# Patient Record
Sex: Female | Born: 1965 | Race: White | Hispanic: No | Smoking: Former smoker
Health system: Southern US, Community
[De-identification: ages and names within clinical notes are randomized; demographics above are authoritative.]

## PROBLEM LIST (undated history)

## (undated) DIAGNOSIS — K5900 Constipation, unspecified: Secondary | ICD-10-CM

## (undated) DIAGNOSIS — M1990 Unspecified osteoarthritis, unspecified site: Secondary | ICD-10-CM

## (undated) DIAGNOSIS — Z87442 Personal history of urinary calculi: Secondary | ICD-10-CM

## (undated) DIAGNOSIS — F419 Anxiety disorder, unspecified: Secondary | ICD-10-CM

## (undated) DIAGNOSIS — M549 Dorsalgia, unspecified: Secondary | ICD-10-CM

## (undated) DIAGNOSIS — M5412 Radiculopathy, cervical region: Secondary | ICD-10-CM

## (undated) DIAGNOSIS — I209 Angina pectoris, unspecified: Secondary | ICD-10-CM

## (undated) DIAGNOSIS — I340 Nonrheumatic mitral (valve) insufficiency: Secondary | ICD-10-CM

## (undated) DIAGNOSIS — K219 Gastro-esophageal reflux disease without esophagitis: Secondary | ICD-10-CM

## (undated) DIAGNOSIS — R569 Unspecified convulsions: Secondary | ICD-10-CM

## (undated) DIAGNOSIS — R072 Precordial pain: Secondary | ICD-10-CM

## (undated) DIAGNOSIS — B351 Tinea unguium: Secondary | ICD-10-CM

## (undated) DIAGNOSIS — N393 Stress incontinence (female) (male): Secondary | ICD-10-CM

## (undated) DIAGNOSIS — J309 Allergic rhinitis, unspecified: Secondary | ICD-10-CM

## (undated) DIAGNOSIS — R32 Unspecified urinary incontinence: Secondary | ICD-10-CM

## (undated) DIAGNOSIS — M722 Plantar fascial fibromatosis: Secondary | ICD-10-CM

## (undated) DIAGNOSIS — F32A Depression, unspecified: Secondary | ICD-10-CM

## (undated) DIAGNOSIS — M5030 Other cervical disc degeneration, unspecified cervical region: Secondary | ICD-10-CM

## (undated) DIAGNOSIS — F410 Panic disorder [episodic paroxysmal anxiety] without agoraphobia: Secondary | ICD-10-CM

## (undated) HISTORY — PX: TONSILLECTOMY: SUR1361

## (undated) HISTORY — PX: APPENDECTOMY: SHX54

## (undated) HISTORY — DX: Nonrheumatic mitral (valve) insufficiency: I34.0

## (undated) HISTORY — DX: Precordial pain: R07.2

## (undated) HISTORY — PX: OTHER SURGICAL HISTORY: SHX169

---
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
## 2001-08-18 HISTORY — PX: TUBAL LIGATION: SHX77

## 2004-11-14 ENCOUNTER — Ambulatory Visit: Payer: Self-pay | Admitting: Family Medicine

## 2004-12-18 ENCOUNTER — Emergency Department: Payer: Self-pay | Admitting: Unknown Physician Specialty

## 2005-12-25 ENCOUNTER — Ambulatory Visit: Payer: Self-pay | Admitting: Family Medicine

## 2007-02-23 ENCOUNTER — Ambulatory Visit: Payer: Self-pay | Admitting: Family Medicine

## 2008-02-21 DIAGNOSIS — J309 Allergic rhinitis, unspecified: Secondary | ICD-10-CM | POA: Insufficient documentation

## 2008-05-01 ENCOUNTER — Emergency Department: Payer: Self-pay | Admitting: Emergency Medicine

## 2009-09-20 ENCOUNTER — Emergency Department: Payer: Self-pay | Admitting: Emergency Medicine

## 2011-12-09 DIAGNOSIS — G40209 Localization-related (focal) (partial) symptomatic epilepsy and epileptic syndromes with complex partial seizures, not intractable, without status epilepticus: Secondary | ICD-10-CM | POA: Insufficient documentation

## 2013-02-03 ENCOUNTER — Ambulatory Visit: Payer: Self-pay

## 2013-12-29 DIAGNOSIS — M179 Osteoarthritis of knee, unspecified: Secondary | ICD-10-CM | POA: Insufficient documentation

## 2013-12-29 DIAGNOSIS — M1710 Unilateral primary osteoarthritis, unspecified knee: Secondary | ICD-10-CM | POA: Insufficient documentation

## 2014-03-18 ENCOUNTER — Emergency Department: Payer: Self-pay | Admitting: Emergency Medicine

## 2014-03-18 LAB — URINALYSIS, COMPLETE
BILIRUBIN, UR: NEGATIVE
BLOOD: NEGATIVE
Glucose,UR: NEGATIVE mg/dL (ref 0–75)
KETONE: NEGATIVE
Nitrite: NEGATIVE
PROTEIN: NEGATIVE
Ph: 5 (ref 4.5–8.0)
RBC,UR: 2 /HPF (ref 0–5)
Specific Gravity: 1.008 (ref 1.003–1.030)

## 2014-08-24 ENCOUNTER — Ambulatory Visit: Payer: Self-pay | Admitting: Family Medicine

## 2014-11-04 ENCOUNTER — Emergency Department: Payer: Self-pay | Admitting: Family Medicine

## 2014-11-12 ENCOUNTER — Emergency Department: Payer: Self-pay | Admitting: Internal Medicine

## 2014-11-25 ENCOUNTER — Emergency Department
Admit: 2014-11-25 | Disposition: A | Discharge status: Home / Self Care | Payer: Self-pay | Admitting: Physician Assistant

## 2014-11-25 LAB — CBC
HCT: 38.2 % (ref 35.0–47.0)
HGB: 12.5 g/dL (ref 12.0–16.0)
MCH: 29.5 pg (ref 26.0–34.0)
MCHC: 32.8 g/dL (ref 32.0–36.0)
MCV: 90 fL (ref 80–100)
PLATELETS: 326 10*3/uL (ref 150–440)
RBC: 4.25 10*6/uL (ref 3.80–5.20)
RDW: 13.3 % (ref 11.5–14.5)
WBC: 9.9 10*3/uL (ref 3.6–11.0)

## 2014-11-25 LAB — BASIC METABOLIC PANEL WITH GFR
Anion Gap: 6 — ABNORMAL LOW
BUN: 15 mg/dL
Calcium, Total: 9 mg/dL
Chloride: 109 mmol/L
Co2: 25 mmol/L
Creatinine: 0.89 mg/dL
EGFR (African American): >60
EGFR (Non-African Amer.): >60
Glucose: 113 mg/dL — ABNORMAL HIGH
Potassium: 3.9 mmol/L
Sodium: 140 mmol/L

## 2014-11-25 LAB — TROPONIN I

## 2015-01-07 ENCOUNTER — Encounter: Payer: Self-pay | Admitting: Emergency Medicine

## 2015-01-07 ENCOUNTER — Emergency Department
Admission: EM | Admit: 2015-01-07 | Discharge: 2015-01-07 | Disposition: A | Discharge status: Home / Self Care | Payer: Medicare Other | Attending: Emergency Medicine | Admitting: Emergency Medicine

## 2015-01-07 ENCOUNTER — Emergency Department: Payer: Medicare Other

## 2015-01-07 DIAGNOSIS — G8929 Other chronic pain: Secondary | ICD-10-CM

## 2015-01-07 DIAGNOSIS — M25511 Pain in right shoulder: Secondary | ICD-10-CM | POA: Insufficient documentation

## 2015-01-07 DIAGNOSIS — Z87891 Personal history of nicotine dependence: Secondary | ICD-10-CM | POA: Diagnosis not present

## 2015-01-07 HISTORY — DX: Unspecified convulsions: R56.9

## 2015-01-07 MED ORDER — ACETAMINOPHEN-CODEINE #3 300-30 MG PO TABS: 1.0000 | ORAL_TABLET | ORAL | Status: DC | PRN

## 2015-01-07 MED ORDER — OXYCODONE HCL 5 MG PO TABS
ORAL_TABLET | ORAL | Status: AC
  Administered 2015-01-07: 5 mg via ORAL
  Filled 2015-01-07: qty 1

## 2015-01-07 MED ORDER — OXYCODONE HCL 5 MG PO TABS
5.0000 mg | ORAL_TABLET | Freq: Once | ORAL | Status: AC
  Administered 2015-01-07: 5 mg via ORAL

## 2015-01-07 MED ORDER — ETODOLAC 500 MG PO TABS: 500.0000 mg | ORAL_TABLET | Freq: Two times a day (BID) | ORAL | Status: DC

## 2015-01-07 NOTE — ED Notes (Signed)
Pt reports right arm pain that started in January. States she fell in January but pain started a couple months after. States she has been seen 3 times her prior for same pain.

## 2015-01-08 NOTE — ED Provider Notes (Signed)
[facility] Emergency Department Provider Note  ____________________________________________  Time seen: Approximately 6:27 PM  I have reviewed the triage vital signs and the nursing notes.   HISTORY  Chief Complaint Arm Pain    HPI Cindy Mcclure is a 49 y.o. female who presents to the emergency department for chronic right shoulder and scapula pain. She has been seeing orthopedics however she states that the hydrocodone and they are prescribing is no longer helping. She states that no one has done an x-ray since she fell in January.   Past Medical History  Diagnosis Date  . Seizures     There are no active problems to display for this patient.   History reviewed. No pertinent past surgical history.  Current Outpatient Rx  Name  Route  Sig  Dispense  Refill  . acetaminophen-codeine (TYLENOL #3) 300-30 MG per tablet   Oral   Take 1 tablet by mouth every 4 (four) hours as needed for moderate pain.   12 tablet   0   . etodolac (LODINE) 500 MG tablet   Oral   Take 1 tablet (500 mg total) by mouth 2 (two) times daily.   30 tablet   0     Allergies Review of patient's allergies indicates no known allergies.  No family history on file.  Social History History  Substance Use Topics  . Smoking status: Former Research scientist (life sciences)  . Smokeless tobacco: Not on file  . Alcohol Use: Yes     Comment: occasionally    Review of Systems Constitutional: No recent illness. Eyes: No visual changes. ENT: No sore throat. Cardiovascular: Denies chest pain or palpitations. Respiratory: Denies shortness of breath. Gastrointestinal: No abdominal pain.  Genitourinary: Negative for dysuria. Musculoskeletal: Pain in right scapula and shoulder Skin: Negative for rash. Neurological: Negative for headaches, focal weakness or numbness. 10-point ROS otherwise negative.  ____________________________________________   PHYSICAL EXAM:  VITAL SIGNS: ED Triage Vitals   Enc Vitals Group     BP 01/07/15 1649 104/75 mmHg     Pulse Rate 01/07/15 1649 106     Resp 01/07/15 1649 20     Temp 01/07/15 1649 98.4 F (36.9 C)     Temp Source 01/07/15 1649 Oral     SpO2 01/07/15 1649 97 %     Weight 01/07/15 1649 189 lb (85.73 kg)     Height 01/07/15 1649 4\' 11"  (1.499 m)     Head Cir --      Peak Flow --      Pain Score 01/07/15 1653 3     Pain Loc --      Pain Edu? --      Excl. in [place]? --     Constitutional: Alert and oriented. Well appearing and in no acute distress. Eyes: Conjunctivae are normal. EOMI. Head: Atraumatic. Nose: No congestion/rhinnorhea. Neck: No stridor.  Respiratory: Normal respiratory effort.   Musculoskeletal: Tender over her scapula and right shoulder without step-off deformity or trauma. Neurologic:  Normal speech and language. No gross focal neurologic deficits are appreciated. Speech is normal. No gait instability. Skin:  Skin is warm, dry and intact. Atraumatic. Psychiatric: Mood and affect are normal. Speech and behavior are normal.  ____________________________________________   LABS (all labs ordered are listed, but only abnormal results are displayed)  Labs Reviewed - No data to display ____________________________________________  RADIOLOGY  Negative shoulder and scapula x-rays ____________________________________________   PROCEDURES  Procedure(s) performed: None   ____________________________________________   INITIAL IMPRESSION /  ASSESSMENT AND PLAN / ED COURSE  Pertinent labs & imaging results that were available during my care of the patient were reviewed by me and considered in my medical decision making (see chart for details).  Patient was advised she needs to follow up with her orthopedic doctor. She was advised that the emergency department does not manage chronic pain. She was encouraged to return to the emergency department for new  concerns ____________________________________________   FINAL CLINICAL IMPRESSION(S) / ED DIAGNOSES  Final diagnoses:  Chronic shoulder pain, right      Victorino Dike, FNP 01/08/15 0025  Harvest Dark, MD 01/08/15 [phone]

## 2015-01-10 ENCOUNTER — Other Ambulatory Visit: Payer: Self-pay | Admitting: Orthopedic Surgery

## 2015-01-10 DIAGNOSIS — M5412 Radiculopathy, cervical region: Secondary | ICD-10-CM

## 2015-01-18 DIAGNOSIS — Z5181 Encounter for therapeutic drug level monitoring: Secondary | ICD-10-CM | POA: Insufficient documentation

## 2015-01-19 ENCOUNTER — Ambulatory Visit
Admission: RE | Admit: 2015-01-19 | Discharge: 2015-01-19 | Disposition: A | Discharge status: Home / Self Care | Payer: Medicare Other | Source: Ambulatory Visit | Attending: Orthopedic Surgery | Admitting: Orthopedic Surgery

## 2015-01-19 DIAGNOSIS — M5412 Radiculopathy, cervical region: Secondary | ICD-10-CM | POA: Insufficient documentation

## 2015-01-19 DIAGNOSIS — M4802 Spinal stenosis, cervical region: Secondary | ICD-10-CM | POA: Diagnosis not present

## 2015-04-20 DIAGNOSIS — M5412 Radiculopathy, cervical region: Secondary | ICD-10-CM | POA: Insufficient documentation

## 2015-04-20 DIAGNOSIS — M5030 Other cervical disc degeneration, unspecified cervical region: Secondary | ICD-10-CM | POA: Insufficient documentation

## 2015-04-26 DIAGNOSIS — F418 Other specified anxiety disorders: Secondary | ICD-10-CM | POA: Insufficient documentation

## 2015-07-09 ENCOUNTER — Emergency Department: Payer: Medicare Other

## 2015-07-09 ENCOUNTER — Encounter: Payer: Self-pay | Admitting: *Deleted

## 2015-07-09 ENCOUNTER — Emergency Department
Admission: EM | Admit: 2015-07-09 | Discharge: 2015-07-10 | Disposition: A | Discharge status: Home / Self Care | Payer: Medicare Other | Attending: Emergency Medicine | Admitting: Emergency Medicine

## 2015-07-09 DIAGNOSIS — Y998 Other external cause status: Secondary | ICD-10-CM | POA: Diagnosis not present

## 2015-07-09 DIAGNOSIS — S8992XA Unspecified injury of left lower leg, initial encounter: Secondary | ICD-10-CM | POA: Diagnosis present

## 2015-07-09 DIAGNOSIS — Y9389 Activity, other specified: Secondary | ICD-10-CM | POA: Insufficient documentation

## 2015-07-09 DIAGNOSIS — M25562 Pain in left knee: Secondary | ICD-10-CM

## 2015-07-09 DIAGNOSIS — Z791 Long term (current) use of non-steroidal anti-inflammatories (NSAID): Secondary | ICD-10-CM | POA: Diagnosis not present

## 2015-07-09 DIAGNOSIS — Z79899 Other long term (current) drug therapy: Secondary | ICD-10-CM | POA: Insufficient documentation

## 2015-07-09 DIAGNOSIS — S99912A Unspecified injury of left ankle, initial encounter: Secondary | ICD-10-CM | POA: Insufficient documentation

## 2015-07-09 DIAGNOSIS — W010XXA Fall on same level from slipping, tripping and stumbling without subsequent striking against object, initial encounter: Secondary | ICD-10-CM | POA: Diagnosis not present

## 2015-07-09 DIAGNOSIS — M25572 Pain in left ankle and joints of left foot: Secondary | ICD-10-CM

## 2015-07-09 DIAGNOSIS — Y9289 Other specified places as the place of occurrence of the external cause: Secondary | ICD-10-CM | POA: Diagnosis not present

## 2015-07-09 DIAGNOSIS — Z87891 Personal history of nicotine dependence: Secondary | ICD-10-CM | POA: Insufficient documentation

## 2015-07-09 HISTORY — DX: Unspecified osteoarthritis, unspecified site: M19.90

## 2015-07-09 HISTORY — DX: Plantar fascial fibromatosis: M72.2

## 2015-07-09 MED ORDER — IBUPROFEN 600 MG PO TABS
600.0000 mg | ORAL_TABLET | Freq: Once | ORAL | Status: AC
  Administered 2015-07-10: 600 mg via ORAL
  Filled 2015-07-09: qty 1

## 2015-07-09 NOTE — ED Notes (Signed)
Pt fell today now co left knee and left ankle pain, pt ambulatory to triage.

## 2015-07-10 DIAGNOSIS — S8992XA Unspecified injury of left lower leg, initial encounter: Secondary | ICD-10-CM | POA: Diagnosis not present

## 2015-07-10 NOTE — ED Notes (Signed)
Education provided on crutches use and application of knee immobilizer. Pt demonstrated use of crutches and reports feeling comfortable with them.

## 2015-07-10 NOTE — Discharge Instructions (Signed)
Ankle Pain Ankle pain is a common symptom. The bones, cartilage, tendons, and muscles of the ankle joint perform a lot of work each day. The ankle joint holds your body weight and allows you to move around. Ankle pain can occur on either side or back of 1 or both ankles. Ankle pain may be sharp and burning or dull and aching. There may be tenderness, stiffness, redness, or warmth around the ankle. The pain occurs more often when a person walks or puts pressure on the ankle. CAUSES  There are many reasons ankle pain can develop. It is important to work with your caregiver to identify the cause since many conditions can impact the bones, cartilage, muscles, and tendons. Causes for ankle pain include:  Injury, including a break (fracture), sprain, or strain often due to a fall, sports, or a high-impact activity.  Swelling (inflammation) of a tendon (tendonitis).  Achilles tendon rupture.  Ankle instability after repeated sprains and strains.  Poor foot alignment.  Pressure on a nerve (tarsal tunnel syndrome).  Arthritis in the ankle or the lining of the ankle.  Crystal formation in the ankle (gout or pseudogout). DIAGNOSIS  A diagnosis is based on your medical history, your symptoms, results of your physical exam, and results of diagnostic tests. Diagnostic tests may include X-ray exams or a computerized magnetic scan (magnetic resonance imaging, MRI). TREATMENT  Treatment will depend on the cause of your ankle pain and may include:  Keeping pressure off the ankle and limiting activities.  Using crutches or other walking support (a cane or brace).  Using rest, ice, compression, and elevation.  Participating in physical therapy or home exercises.  Wearing shoe inserts or special shoes.  Losing weight.  Taking medications to reduce pain or swelling or receiving an injection.  Undergoing surgery. HOME CARE INSTRUCTIONS   Only take over-the-counter or prescription medicines for  pain, discomfort, or fever as directed by your caregiver.  Put ice on the injured area.  Put ice in a plastic bag.  Place a towel between your skin and the bag.  Leave the ice on for 15-20 minutes at a time, 03-04 times a day.  Keep your leg raised (elevated) when possible to lessen swelling.  Avoid activities that cause ankle pain.  Follow specific exercises as directed by your caregiver.  Record how often you have ankle pain, the location of the pain, and what it feels like. This information may be helpful to you and your caregiver.  Ask your caregiver about returning to work or sports and whether you should drive.  Follow up with your caregiver for further examination, therapy, or testing as directed. SEEK MEDICAL CARE IF:   Pain or swelling continues or worsens beyond 1 week.  You have an oral temperature above 102 F (38.9 C).  You are feeling unwell or have chills.  You are having an increasingly difficult time with walking.  You have loss of sensation or other new symptoms.  You have questions or concerns. MAKE SURE YOU:   Understand these instructions.  Will watch your condition.  Will get help right away if you are not doing well or get worse.   This information is not intended to replace advice given to you by your health care provider. Make sure you discuss any questions you have with your health care provider.   Document Released: 01/22/2010 Document Revised: 10/27/2011 Document Reviewed: 03/06/2015 Elsevier Interactive Patient Education 2016 [employer]  Knee Pain Knee pain is a very common  symptom and can have many causes. Knee pain often goes away when you follow your health care provider's instructions for relieving pain and discomfort at home. However, knee pain can develop into a condition that needs treatment. Some conditions may include:  Arthritis caused by wear and tear (osteoarthritis).  Arthritis caused by swelling and irritation  (rheumatoid arthritis or gout).  A cyst or growth in your knee.  An infection in your knee joint.  An injury that will not heal.  Damage, swelling, or irritation of the tissues that support your knee (torn ligaments or tendinitis). If your knee pain continues, additional tests may be ordered to diagnose your condition. Tests may include X-rays or other imaging studies of your knee. You may also need to have fluid removed from your knee. Treatment for ongoing knee pain depends on the cause, but treatment may include:  Medicines to relieve pain or swelling.  Steroid injections in your knee.  Physical therapy.  Surgery. HOME CARE INSTRUCTIONS  Take medicines only as directed by your health care provider.  Rest your knee and keep it raised (elevated) while you are resting.  Do not do things that cause or worsen pain.  Avoid high-impact activities or exercises, such as running, jumping rope, or doing jumping jacks.  Apply ice to the knee area:  Put ice in a plastic bag.  Place a towel between your skin and the bag.  Leave the ice on for 20 minutes, 2-3 times a day.  Ask your health care provider if you should wear an elastic knee support.  Keep a pillow under your knee when you sleep.  Lose weight if you are overweight. Extra weight can put pressure on your knee.  Do not use any tobacco products, including cigarettes, chewing tobacco, or electronic cigarettes. If you need help quitting, ask your health care provider. Smoking may slow the healing of any bone and joint problems that you may have. SEEK MEDICAL CARE IF:  Your knee pain continues, changes, or gets worse.  You have a fever along with knee pain.  Your knee buckles or locks up.  Your knee becomes more swollen. SEEK IMMEDIATE MEDICAL CARE IF:   Your knee joint feels hot to the touch.  You have chest pain or trouble breathing.   This information is not intended to replace advice given to you by your health  care provider. Make sure you discuss any questions you have with your health care provider.   Document Released: 06/01/2007 Document Revised: 08/25/2014 Document Reviewed: 03/20/2014 Elsevier Interactive Patient Education [employer].

## 2015-07-10 NOTE — ED Provider Notes (Signed)
[facility] Emergency Department Provider Note  ____________________________________________  Time seen: Approximately 2331 PM  I have reviewed the triage vital signs and the nursing notes.   HISTORY  Chief Complaint Fall    HPI Cindy Mcclure is a 49 y.o. female comes into the hospital today with a fall. The patient reports that she was wearing foot is on a slick floor and her leg slipped out from under her. The patient reports that she has pain in her left knee when she walks but she is able to walk. She landed on her bottom. The patient also has some pain in her left ankle. This occurred at 2300. The patient did not take anything for pain nor has she put ice on her knee or ankle. The patient denies any swelling that does have a history of arthritis. She reports that it hurt when she was getting up. Her pain as a 3-4 out of 10 in intensity. Her ankle pain is not as bad as her knee and she denies hitting her head. The patient came in for further evaluation of her pain.The patient's daughter wrapped her left ankle.   Past Medical History  Diagnosis Date  . Seizures ([place])   . Arthritis   . Plantar fasciitis     bilaterally    There are no active problems to display for this patient.   History reviewed. No pertinent past surgical history.  Current Outpatient Rx  Name  Route  Sig  Dispense  Refill  . cyclobenzaprine (FLEXERIL) 10 MG tablet   Oral   Take 10 mg by mouth at bedtime.         . furosemide (LASIX) 20 MG tablet   Oral   Take 20 mg by mouth once a week.         . gabapentin (NEURONTIN) 300 MG capsule   Oral   Take 300 mg by mouth at bedtime.         . lamoTRIgine (LAMICTAL) 200 MG tablet   Oral   Take 300 mg by mouth daily with supper.         . levETIRAcetam (KEPPRA) 750 MG tablet   Oral   Take 750 mg by mouth 5 (five) times daily.         Marland Kitchen loratadine (CLARITIN) 10 MG tablet   Oral   Take 10 mg by mouth daily.          Marland Kitchen zonisamide (ZONEGRAN) 100 MG capsule   Oral   Take 400 mg by mouth daily with supper.         Marland Kitchen acetaminophen-codeine (TYLENOL #3) 300-30 MG per tablet   Oral   Take 1 tablet by mouth every 4 (four) hours as needed for moderate pain.   12 tablet   0   . etodolac (LODINE) 500 MG tablet   Oral   Take 1 tablet (500 mg total) by mouth 2 (two) times daily.   30 tablet   0     Allergies Review of patient's allergies indicates no known allergies.  History reviewed. No pertinent family history.  Social History Social History  Substance Use Topics  . Smoking status: Former Research scientist (life sciences)  . Smokeless tobacco: None  . Alcohol Use: Yes     Comment: occasionally    Review of Systems Constitutional: No fever/chills Eyes: No visual changes. ENT: No sore throat. Cardiovascular: Denies chest pain. Respiratory: Denies shortness of breath. Gastrointestinal: No abdominal pain.  No nausea, no vomiting.  No diarrhea.  No constipation. Genitourinary: Negative for dysuria. Musculoskeletal: Left knee pain, left ankle pain Skin: Negative for rash. Neurological: Negative for headaches, focal weakness or numbness.  10-point ROS otherwise negative.  ____________________________________________   PHYSICAL EXAM:  VITAL SIGNS: ED Triage Vitals  Enc Vitals Group     BP 07/09/15 2306 100/66 mmHg     Pulse Rate 07/09/15 2306 92     Resp 07/09/15 2306 18     Temp 07/09/15 2306 98.1 F (36.7 C)     Temp Source 07/09/15 2306 Oral     SpO2 07/09/15 2306 93 %     Weight 07/09/15 2306 178 lb (80.74 kg)     Height 07/09/15 2306 5' (1.524 m)     Head Cir --      Peak Flow --      Pain Score 07/09/15 2307 4     Pain Loc --      Pain Edu? --      Excl. in [place]? --     Constitutional: Alert and oriented. Well appearing and in no acute distress. Eyes: Conjunctivae are normal. PERRL. EOMI. Head: Atraumatic. Nose: No congestion/rhinnorhea. Mouth/Throat: Mucous membranes are moist.   Oropharynx non-erythematous. Neck: No cervical spine tenderness to palpation. Cardiovascular: Normal rate, regular rhythm. Grossly normal heart sounds.  Good peripheral circulation. Respiratory: Normal respiratory effort.  No retractions. Lungs CTAB. Gastrointestinal: Soft and nontender. No distention. No abdominal bruits. No CVA tenderness. Musculoskeletal: No lower extremity tenderness nor edema. Mild pain to palpation along the lateral joint line, mild pain with flexion of the knee. No pain with range of motion of ankle.  Neurologic:  Normal speech and language.  Skin:  Skin is warm, dry and intact.  Psychiatric: Mood and affect are normal.   ____________________________________________   LABS (all labs ordered are listed, but only abnormal results are displayed)  Labs Reviewed - No data to display ____________________________________________  EKG  None ____________________________________________  RADIOLOGY  Left knee x-ray: Negative Ankle x-ray: Active ____________________________________________   PROCEDURES  Procedure(s) performed: None  Critical Care performed: No  ____________________________________________   INITIAL IMPRESSION / ASSESSMENT AND PLAN / ED COURSE  Pertinent labs & imaging results that were available during my care of the patient were reviewed by me and considered in my medical decision making (see chart for details).  This is a 49 year old female who fell today and comes in with left knee and ankle pain. I did give the patient dose of ibuprofen and x-rays are negative. I feel the patient may have a sprain to her knee which is causing the pain but I will give her knee immobilizer and put her on some crutches. I'll have the patient follow up with orthopedic surgery for further evaluation should the pain continue. The patient has no further complaints or concerns that she'll be discharged home. ____________________________________________   FINAL  CLINICAL IMPRESSION(S) / ED DIAGNOSES  Final diagnoses:  Left knee pain  Ankle pain, left      Loney Hering, MD 07/10/15 [id number]

## 2015-12-05 ENCOUNTER — Emergency Department: Payer: Medicare Other

## 2015-12-05 ENCOUNTER — Inpatient Hospital Stay
Admission: EM | Admit: 2015-12-05 | Discharge: 2015-12-08 | DRG: 419 | Disposition: A | Discharge status: Home / Self Care | Payer: Medicare Other | Attending: Surgery | Admitting: Surgery

## 2015-12-05 ENCOUNTER — Encounter: Payer: Self-pay | Admitting: Emergency Medicine

## 2015-12-05 DIAGNOSIS — K8000 Calculus of gallbladder with acute cholecystitis without obstruction: Secondary | ICD-10-CM | POA: Diagnosis not present

## 2015-12-05 DIAGNOSIS — Z419 Encounter for procedure for purposes other than remedying health state, unspecified: Secondary | ICD-10-CM

## 2015-12-05 DIAGNOSIS — G40909 Epilepsy, unspecified, not intractable, without status epilepticus: Secondary | ICD-10-CM | POA: Diagnosis present

## 2015-12-05 DIAGNOSIS — M1990 Unspecified osteoarthritis, unspecified site: Secondary | ICD-10-CM | POA: Diagnosis present

## 2015-12-05 DIAGNOSIS — K819 Cholecystitis, unspecified: Secondary | ICD-10-CM | POA: Diagnosis not present

## 2015-12-05 DIAGNOSIS — R1011 Right upper quadrant pain: Secondary | ICD-10-CM

## 2015-12-05 DIAGNOSIS — Z79899 Other long term (current) drug therapy: Secondary | ICD-10-CM

## 2015-12-05 DIAGNOSIS — Z87891 Personal history of nicotine dependence: Secondary | ICD-10-CM

## 2015-12-05 DIAGNOSIS — M722 Plantar fascial fibromatosis: Secondary | ICD-10-CM | POA: Diagnosis present

## 2015-12-05 DIAGNOSIS — K8010 Calculus of gallbladder with chronic cholecystitis without obstruction: Secondary | ICD-10-CM | POA: Diagnosis present

## 2015-12-05 LAB — BASIC METABOLIC PANEL
Anion gap: 8 (ref 5–15)
BUN: 11 mg/dL (ref 6–20)
CHLORIDE: 105 mmol/L (ref 101–111)
CO2: 23 mmol/L (ref 22–32)
CREATININE: 0.96 mg/dL (ref 0.44–1.00)
Calcium: 9 mg/dL (ref 8.9–10.3)
GFR calc Af Amer: >60 mL/min (ref >60)
GFR calc non Af Amer: >60 mL/min (ref >60)
Glucose, Bld: 105 mg/dL — ABNORMAL HIGH (ref 65–99)
Potassium: 3.9 mmol/L (ref 3.5–5.1)
Sodium: 136 mmol/L (ref 135–145)

## 2015-12-05 LAB — CBC
HCT: 38.2 % (ref 35.0–47.0)
Hemoglobin: 12.6 g/dL (ref 12.0–16.0)
MCH: 29.1 pg (ref 26.0–34.0)
MCHC: 33 g/dL (ref 32.0–36.0)
MCV: 88.2 fL (ref 80.0–100.0)
PLATELETS: 314 10*3/uL (ref 150–440)
RBC: 4.34 MIL/uL (ref 3.80–5.20)
RDW: 14.2 % (ref 11.5–14.5)
WBC: 24.8 10*3/uL — ABNORMAL HIGH (ref 3.6–11.0)

## 2015-12-05 LAB — TROPONIN I: Troponin I: <0.03 ng/mL (ref <0.031)

## 2015-12-05 MED ORDER — PIPERACILLIN-TAZOBACTAM 3.375 G IVPB
3.3750 g | Freq: Once | INTRAVENOUS | Status: AC
  Administered 2015-12-05: 3.375 g via INTRAVENOUS
  Filled 2015-12-05: qty 50

## 2015-12-05 NOTE — ED Provider Notes (Signed)
[facility] Emergency Department Provider Note  ____________________________________________  Time seen: 10:50 PM  I have reviewed the triage vital signs and the nursing notes.   HISTORY  Chief Complaint Chest Pain      HPI Cindy Mcclure is a 50 y.o. female presents with currently 4 out of 10 epigastric discomfort accompanied by nausea however no vomiting times one day. Patient states at maximum intensity of pain was 9 out of 10. She denies any dyspnea no lower extremity pain or swelling. Patient denies any diaphoresis or dizziness.     Past Medical History  Diagnosis Date  . Seizures ([place])   . Arthritis   . Plantar fasciitis     bilaterally    There are no active problems to display for this patient.   Past surgical history None  Current Outpatient Rx  Name  Route  Sig  Dispense  Refill  . acetaminophen-codeine (TYLENOL #3) 300-30 MG per tablet   Oral   Take 1 tablet by mouth every 4 (four) hours as needed for moderate pain.   12 tablet   0   . cyclobenzaprine (FLEXERIL) 10 MG tablet   Oral   Take 10 mg by mouth at bedtime.         Marland Kitchen etodolac (LODINE) 500 MG tablet   Oral   Take 1 tablet (500 mg total) by mouth 2 (two) times daily.   30 tablet   0   . furosemide (LASIX) 20 MG tablet   Oral   Take 20 mg by mouth once a week.         . gabapentin (NEURONTIN) 300 MG capsule   Oral   Take 300 mg by mouth at bedtime.         . lamoTRIgine (LAMICTAL) 200 MG tablet   Oral   Take 300 mg by mouth daily with supper.         . levETIRAcetam (KEPPRA) 750 MG tablet   Oral   Take 750 mg by mouth 5 (five) times daily.         Marland Kitchen loratadine (CLARITIN) 10 MG tablet   Oral   Take 10 mg by mouth daily.         Marland Kitchen zonisamide (ZONEGRAN) 100 MG capsule   Oral   Take 400 mg by mouth daily with supper.           Allergies No known drug allergies  No family history on file.  Social History Social History  Substance  Use Topics  . Smoking status: Former Research scientist (life sciences)  . Smokeless tobacco: None  . Alcohol Use: Yes     Comment: occasionally    Review of Systems  Constitutional: Negative for fever. Eyes: Negative for visual changes. ENT: Negative for sore throat. Cardiovascular: Negative for chest pain. Respiratory: Negative for shortness of breath. Gastrointestinal: Negative for abdominal pain, vomiting and diarrhea. Genitourinary: Negative for dysuria. Musculoskeletal: Negative for back pain. Skin: Negative for rash. Neurological: Negative for headaches, focal weakness or numbness.   10-point ROS otherwise negative.  ____________________________________________   PHYSICAL EXAM:  VITAL SIGNS: ED Triage Vitals  Enc Vitals Group     BP 12/05/15 2041 111/67 mmHg     Pulse Rate 12/05/15 2041 116     Resp 12/05/15 2153 22     Temp 12/05/15 2041 98 F (36.7 C)     Temp Source 12/05/15 2041 Oral     SpO2 12/05/15 2041 97 %     Weight 12/05/15  2041 179 lb (81.194 kg)     Height 12/05/15 2041 5' (1.524 m)     Head Cir --      Peak Flow --      Pain Score 12/05/15 2040 9     Pain Loc --      Pain Edu? --      Excl. in [place]? --      Constitutional: Alert and oriented. Well appearing and in no distress. Eyes: Conjunctivae are normal. PERRL. Normal extraocular movements. ENT   Head: Normocephalic and atraumatic.   Nose: No congestion/rhinnorhea.   Mouth/Throat: Mucous membranes are moist.   Neck: No stridor. Hematological/Lymphatic/Immunilogical: No cervical lymphadenopathy. Cardiovascular: Normal rate, regular rhythm. Normal and symmetric distal pulses are present in all extremities. No murmurs, rubs, or gallops. Respiratory: Normal respiratory effort without tachypnea nor retractions. Breath sounds are clear and equal bilaterally. No wheezes/rales/rhonchi. Gastrointestinal: Right upper quadrant tenderness to palpation. No distention. There is no CVA tenderness. Genitourinary:  deferred Musculoskeletal: Nontender with normal range of motion in all extremities. No joint effusions.  No lower extremity tenderness nor edema. Neurologic:  Normal speech and language. No gross focal neurologic deficits are appreciated. Speech is normal.  Skin:  Skin is warm, dry and intact. No rash noted. Psychiatric: Mood and affect are normal. Speech and behavior are normal. Patient exhibits appropriate insight and judgment.  ____________________________________________    LABS (pertinent positives/negatives)  Labs Reviewed  BASIC METABOLIC PANEL - Abnormal; Notable for the following:    Glucose, Bld 105 (*)    All other components within normal limits  CBC - Abnormal; Notable for the following:    WBC 24.8 (*)    All other components within normal limits  TROPONIN I  URINALYSIS COMPLETEWITH MICROSCOPIC ([facility] ONLY)     ____________________________________________   EKG  ED ECG REPORT I, [place] N BROWN, the attending physician, personally viewed and interpreted this ECG.   Date: 12/06/2015  EKG Time: 8:39 PM  Rate: 118  Rhythm: sinus tachycardia  Axis: Normal  Intervals: Normal   ST&T Change: none   ____________________________________________    RADIOLOGY     US Abdomen Limited RUQ (In process)    Procedure changed from US Abdomen Limited         DG Chest 2 View (Final result) Result time: 12/05/15 21:11:07   Final result by Rad Results In Interface (12/05/15 21:11:07)   Narrative:   CLINICAL DATA: Upper mid chest pain with tenderness in both breasts since Tuesday. No injury.  EXAM: CHEST 2 VIEW  COMPARISON: None.  FINDINGS: Linear atelectasis in the lung bases. Normal heart size and pulmonary vascularity. No focal airspace disease or consolidation in the lungs. No blunting of costophrenic angles. No pneumothorax. Mediastinal contours appear intact.  IMPRESSION: Mild linear atelectasis in the lung bases.   Electronically  Signed By: Lucienne Capers M.D. On: 12/05/2015 21:11      US Abdomen Limited RUQ (Final result) Result time: 12/06/15 11:11:25   Procedure changed from US Abdomen Limited      Final result by Rad Results In Interface (12/06/15 11:11:25)   Narrative:   CLINICAL DATA: Right upper quadrant abdominal pain beginning yesterday morning.  EXAM: US ABDOMEN LIMITED - RIGHT UPPER QUADRANT  COMPARISON: None.  FINDINGS: Gallbladder:  There multiple gallstones that lie dependently within the gallbladder. Gallbladder is moderately distended. Wall is borderline thickened measuring 3.4 mm. Mild wall edema is suggested.  Common bile duct:  Diameter: 4.7 mm  Liver:  No focal lesion  identified. Within normal limits in parenchymal echogenicity.  IMPRESSION: 1. Multiple gallstones and borderline thickened gallbladder wall. Findings support early acute cholecystitis in the proper clinical setting.   Electronically Signed By: Lajean Manes M.D. On: 12/06/2015 11:11     _   INITIAL IMPRESSION / ASSESSMENT AND PLAN / ED COURSE  Pertinent labs & imaging results that were available during my care of the patient were reviewed by me and considered in my medical decision making (see chart for details).  Patient discussed with Dr.Pabon general surgeon on call for hospital admission for further management. Patient received IV Zosyn 3.375 mg in the emergency department  ____________________________________________   FINAL CLINICAL IMPRESSION(S) / ED DIAGNOSES  Final diagnoses:  Cholecystitis      Gregor Hams, MD 12/06/15 2248

## 2015-12-05 NOTE — ED Notes (Signed)
Patient ambulatory to triage with steady gait, without difficulty or distress noted; pt reports upper chest pain, nonradiating, with no accomp symptoms; denies hx of same

## 2015-12-06 ENCOUNTER — Inpatient Hospital Stay: Payer: Medicare Other | Admitting: Certified Registered Nurse Anesthetist

## 2015-12-06 ENCOUNTER — Inpatient Hospital Stay: Payer: Medicare Other

## 2015-12-06 ENCOUNTER — Encounter: Payer: Self-pay | Admitting: *Deleted

## 2015-12-06 ENCOUNTER — Encounter
Admission: EM | Disposition: A | Discharge status: Home / Self Care | Payer: Self-pay | Source: Home / Self Care | Attending: Surgery

## 2015-12-06 DIAGNOSIS — K819 Cholecystitis, unspecified: Secondary | ICD-10-CM | POA: Diagnosis present

## 2015-12-06 DIAGNOSIS — M1990 Unspecified osteoarthritis, unspecified site: Secondary | ICD-10-CM | POA: Diagnosis present

## 2015-12-06 DIAGNOSIS — K8000 Calculus of gallbladder with acute cholecystitis without obstruction: Secondary | ICD-10-CM | POA: Diagnosis present

## 2015-12-06 DIAGNOSIS — G40909 Epilepsy, unspecified, not intractable, without status epilepticus: Secondary | ICD-10-CM | POA: Diagnosis present

## 2015-12-06 DIAGNOSIS — K8010 Calculus of gallbladder with chronic cholecystitis without obstruction: Secondary | ICD-10-CM | POA: Diagnosis present

## 2015-12-06 DIAGNOSIS — Z79899 Other long term (current) drug therapy: Secondary | ICD-10-CM | POA: Diagnosis not present

## 2015-12-06 DIAGNOSIS — M722 Plantar fascial fibromatosis: Secondary | ICD-10-CM | POA: Diagnosis present

## 2015-12-06 DIAGNOSIS — Z87891 Personal history of nicotine dependence: Secondary | ICD-10-CM | POA: Diagnosis not present

## 2015-12-06 HISTORY — PX: CHOLECYSTECTOMY: SHX55

## 2015-12-06 LAB — COMPREHENSIVE METABOLIC PANEL
ALT: 14 U/L (ref 14–54)
ANION GAP: 8 (ref 5–15)
AST: 17 U/L (ref 15–41)
Albumin: 3.6 g/dL (ref 3.5–5.0)
Alkaline Phosphatase: 116 U/L (ref 38–126)
BUN: 11 mg/dL (ref 6–20)
CALCIUM: 8.7 mg/dL — AB (ref 8.9–10.3)
CHLORIDE: 107 mmol/L (ref 101–111)
CO2: 22 mmol/L (ref 22–32)
CREATININE: 0.9 mg/dL (ref 0.44–1.00)
Glucose, Bld: 106 mg/dL — ABNORMAL HIGH (ref 65–99)
Potassium: 3.5 mmol/L (ref 3.5–5.1)
SODIUM: 137 mmol/L (ref 135–145)
Total Bilirubin: 0.6 mg/dL (ref 0.3–1.2)
Total Protein: 7.1 g/dL (ref 6.5–8.1)

## 2015-12-06 LAB — HEPATIC FUNCTION PANEL
ALK PHOS: 126 U/L (ref 38–126)
ALT: 16 U/L (ref 14–54)
AST: 20 U/L (ref 15–41)
Albumin: 3.9 g/dL (ref 3.5–5.0)
BILIRUBIN INDIRECT: 0.6 mg/dL (ref 0.3–0.9)
BILIRUBIN TOTAL: 0.7 mg/dL (ref 0.3–1.2)
Bilirubin, Direct: 0.1 mg/dL (ref 0.1–0.5)
TOTAL PROTEIN: 7.1 g/dL (ref 6.5–8.1)

## 2015-12-06 LAB — SURGICAL PCR SCREEN
MRSA, PCR: NEGATIVE
STAPHYLOCOCCUS AUREUS: POSITIVE — AB

## 2015-12-06 SURGERY — LAPAROSCOPIC CHOLECYSTECTOMY WITH INTRAOPERATIVE CHOLANGIOGRAM
Anesthesia: General | Wound class: Clean Contaminated

## 2015-12-06 MED ORDER — ONDANSETRON HCL 4 MG/2ML IJ SOLN
INTRAMUSCULAR | Status: DC | PRN
  Administered 2015-12-06 (×2): 4 mg via INTRAVENOUS

## 2015-12-06 MED ORDER — MORPHINE SULFATE (PF) 4 MG/ML IV SOLN
4.0000 mg | INTRAVENOUS | Status: DC | PRN
  Administered 2015-12-06 – 2015-12-07 (×3): 4 mg via INTRAVENOUS
  Filled 2015-12-06 (×3): qty 1

## 2015-12-06 MED ORDER — HEPARIN SODIUM (PORCINE) 5000 UNIT/ML IJ SOLN
5000.0000 [IU] | Freq: Three times a day (TID) | INTRAMUSCULAR | Status: DC
  Administered 2015-12-06 – 2015-12-08 (×5): 5000 [IU] via SUBCUTANEOUS
  Filled 2015-12-06 (×5): qty 1

## 2015-12-06 MED ORDER — MIDAZOLAM HCL 2 MG/2ML IJ SOLN
INTRAMUSCULAR | Status: DC | PRN
  Administered 2015-12-06: 2 mg via INTRAVENOUS

## 2015-12-06 MED ORDER — DEXAMETHASONE SODIUM PHOSPHATE 4 MG/ML IJ SOLN
INTRAMUSCULAR | Status: DC | PRN
  Administered 2015-12-06: 4 mg via INTRAVENOUS

## 2015-12-06 MED ORDER — HEPARIN SODIUM (PORCINE) 5000 UNIT/ML IJ SOLN
INTRAMUSCULAR | Status: AC
  Filled 2015-12-06: qty 1

## 2015-12-06 MED ORDER — ONDANSETRON HCL 4 MG/2ML IJ SOLN: 4.0000 mg | Freq: Once | INTRAMUSCULAR | Status: DC | PRN

## 2015-12-06 MED ORDER — PIPERACILLIN-TAZOBACTAM 3.375 G IVPB
3.3750 g | Freq: Once | INTRAVENOUS | Status: AC
  Administered 2015-12-06: 3.375 g via INTRAVENOUS
  Filled 2015-12-06: qty 50

## 2015-12-06 MED ORDER — ACETAMINOPHEN 10 MG/ML IV SOLN
INTRAVENOUS | Status: DC | PRN
  Administered 2015-12-06: 1000 mg via INTRAVENOUS
  Administered 2015-12-06: 100 mg via INTRAVENOUS

## 2015-12-06 MED ORDER — LIDOCAINE HCL (CARDIAC) 20 MG/ML IV SOLN
INTRAVENOUS | Status: DC | PRN
  Administered 2015-12-06: 100 mg via INTRAVENOUS

## 2015-12-06 MED ORDER — ONDANSETRON HCL 4 MG/2ML IJ SOLN
4.0000 mg | Freq: Four times a day (QID) | INTRAMUSCULAR | Status: DC | PRN
  Administered 2015-12-07: 4 mg via INTRAVENOUS
  Filled 2015-12-06: qty 2

## 2015-12-06 MED ORDER — PROPOFOL 10 MG/ML IV BOLUS
INTRAVENOUS | Status: DC | PRN
  Administered 2015-12-06: 120 mg via INTRAVENOUS

## 2015-12-06 MED ORDER — HYDROCODONE-ACETAMINOPHEN 5-325 MG PO TABS: 1.0000 | ORAL_TABLET | Freq: Four times a day (QID) | ORAL | Status: DC | PRN

## 2015-12-06 MED ORDER — LEVETIRACETAM 750 MG PO TABS: 750.0000 mg | ORAL_TABLET | Freq: Two times a day (BID) | ORAL | Status: DC

## 2015-12-06 MED ORDER — LAMOTRIGINE 150 MG PO TABS: 300.0000 mg | ORAL_TABLET | Freq: Every day | ORAL | Status: DC

## 2015-12-06 MED ORDER — PANTOPRAZOLE SODIUM 40 MG IV SOLR
40.0000 mg | Freq: Every day | INTRAVENOUS | Status: DC
  Administered 2015-12-06: 40 mg via INTRAVENOUS

## 2015-12-06 MED ORDER — LACTATED RINGERS IV SOLN
INTRAVENOUS | Status: DC | PRN
  Administered 2015-12-06 (×2): via INTRAVENOUS

## 2015-12-06 MED ORDER — DIPHENHYDRAMINE HCL 50 MG/ML IJ SOLN: 12.5000 mg | Freq: Four times a day (QID) | INTRAMUSCULAR | Status: DC | PRN

## 2015-12-06 MED ORDER — BUPIVACAINE HCL (PF) 0.25 % IJ SOLN
INTRAMUSCULAR | Status: AC
  Filled 2015-12-06: qty 30

## 2015-12-06 MED ORDER — NEOSTIGMINE METHYLSULFATE 10 MG/10ML IV SOLN
INTRAVENOUS | Status: DC | PRN
  Administered 2015-12-06: 3 mg via INTRAVENOUS

## 2015-12-06 MED ORDER — ONDANSETRON 8 MG PO TBDP
4.0000 mg | ORAL_TABLET | Freq: Four times a day (QID) | ORAL | Status: DC | PRN
  Filled 2015-12-06: qty 1

## 2015-12-06 MED ORDER — ROCURONIUM BROMIDE 100 MG/10ML IV SOLN
INTRAVENOUS | Status: DC | PRN
  Administered 2015-12-06: 40 mg via INTRAVENOUS
  Administered 2015-12-06: 20 mg via INTRAVENOUS

## 2015-12-06 MED ORDER — GABAPENTIN 300 MG PO CAPS: 300.0000 mg | ORAL_CAPSULE | Freq: Every day | ORAL | Status: DC

## 2015-12-06 MED ORDER — DIPHENHYDRAMINE HCL 12.5 MG/5ML PO ELIX: 12.5000 mg | ORAL_SOLUTION | Freq: Four times a day (QID) | ORAL | Status: DC | PRN

## 2015-12-06 MED ORDER — ZONISAMIDE 100 MG PO CAPS: 400.0000 mg | ORAL_CAPSULE | Freq: Every day | ORAL | Status: DC

## 2015-12-06 MED ORDER — FENTANYL CITRATE (PF) 100 MCG/2ML IJ SOLN: 25.0000 ug | INTRAMUSCULAR | Status: DC | PRN

## 2015-12-06 MED ORDER — GLYCOPYRROLATE 0.2 MG/ML IJ SOLN
INTRAMUSCULAR | Status: DC | PRN
  Administered 2015-12-06: 0.4 mg via INTRAVENOUS

## 2015-12-06 MED ORDER — HYDROMORPHONE HCL 1 MG/ML IJ SOLN
INTRAMUSCULAR | Status: DC | PRN
  Administered 2015-12-06: 1 mg via INTRAVENOUS

## 2015-12-06 MED ORDER — PIPERACILLIN-TAZOBACTAM 3.375 G IVPB: 3.3750 g | Freq: Three times a day (TID) | INTRAVENOUS | Status: DC

## 2015-12-06 MED ORDER — DEXTROSE IN LACTATED RINGERS 5 % IV SOLN
INTRAVENOUS | Status: DC
  Administered 2015-12-06 – 2015-12-08 (×4): via INTRAVENOUS

## 2015-12-06 MED ORDER — BUPIVACAINE HCL (PF) 0.25 % IJ SOLN
INTRAMUSCULAR | Status: DC | PRN
  Administered 2015-12-06: 30 mL

## 2015-12-06 MED ORDER — FENTANYL CITRATE (PF) 100 MCG/2ML IJ SOLN
25.0000 ug | INTRAMUSCULAR | Status: DC | PRN
Start: 2015-12-06 — End: 2015-12-06

## 2015-12-06 MED ORDER — ACETAMINOPHEN 10 MG/ML IV SOLN
INTRAVENOUS | Status: AC
  Filled 2015-12-06: qty 100

## 2015-12-06 MED ORDER — PHENYLEPHRINE HCL 10 MG/ML IJ SOLN
INTRAMUSCULAR | Status: DC | PRN
  Administered 2015-12-06 (×2): 100 ug via INTRAVENOUS

## 2015-12-06 MED ORDER — PIPERACILLIN-TAZOBACTAM 3.375 G IVPB 30 MIN: 3.3750 g | Freq: Three times a day (TID) | INTRAVENOUS | Status: DC

## 2015-12-06 MED ORDER — FUROSEMIDE 20 MG PO TABS: 20.0000 mg | ORAL_TABLET | ORAL | Status: DC

## 2015-12-06 MED ORDER — FENTANYL CITRATE (PF) 100 MCG/2ML IJ SOLN
INTRAMUSCULAR | Status: DC | PRN
  Administered 2015-12-06 (×2): 50 ug via INTRAVENOUS
  Administered 2015-12-06: 250 ug via INTRAVENOUS

## 2015-12-06 MED ORDER — PANTOPRAZOLE SODIUM 40 MG IV SOLR
INTRAVENOUS | Status: AC
  Filled 2015-12-06: qty 40

## 2015-12-06 SURGICAL SUPPLY — 51 items
APPLIER CLIP ROT 10 11.4 M/L (STAPLE) ×3
APR CLP MED LRG 11.4X10 (STAPLE) ×1
BAG COUNTER SPONGE EZ (MISCELLANEOUS) ×2 IMPLANT
BAG SPNG 4X4 CLR HAZ (MISCELLANEOUS) ×1
BULB RESERV EVAC DRAIN JP 100C (MISCELLANEOUS) ×2 IMPLANT
CANISTER SUCT 1200ML W/VALVE (MISCELLANEOUS) ×3 IMPLANT
CATH REDDICK CHOLANGI 4FR 50CM (CATHETERS) ×3 IMPLANT
CHLORAPREP W/TINT 26ML (MISCELLANEOUS) ×3 IMPLANT
CHOLANGIOGRAM CATH TAUT (CATHETERS) ×2 IMPLANT
CLIP APPLIE ROT 10 11.4 M/L (STAPLE) ×1 IMPLANT
CONRAY 60ML FOR OR (MISCELLANEOUS) ×3 IMPLANT
COUNTER SPONGE BAG EZ (MISCELLANEOUS) ×1
DRAIN CHANNEL JP 19F (MISCELLANEOUS) ×2 IMPLANT
DRAPE SHEET LG 3/4 BI-LAMINATE (DRAPES) ×3 IMPLANT
DRSG TEGADERM 2-3/8X2-3/4 SM (GAUZE/BANDAGES/DRESSINGS) ×12 IMPLANT
DRSG TELFA 3X8 NADH (GAUZE/BANDAGES/DRESSINGS) ×3 IMPLANT
ELECT REM PT RETURN 9FT ADLT (ELECTROSURGICAL) ×3
ELECTRODE REM PT RTRN 9FT ADLT (ELECTROSURGICAL) ×1 IMPLANT
GLOVE BIO SURGEON STRL SZ7.5 (GLOVE) ×3 IMPLANT
GLOVE INDICATOR 8.0 STRL GRN (GLOVE) ×3 IMPLANT
GOWN STRL REUS W/ TWL LRG LVL3 (GOWN DISPOSABLE) ×2 IMPLANT
GOWN STRL REUS W/TWL LRG LVL3 (GOWN DISPOSABLE) ×6
GRASPER SUT TROCAR 14GX15 (MISCELLANEOUS) ×3 IMPLANT
IRRIGATION STRYKERFLOW (MISCELLANEOUS) ×1 IMPLANT
IRRIGATOR STRYKERFLOW (MISCELLANEOUS) ×3
IV NS 1000ML (IV SOLUTION) ×3
IV NS 1000ML BAXH (IV SOLUTION) ×1 IMPLANT
LABEL OR SOLS (LABEL) ×3 IMPLANT
NDL HYPO 25X1 1.5 SAFETY (NEEDLE) ×1 IMPLANT
NDL INSUFFLATION 14GA 120MM (NEEDLE) ×1 IMPLANT
NDL SAFETY 18GX1.5 (NEEDLE) ×3 IMPLANT
NEEDLE HYPO 25X1 1.5 SAFETY (NEEDLE) ×3 IMPLANT
NEEDLE INSUFFLATION 14GA 120MM (NEEDLE) ×3 IMPLANT
NS IRRIG 500ML POUR BTL (IV SOLUTION) ×3 IMPLANT
PACK LAP CHOLECYSTECTOMY (MISCELLANEOUS) ×3 IMPLANT
PAD DRESSING TELFA 3X8 NADH (GAUZE/BANDAGES/DRESSINGS) ×1 IMPLANT
POUCH ENDO CATCH 10MM SPEC (MISCELLANEOUS) ×6 IMPLANT
SCISSORS METZENBAUM CVD 33 (INSTRUMENTS) ×3 IMPLANT
SEAL FOR SCOPE WARMER C3101 (MISCELLANEOUS) ×3 IMPLANT
SLEEVE ADV FIXATION 5X100MM (TROCAR) ×3 IMPLANT
SUT ETHILON 3-0 FS-10 30 BLK (SUTURE) ×3
SUT ETHILON 5-0 FS-2 18 BLK (SUTURE) ×3 IMPLANT
SUT VIC AB 0 CT1 36 (SUTURE) ×6 IMPLANT
SUT VIC AB 0 CT2 27 (SUTURE) ×3 IMPLANT
SUTURE EHLN 3-0 FS-10 30 BLK (SUTURE) IMPLANT
SYR 3ML LL SCALE MARK (SYRINGE) ×3 IMPLANT
TROCAR Z-THREAD FIOS 11X100 BL (TROCAR) ×3 IMPLANT
TROCAR Z-THREAD OPTICAL 5X100M (TROCAR) ×3 IMPLANT
TROCAR Z-THREAD SLEEVE 11X100 (TROCAR) ×3 IMPLANT
TUBING INSUFFLATOR HI FLOW (MISCELLANEOUS) ×3 IMPLANT
WATER STERILE IRR 1000ML POUR (IV SOLUTION) ×1 IMPLANT

## 2015-12-06 NOTE — Anesthesia Postprocedure Evaluation (Signed)
Anesthesia Post Note  Patient: Cindy Mcclure  Procedure(s) Performed: Procedure(s) (LRB): LAPAROSCOPIC CHOLECYSTECTOMY WITH INTRAOPERATIVE CHOLANGIOGRAM (N/A)  Patient location during evaluation: PACU Anesthesia Type: General Level of consciousness: awake Pain management: pain level controlled Vital Signs Assessment: post-procedure vital signs reviewed and stable Respiratory status: spontaneous breathing Cardiovascular status: blood pressure returned to baseline Anesthetic complications: no    Last Vitals:  Filed Vitals:   12/06/15 1127 12/06/15 1356  BP: 97/62 117/77  Pulse: 99 102  Temp: 37.6 C 36.5 C  Resp: 14 12    Last Pain:  Filed Vitals:   12/06/15 1359  PainSc: Asleep                 VAN STAVEREN,Khylei Wilms

## 2015-12-06 NOTE — H&P (Signed)
Patient ID: Cindy Mcclure, female   DOB: 1965-09-12, 50 y.o.   MRN: [id number]  History of Present Illness Cindy Mcclure is a 50 y.o. female with abdominal pain that started this morning.. Patient reports that the pain is in the epigastric area and the right upper quadrant, is severe and is constant. It is a sharp and the pain. There was no specific alleviating factors. Pain worsens when she moves. There's been some nausea and decreased appetite. No evidence of biliary obstruction. She does have a history of seizure disorder. She did have a history of 2 C-sections in the past. Denies any significant cardiovascular history. Further workup included an ultrasound revealed evidence of cholecystitis and cholelithiasis. LFTs are pending but white count of 24,000 with a left shift  Past Medical History Past Medical History  Diagnosis Date  . Seizures ([place])   . Arthritis   . Plantar fasciitis     bilaterally     History reviewed. No pertinent past surgical history.  No Known Allergies  Current Facility-Administered Medications  Medication Dose Route Frequency Provider Last Rate Last Dose  . diphenhydrAMINE (BENADRYL) 12.5 MG/5ML elixir 12.5 mg  12.5 mg Oral Q6H PRN Diego F Pabon, MD       Or  . diphenhydrAMINE (BENADRYL) injection 12.5 mg  12.5 mg Intravenous Q6H PRN Diego F Pabon, MD      . heparin injection 5,000 Units  5,000 Units Subcutaneous Q8H Diego F Pabon, MD      . morphine 4 MG/ML injection 4 mg  4 mg Intravenous Q2H PRN Diego F Pabon, MD      . ondansetron (ZOFRAN-ODT) disintegrating tablet 4 mg  4 mg Oral Q6H PRN Diego F Pabon, MD       Or  . ondansetron (ZOFRAN) injection 4 mg  4 mg Intravenous Q6H PRN Diego F Pabon, MD      . pantoprazole (PROTONIX) injection 40 mg  40 mg Intravenous QHS Diego F Pabon, MD      . piperacillin-tazobactam (ZOSYN) IVPB 3.375 g  3.375 g Intravenous Once Gregor Hams, MD 12.5 mL/hr at 12/05/15 2313 3.375 g at 12/05/15 2313  .  piperacillin-tazobactam (ZOSYN) IVPB 3.375 g  3.375 g Intravenous Q8H Diego Sarita Haver, MD       Current Outpatient Prescriptions  Medication Sig Dispense Refill  . acetaminophen-codeine (TYLENOL #3) 300-30 MG per tablet Take 1 tablet by mouth every 4 (four) hours as needed for moderate pain. 12 tablet 0  . cyclobenzaprine (FLEXERIL) 10 MG tablet Take 10 mg by mouth at bedtime.    Marland Kitchen etodolac (LODINE) 500 MG tablet Take 1 tablet (500 mg total) by mouth 2 (two) times daily. 30 tablet 0  . furosemide (LASIX) 20 MG tablet Take 20 mg by mouth once a week.    . gabapentin (NEURONTIN) 300 MG capsule Take 300 mg by mouth at bedtime.    . lamoTRIgine (LAMICTAL) 200 MG tablet Take 300 mg by mouth daily with supper.    . levETIRAcetam (KEPPRA) 750 MG tablet Take 750 mg by mouth 5 (five) times daily.    Marland Kitchen loratadine (CLARITIN) 10 MG tablet Take 10 mg by mouth daily.    Marland Kitchen zonisamide (ZONEGRAN) 100 MG capsule Take 400 mg by mouth daily with supper.      Family History No family history on file.    Social History Social History  Substance Use Topics  . Smoking status: Former Research scientist (life sciences)  . Smokeless tobacco: None  . Alcohol  Use: Yes     Comment: occasionally      ROS 10 pts review of system was performed and is otherwise negative  Physical Exam Blood pressure 116/62, pulse 117, temperature 98 F (36.7 C), temperature source Oral, resp. rate 16, height 5' (1.524 m), weight 81.194 kg (179 lb), SpO2 100 %.  CONSTITUTIONAL: She is laying still in discomfort EYES: Pupils equal, round, and reactive to light, Sclera non-icteric. EARS, NOSE, MOUTH AND THROAT: The oropharynx is clear. Oral mucosa is pink and moist. Hearing is intact to voice.  NECK: Trachea is midline, and there is no jugular venous distension. Thyroid is without palpable abnormalities. LYMPH NODES:  Lymph nodes in the neck are not enlarged. RESPIRATORY:  Lungs are clear, and breath sounds are equal bilaterally. Normal respiratory effort  without pathologic use of accessory muscles. CARDIOVASCULAR: Heart is regular without murmurs, gallops, or rubs. GI: The abdomen is soft, tender to palpation in the right upper quadrant and epigastric area. Positive Murphy sign  MUSCULOSKELETAL:  Normal muscle strength and tone in all four extremities.    SKIN: Skin turgor is normal. There are no pathologic skin lesions.  NEUROLOGIC:  Motor and sensation is grossly normal.  Cranial nerves are grossly intact. PSYCH:  Alert and oriented to person, place and time. Affect is normal.  Data Reviewed  I have personally reviewed the patient's imaging and medical records.    Assessment/Plan 50 year old female with classic signs and symptoms of acute cholecystitis confirmed by ultrasound. Plan will be to admit her, keep her nothing by mouth, hydrate her and provide IV narcotics. Schedule her for a laparoscopic cholecystectomy by Dr. Pat Patrick in later today. Discussed with the patient in detail about the planned operation ( laparoscopic cholecystectomy possible open).The risks, benefits, complications, treatment options, and expected outcomes were discussed with the patient. The possibilities of bleeding, recurrent infection, finding a normal gallbladder, perforation of viscus organs, damage to surrounding structures, bile leak, abscess formation, needing a drain placed, the need for additional procedures, reaction to medication, pulmonary aspiration,  failure to diagnose a condition, the possible need to convert to an open procedure, and creating a complication requiring transfusion or operation were discussed with the patient. The patient and/or family concurred with the proposed plan, giving informed consent. Depending on LFTs and final ultrasound read may decide to do a cholangiogram  Diego pabon, MD [place] 12/06/2015, 1:10 AM

## 2015-12-06 NOTE — Progress Notes (Signed)

## 2015-12-06 NOTE — Anesthesia Preprocedure Evaluation (Signed)
Anesthesia Evaluation  Patient identified by MRN, date of birth, ID band Patient awake    Airway Mallampati: II       Dental  (+) Teeth Intact   Pulmonary neg pulmonary ROS, former smoker,    Pulmonary exam normal        Cardiovascular negative cardio ROS   Rhythm:Regular     Neuro/Psych    GI/Hepatic negative GI ROS, Neg liver ROS,   Endo/Other  negative endocrine ROS  Renal/GU negative Renal ROS     Musculoskeletal   Abdominal Normal abdominal exam  (+)   Peds negative pediatric ROS (+)  Hematology negative hematology ROS (+)   Anesthesia Other Findings   Reproductive/Obstetrics                             Anesthesia Physical Anesthesia Plan  ASA: II  Anesthesia Plan: General   Post-op Pain Management:    Induction: Intravenous  Airway Management Planned: Oral ETT  Additional Equipment:   Intra-op Plan:   Post-operative Plan: Extubation in OR  Informed Consent: I have reviewed the patients History and Physical, chart, labs and discussed the procedure including the risks, benefits and alternatives for the proposed anesthesia with the patient or authorized representative who has indicated his/her understanding and acceptance.     Plan Discussed with: CRNA  Anesthesia Plan Comments:         Anesthesia Quick Evaluation

## 2015-12-06 NOTE — Op Note (Signed)
12/05/2015 - 12/06/2015  2:11 PM  PATIENT:  Cindy Mcclure A Ramp  50 y.o. female  PRE-OPERATIVE DIAGNOSIS:  Acute cholecystitis  POST-OPERATIVE DIAGNOSIS:  Acute cholecystitis  PROCEDURE:  Procedure(s): LAPAROSCOPIC CHOLECYSTECTOMY WITH INTRAOPERATIVE CHOLANGIOGRAM (N/A)  SURGEON:  Surgeon(s) and Role:    * Dia Crawford III, MD - Primary   ASSISTANTS: none   ANESTHESIA:   general  EBL:  Total I/O In: 1000 [I.V.:1000] Out: 400 [Drains:200; Blood:200]   DRAINS: (1) Jackson-Pratt drain(s) with closed bulb suction in the Subhepatic space   LOCAL MEDICATIONS USED:  BUPIVICAINE    DISPOSITION OF SPECIMEN:  PATHOLOGY   DICTATION: .Dragon Dictation  With the patient in the supine position and after the induction of appropriate general anesthesia the patient's abdomen was prepped ChloraPrep and draped sterile towels. The patient was placed headdown feet up position. A small infraumbilical incision was made in the standard fashion carried down bluntly through the subcutaneous tissue. A varies needle was used to cannulate peritoneal cavity. CO2 was insufflated to appropriate pressure measurements. When approximately 2-1/2 L of CO2 were instilled a varies needle was withdrawn and an 11 mm medical port inserted in the peritoneal cavity. Intraperitoneal position was confirmed and CO2 was reinsufflated.  The patient was placed in head up feet down position rotated slightly to the left side. Subxiphoid transverse incision was made 11 mm port inserted under direct vision. 2 lateral ports 5 mm in size were inserted under direct vision. The gallbladder was significantly distended erythematous and edematous. It was aspirated approximately 50 cc of clear bile. The gallbladder was markedly enlarged. It was elevated superiorly and laterally exposing the hepatoduodenal ligament. There was significant inflammatory change was difficult to identify the structures in the paraduodenal ligament. Cystic duct was  visualized doubly clipped and divided. It was foreshortened with the infection Was made to consider cholangiography. Cystic artery was visualized doubly clipped and divided. The gallbladder was then dissected free from its bed in the liver using hook and cautery apparatus. Dissection was quite difficult.  Once the gallbladder was free was captured Endo Catch apparatus removed through the subxiphoid incision. Incision had to be enlarged. During that maneuver a significant abdominal artery was transected. Large amount of blood was lost a short period of time. Proximally 200-250 cc. Gallbladder is removed without difficulty and the bleeding controlled using figure-of-eight sutures of 0 Vicryl suture passer apparatus. The abdomen was then copiously irrigated with 3 L of warm saline solution.  A 19 [place] Blake drain was inserted through a separate port and brought out through one of the lateral ports placing a drain in the fatty liver. The drain was secured with 3-0 nylon. The abdomen was again irrigated and flushed no other bleeding was encountered. The abdomen was desufflated. The midline fascia was closed with figure-of-eight sutures 0 Vicryl and skin was closed with 3 and 5-0 nylon. Sterile dressings were applied. Patient returned recovery room having tolerated procedure well. Sponge instrument and needle count were correct 2 in the operating room.   PLAN OF CARE: Admit for overnight observation  PATIENT DISPOSITION:  PACU - hemodynamically stable.   Dia Crawford III, MD

## 2015-12-06 NOTE — Transfer of Care (Signed)
Immediate Anesthesia Transfer of Care Note  Patient: Cindy Mcclure  Procedure(s) Performed: Procedure(s): LAPAROSCOPIC CHOLECYSTECTOMY WITH INTRAOPERATIVE CHOLANGIOGRAM (N/A)  Patient Location: PACU  Anesthesia Type:General  Level of Consciousness: sedated  Airway & Oxygen Therapy: Patient Spontanous Breathing and Patient connected to nasal cannula oxygen  Post-op Assessment: Report given to RN and Post -op Vital signs reviewed and stable  Post vital signs: Reviewed and stable  Last Vitals:  Filed Vitals:   12/06/15 0804 12/06/15 1127  BP: 103/55 97/62  Pulse: 97 99  Temp: 36.7 C 37.6 C  Resp: 19 14    Complications: No apparent anesthesia complications

## 2015-12-06 NOTE — ED Notes (Signed)
Pt transferred to room 209A

## 2015-12-06 NOTE — Anesthesia Procedure Notes (Signed)
Procedure Name: Intubation Date/Time: 12/06/2015 12:05 PM Performed by: Rosaria Ferries, Bertis Hustead Pre-anesthesia Checklist: Patient identified, Emergency Drugs available, Suction available and Patient being monitored Patient Re-evaluated:Patient Re-evaluated prior to inductionOxygen Delivery Method: Circle system utilized Preoxygenation: Pre-oxygenation with 100% oxygen Intubation Type: IV induction Laryngoscope Size: Mac and 3 Grade View: Grade I Tube type: Oral Tube size: 7.0 mm Number of attempts: 1 Placement Confirmation: ETT inserted through vocal cords under direct vision,  positive ETCO2 and breath sounds checked- equal and bilateral Secured at: 21 cm Tube secured with: Tape Dental Injury: Teeth and Oropharynx as per pre-operative assessment

## 2015-12-07 LAB — CBC
HEMATOCRIT: 31.9 % — AB (ref 35.0–47.0)
HEMOGLOBIN: 10.7 g/dL — AB (ref 12.0–16.0)
MCH: 29.7 pg (ref 26.0–34.0)
MCHC: 33.5 g/dL (ref 32.0–36.0)
MCV: 88.7 fL (ref 80.0–100.0)
Platelets: 310 10*3/uL (ref 150–440)
RBC: 3.6 MIL/uL — AB (ref 3.80–5.20)
RDW: 13.9 % (ref 11.5–14.5)
WBC: 16.2 10*3/uL — AB (ref 3.6–11.0)

## 2015-12-07 LAB — BASIC METABOLIC PANEL
ANION GAP: 5 (ref 5–15)
BUN: 13 mg/dL (ref 6–20)
CHLORIDE: 110 mmol/L (ref 101–111)
CO2: 24 mmol/L (ref 22–32)
CREATININE: 0.73 mg/dL (ref 0.44–1.00)
Calcium: 8.7 mg/dL — ABNORMAL LOW (ref 8.9–10.3)
GFR calc non Af Amer: >60 mL/min (ref >60)
Glucose, Bld: 125 mg/dL — ABNORMAL HIGH (ref 65–99)
Potassium: 4.2 mmol/L (ref 3.5–5.1)
SODIUM: 139 mmol/L (ref 135–145)

## 2015-12-07 LAB — SURGICAL PATHOLOGY

## 2015-12-07 MED ORDER — LAMOTRIGINE 100 MG PO TABS
300.0000 mg | ORAL_TABLET | Freq: Every day | ORAL | Status: DC
  Administered 2015-12-07: 300 mg via ORAL
  Filled 2015-12-07: qty 3

## 2015-12-07 MED ORDER — AMOXICILLIN-POT CLAVULANATE 875-125 MG PO TABS
1.0000 | ORAL_TABLET | Freq: Two times a day (BID) | ORAL | Status: DC
  Administered 2015-12-07 – 2015-12-08 (×3): 1 via ORAL
  Filled 2015-12-07 (×3): qty 1

## 2015-12-07 MED ORDER — LEVETIRACETAM 750 MG PO TABS
750.0000 mg | ORAL_TABLET | Freq: Two times a day (BID) | ORAL | Status: DC
  Administered 2015-12-07 – 2015-12-08 (×3): 750 mg via ORAL
  Filled 2015-12-07 (×4): qty 1

## 2015-12-07 MED ORDER — ZONISAMIDE 100 MG PO CAPS
400.0000 mg | ORAL_CAPSULE | Freq: Every day | ORAL | Status: DC
  Administered 2015-12-07: 400 mg via ORAL
  Filled 2015-12-07 (×2): qty 4

## 2015-12-07 MED ORDER — OXYCODONE-ACETAMINOPHEN 5-325 MG PO TABS
1.0000 | ORAL_TABLET | ORAL | Status: DC | PRN
  Administered 2015-12-07: 2 via ORAL
  Administered 2015-12-07 – 2015-12-08 (×2): 1 via ORAL
  Filled 2015-12-07 (×2): qty 2
  Filled 2015-12-07: qty 1

## 2015-12-07 NOTE — Progress Notes (Signed)
1 Day Post-Op   Subjective:  50 year old female one day status post laparoscopic cholecystectomy for acute, gangrenous cholecystitis. Patient reports continued having pain however it is different than the pain that brought her into the hospital. She has been tolerating the diet she has had but not been taking much by mouth. She denies any nausea or vomiting.  Vital signs in last 24 hours: Temp:  [97.6 F (36.4 C)-98 F (36.7 C)] 98 F (36.7 C) (04/21 1004) Pulse Rate:  [86-105] 88 (04/21 1004) Resp:  [10-18] 18 (04/21 1004) BP: (98-121)/(61-77) 102/70 mmHg (04/21 1004) SpO2:  [90 %-99 %] 93 % (04/21 1004) Last BM Date: 12/06/15  Intake/Output from previous day: 04/20 0701 - 04/21 0700 In: 3819 [P.O.:240; I.V.:3579] Out: 740 [Urine:100; Drains:440; Blood:200]  Physical exam: Gen.: No acute distress Chest: Clear to auscultation Heart: Regular rate and rhythm GI: Abdomen soft, nondistended, appropriately tender to palpation at incision sites. JP in place to the right upper quadrant draining a serosanguineous fluid. No evidence of peritoneal signs or guarding.  Lab Results:  CBC  Recent Labs  12/05/15 2040 12/07/15 0429  WBC 24.8* 16.2*  HGB 12.6 10.7*  HCT 38.2 31.9*  PLT 314 310   CMP     Component Value Date/Time   NA 139 12/07/2015 0429   NA 140 11/25/2014 1352   K 4.2 12/07/2015 0429   K 3.9 11/25/2014 1352   CL 110 12/07/2015 0429   CL 109 11/25/2014 1352   CO2 24 12/07/2015 0429   CO2 25 11/25/2014 1352   GLUCOSE 125* 12/07/2015 0429   GLUCOSE 113* 11/25/2014 1352   BUN 13 12/07/2015 0429   BUN 15 11/25/2014 1352   CREATININE 0.73 12/07/2015 0429   CREATININE 0.89 11/25/2014 1352   CALCIUM 8.7* 12/07/2015 0429   CALCIUM 9.0 11/25/2014 1352   PROT 7.1 12/06/2015 0500   ALBUMIN 3.6 12/06/2015 0500   AST 17 12/06/2015 0500   ALT 14 12/06/2015 0500   ALKPHOS 116 12/06/2015 0500   BILITOT 0.6 12/06/2015 0500   GFRNONAA >60 12/07/2015 0429   GFRNONAA >60  11/25/2014 1352   GFRAA >60 12/07/2015 0429   GFRAA >60 11/25/2014 1352   PT/INR No results for input(s): LABPROT, INR in the last 72 hours.  Studies/Results: Dg Chest 2 View  12/05/2015  CLINICAL DATA:  Upper mid chest pain with tenderness in both breasts since Tuesday. No injury. EXAM: CHEST  2 VIEW COMPARISON:  None. FINDINGS: Linear atelectasis in the lung bases. Normal heart size and pulmonary vascularity. No focal airspace disease or consolidation in the lungs. No blunting of costophrenic angles. No pneumothorax. Mediastinal contours appear intact. IMPRESSION: Mild linear atelectasis in the lung bases. Electronically Signed   By: Lucienne Capers M.D.   On: 12/05/2015 21:11   US Abdomen Limited Ruq  12/06/2015  CLINICAL DATA:  Right upper quadrant abdominal pain beginning yesterday morning. EXAM: US ABDOMEN LIMITED - RIGHT UPPER QUADRANT COMPARISON:  None. FINDINGS: Gallbladder: There multiple gallstones that lie dependently within the gallbladder. Gallbladder is moderately distended. Wall is borderline thickened measuring 3.4 mm. Mild wall edema is suggested. Common bile duct: Diameter: 4.7 mm Liver: No focal lesion identified. Within normal limits in parenchymal echogenicity. IMPRESSION: 1. Multiple gallstones and borderline thickened gallbladder wall. Findings support early acute cholecystitis in the proper clinical setting. Electronically Signed   By: Lajean Manes M.D.   On: 12/06/2015 11:11    Assessment/Plan: 50 year old female status post laparoscopic cholecystectomy for acute cholecystitis. Plan to transition  to oral antibiotics today as well as start oral pain medications. Encourage ambulation, incentive spirometry usage, oral intake. Possible discharge home tomorrow if able transition all medications to oral.   Clayburn Pert, MD FACS General Surgeon  12/07/2015

## 2015-12-08 LAB — BASIC METABOLIC PANEL
ANION GAP: 5 (ref 5–15)
BUN: 12 mg/dL (ref 6–20)
CHLORIDE: 104 mmol/L (ref 101–111)
CO2: 29 mmol/L (ref 22–32)
Calcium: 8.4 mg/dL — ABNORMAL LOW (ref 8.9–10.3)
Creatinine, Ser: 0.77 mg/dL (ref 0.44–1.00)
GFR calc Af Amer: >60 mL/min (ref >60)
Glucose, Bld: 108 mg/dL — ABNORMAL HIGH (ref 65–99)
POTASSIUM: 4.2 mmol/L (ref 3.5–5.1)
SODIUM: 138 mmol/L (ref 135–145)

## 2015-12-08 LAB — CBC
HCT: 31 % — ABNORMAL LOW (ref 35.0–47.0)
HEMOGLOBIN: 10.4 g/dL — AB (ref 12.0–16.0)
MCH: 29.4 pg (ref 26.0–34.0)
MCHC: 33.5 g/dL (ref 32.0–36.0)
MCV: 87.7 fL (ref 80.0–100.0)
PLATELETS: 309 10*3/uL (ref 150–440)
RBC: 3.54 MIL/uL — AB (ref 3.80–5.20)
RDW: 13.8 % (ref 11.5–14.5)
WBC: 10.7 10*3/uL (ref 3.6–11.0)

## 2015-12-08 MED ORDER — OXYCODONE-ACETAMINOPHEN 5-325 MG PO TABS: 1.0000 | ORAL_TABLET | ORAL | Status: DC | PRN

## 2015-12-08 MED ORDER — AMOXICILLIN-POT CLAVULANATE 875-125 MG PO TABS: 1.0000 | ORAL_TABLET | Freq: Two times a day (BID) | ORAL | Status: DC

## 2015-12-08 MED ORDER — ACETAMINOPHEN 325 MG PO TABS
650.0000 mg | ORAL_TABLET | Freq: Four times a day (QID) | ORAL | Status: DC | PRN
  Administered 2015-12-08: 650 mg via ORAL
  Filled 2015-12-08: qty 2

## 2015-12-08 NOTE — Final Progress Note (Signed)
2 Days Post-Op   Subjective:  Patient reports feeling better this morning. Has been tolerating a diet. Pain has been better controlled.  Vital signs in last 24 hours: Temp:  [97.5 F (36.4 C)-98.1 F (36.7 C)] 98.1 F (36.7 C) (04/22 0601) Pulse Rate:  [88-108] 108 (04/22 0601) Resp:  [16-18] 16 (04/22 0601) BP: (97-104)/(63-70) 104/65 mmHg (04/22 0601) SpO2:  [92 %-93 %] 92 % (04/22 0601) Last BM Date: 12/07/15  Intake/Output from previous day: 04/21 0701 - 04/22 0700 In: 2251 [I.V.:2251] Out: 400 [Urine:350; Drains:50]  GI: Abdomen is soft, probably tender to palpation at incision sites, nondistended. JP drain in place draining serous and was fluid. Dressings are clean, dry, intact to all surgical sites without evidence of spreading erythema or purulence.  Lab Results:  CBC  Recent Labs  12/07/15 0429 12/08/15 0506  WBC 16.2* 10.7  HGB 10.7* 10.4*  HCT 31.9* 31.0*  PLT 310 309   CMP     Component Value Date/Time   NA 138 12/08/2015 0506   NA 140 11/25/2014 1352   K 4.2 12/08/2015 0506   K 3.9 11/25/2014 1352   CL 104 12/08/2015 0506   CL 109 11/25/2014 1352   CO2 29 12/08/2015 0506   CO2 25 11/25/2014 1352   GLUCOSE 108* 12/08/2015 0506   GLUCOSE 113* 11/25/2014 1352   BUN 12 12/08/2015 0506   BUN 15 11/25/2014 1352   CREATININE 0.77 12/08/2015 0506   CREATININE 0.89 11/25/2014 1352   CALCIUM 8.4* 12/08/2015 0506   CALCIUM 9.0 11/25/2014 1352   PROT 7.1 12/06/2015 0500   ALBUMIN 3.6 12/06/2015 0500   AST 17 12/06/2015 0500   ALT 14 12/06/2015 0500   ALKPHOS 116 12/06/2015 0500   BILITOT 0.6 12/06/2015 0500   GFRNONAA >60 12/08/2015 0506   GFRNONAA >60 11/25/2014 1352   GFRAA >60 12/08/2015 0506   GFRAA >60 11/25/2014 1352   PT/INR No results for input(s): LABPROT, INR in the last 72 hours.  Studies/Results: No results found.  Assessment/Plan: 50 year old female status post laparoscopic cholecystectomy for gangrenous cholecystitis. Much  improved. Plan for discharge home today on oral antibiotics with a JP drain in place. She'll need JP drain instructions. She'll need follow-up in clinic on Monday or Tuesday of this coming with Dr. Dahlia Byes for wound check and possible drain removal.   Clayburn Pert, MD [facility] General Surgeon  12/08/2015

## 2015-12-08 NOTE — Discharge Summary (Signed)
Patient ID: Cindy Mcclure MRN: [id number] DOB/AGE: 10/06/1965 50 y.o.  Admit date: 12/05/2015 Discharge date: 12/08/2015  Discharge Diagnoses:  Gangrenous cholecystitis  Procedures Performed: Laparoscopic cholecystectomy  Discharged Condition: good  Hospital Course: Patient taken the operating room for acute cholecystitis and found to have gangrenous cholecystitis on day of admission. Patient tolerated surgery well as well as drain placement. Treated with IV and then oral antibiotics. Tolerating a regular diet and all oral medications prior to discharge.  Discharge Orders:  discharge home  Disposition: 01-Home or Self Care  Discharge Medications:   Medication List    TAKE these medications        acetaminophen-codeine 300-30 MG tablet  Commonly known as:  TYLENOL #3  Take 1 tablet by mouth every 4 (four) hours as needed for moderate pain.     amoxicillin-clavulanate 875-125 MG tablet  Commonly known as:  AUGMENTIN  Take 1 tablet by mouth every 12 (twelve) hours.     cyclobenzaprine 10 MG tablet  Commonly known as:  FLEXERIL  Take 10 mg by mouth at bedtime.     etodolac 500 MG tablet  Commonly known as:  LODINE  Take 1 tablet (500 mg total) by mouth 2 (two) times daily.     furosemide 20 MG tablet  Commonly known as:  LASIX  Take 20 mg by mouth once a week.     gabapentin 300 MG capsule  Commonly known as:  NEURONTIN  Take 300 mg by mouth at bedtime.     lamoTRIgine 200 MG tablet  Commonly known as:  LAMICTAL  Take 300 mg by mouth daily with supper.     levETIRAcetam 750 MG tablet  Commonly known as:  KEPPRA  Take 750 mg by mouth 2 (two) times daily.     loratadine 10 MG tablet  Commonly known as:  CLARITIN  Take 10 mg by mouth daily.     oxyCODONE-acetaminophen 5-325 MG tablet  Commonly known as:  PERCOCET/ROXICET  Take 1-2 tablets by mouth every 4 (four) hours as needed for moderate pain or severe pain.     zonisamide 100 MG capsule  Commonly  known as:  ZONEGRAN  Take 400 mg by mouth daily with supper.         Follwup: Follow-up Information    Follow up with [facility]. Schedule an appointment as soon as possible for a visit in 3 days.   Specialty:  General Surgery   Why:  For wound re-check and possible drain removal   Contact information:   [address], [place]      Signed: Clayburn Pert 12/08/2015, 9:20 AM

## 2015-12-08 NOTE — Progress Notes (Signed)
Patient discharged per MD orders. Reviewed orders with patient. Instructed patient on care of dressings and care of JP drain. Provided sheet to record measurements and instructed how to document on sheet.  Patient verbalized understanding.

## 2015-12-08 NOTE — Discharge Instructions (Signed)
Bulb Drain Home Care A bulb drain consists of a thin rubber tube and a soft, round bulb that creates a gentle suction. The rubber tube is placed in the area where you had surgery. A bulb is attached to the end of the tube that is outside the body. The bulb drain removes excess fluid that normally builds up in a surgical wound after surgery. The color and amount of fluid will vary. Immediately after surgery, the fluid is bright red and is a little thicker than water. It may gradually change to a yellow or pink color and become more thin and water-like. When the amount decreases to about 1 or 2 tbsp in 24 hours, your health care provider will usually remove it. DAILY CARE  Keep the bulb flat (compressed) at all times, except while emptying it. The flatness creates suction. You can flatten the bulb by squeezing it firmly in the middle and then closing the cap.  Keep sites where the tube enters the skin dry and covered with a bandage (dressing).  Secure the tube 1-2 in (2.5-5.1 cm) below the insertion sites to keep it from pulling on your stitches. The tube is stitched in place and will not slip out.  Secure the bulb as directed by your health care provider.  For the first 3 days after surgery, there usually is more fluid in the bulb. Empty the bulb whenever it becomes half full because the bulb does not create enough suction if it is too full. The bulb could also overflow. Write down how much fluid you remove each time you empty your drain. Add up the amount removed in 24 hours.  Empty the bulb at the same time every day once the amount of fluid decreases and you only need to empty it once a day. Write down the amounts and the 24-hour totals to give to your health care provider. This helps your health care provider know when the tubes can be removed. EMPTYING THE BULB DRAIN Before emptying the bulb, get a measuring cup, a piece of paper and a pen, and wash your hands.  Gently run your fingers down the  tube (stripping) to empty any drainage from the tubing into the bulb. This may need to be done several times a day to clear the tubing of clots and tissue.  Open the bulb cap to release suction, which causes it to inflate. Do not touch the inside of the cap.  Gently run your fingers down the tube (stripping) to empty any drainage from the tubing into the bulb.  Hold the cap out of the way, and pour fluid into the measuring cup.   Squeeze the bulb to provide suction.  Replace the cap.   Check the tape that holds the tube to your skin. If it is becoming loose, you can remove the loose piece of tape and apply a new one. Then, pin the bulb to your shirt.   Write down the amount of fluid you emptied out. Write down the date and each time you emptied your bulb drain. (If there are 2 bulbs, note the amount of drainage from each bulb and keep the totals separate. Your health care provider will want to know the total amounts for each drain and which tube is draining more.)   Flush the fluid down the toilet and wash your hands.   Call your health care provider once you have less than 2 tbsp of fluid collecting in the bulb drain every 24 hours. If  there is drainage around the tube site, change dressings and keep the area dry. Cleanse around tube with sterile saline and place dry gauze around site. This gauze should be changed when it is soiled. If it stays clean and unsoiled, it should still be changed daily.  SEEK MEDICAL CARE IF:  Your drainage has a bad smell or is cloudy.   You have a fever.   Your drainage is increasing instead of decreasing.   Your tube fell out.   You have redness or swelling around the tube site.   You have drainage from a surgical wound.   Your bulb drain will not stay flat after you empty it.  MAKE SURE YOU:   Understand these instructions.  Will watch your condition.  Will get help right away if you are not doing well or get worse.   This  information is not intended to replace advice given to you by your health care provider. Make sure you discuss any questions you have with your health care provider.   Document Released: 08/01/2000 Document Revised: 08/25/2014 Document Reviewed: 02/21/2015 Elsevier Interactive Patient Education 2016 [employer] Laparoscopic Cholecystectomy, Care After These instructions give you information about caring for yourself after your procedure. Your doctor may also give you more specific instructions. Call your doctor if you have any problems or questions after your procedure. HOME CARE Incision Care  Follow instructions from your doctor about how to take care of your cuts from surgery (incisions). Make sure you:  Wash your hands with soap and water before you change your bandage (dressing). If you cannot use soap and water, use hand sanitizer.  Change your bandage as told by your doctor.  Leave stitches (sutures), skin glue, or skin tape (adhesive) strips in place. They may need to stay in place for 2 weeks or longer. If tape strips get loose and curl up, you may trim the loose edges. Do not remove tape strips completely unless your doctor says it is okay.  Do not take baths, swim, or use a hot tub until your doctor says it is okay. Ask your doctor if you can take showers. You may only be allowed to take sponge baths. General Instructions  Take over-the-counter and prescription medicines only as told by your doctor.  Do not drive or use heavy machinery while taking prescription pain medicine.  Return to your normal diet as told by your doctor.  Do not lift anything that is heavier than 10 lb (4.5 kg).  Do not play contact sports for 1 week or until your doctor says it is okay. GET HELP IF:  You have redness, swelling, or pain at the site of your surgical cuts.  You have fluid, blood, or pus coming from your cuts.  You notice a bad smell coming from your cut area.  Your surgical cuts  break open.  You have a fever. GET HELP RIGHT AWAY IF:   You have a rash.  You have trouble breathing.  You have chest pain.  You have pain in your shoulders (shoulder strap areas) that is getting worse.  You pass out (faint) or feel dizzy while you are standing.  You have very bad pain in your belly (abdomen).  You feel sick to your stomach (nauseous) or throw up (vomit) for more than 1 day.   This information is not intended to replace advice given to you by your health care provider. Make sure you discuss any questions you have with your health care  provider.   Document Released: 05/13/2008 Document Revised: 04/25/2015 Document Reviewed: 03/16/2013 Elsevier Interactive Patient Education [employer].

## 2015-12-11 ENCOUNTER — Other Ambulatory Visit: Payer: Self-pay

## 2015-12-12 ENCOUNTER — Encounter: Payer: Self-pay | Admitting: Surgery

## 2015-12-12 ENCOUNTER — Ambulatory Visit (INDEPENDENT_AMBULATORY_CARE_PROVIDER_SITE_OTHER): Payer: Medicare Other | Admitting: Surgery

## 2015-12-12 ENCOUNTER — Other Ambulatory Visit: Payer: Self-pay

## 2015-12-12 VITALS — BP 115/73 | HR 84 | Temp 97.9°F | Ht 60.0 in | Wt 171.0 lb

## 2015-12-12 DIAGNOSIS — N3946 Mixed incontinence: Secondary | ICD-10-CM | POA: Insufficient documentation

## 2015-12-12 DIAGNOSIS — Z09 Encounter for follow-up examination after completed treatment for conditions other than malignant neoplasm: Secondary | ICD-10-CM

## 2015-12-12 NOTE — Patient Instructions (Signed)

## 2015-12-14 NOTE — Progress Notes (Signed)
Status post laparoscopic cholecystectomy gangrenous cholecystitis by Dr. Pat Patrick. Drain placement Doing much better. Minimal JP output. Tolerating by mouth and minimal pain.  PE NAD Abd: soft, NT, JP scant serous fluid. Incisions c/d/i. No peritonitis , no infection  A/P Doing well DC stitches and JP RTC prn No heavy lifting.

## 2016-05-05 DIAGNOSIS — K5900 Constipation, unspecified: Secondary | ICD-10-CM | POA: Insufficient documentation

## 2016-05-26 IMAGING — CR DG KNEE COMPLETE 4+V*L*
4 series · 4 of 4 positions shown · non-contrast
Comparison: MRI and left knee [DATE]

CLINICAL DATA: Slip and fall injury.  Pain to the ankle and knee.

EXAM:
LEFT KNEE - COMPLETE 4+ VIEW

[knee ap]
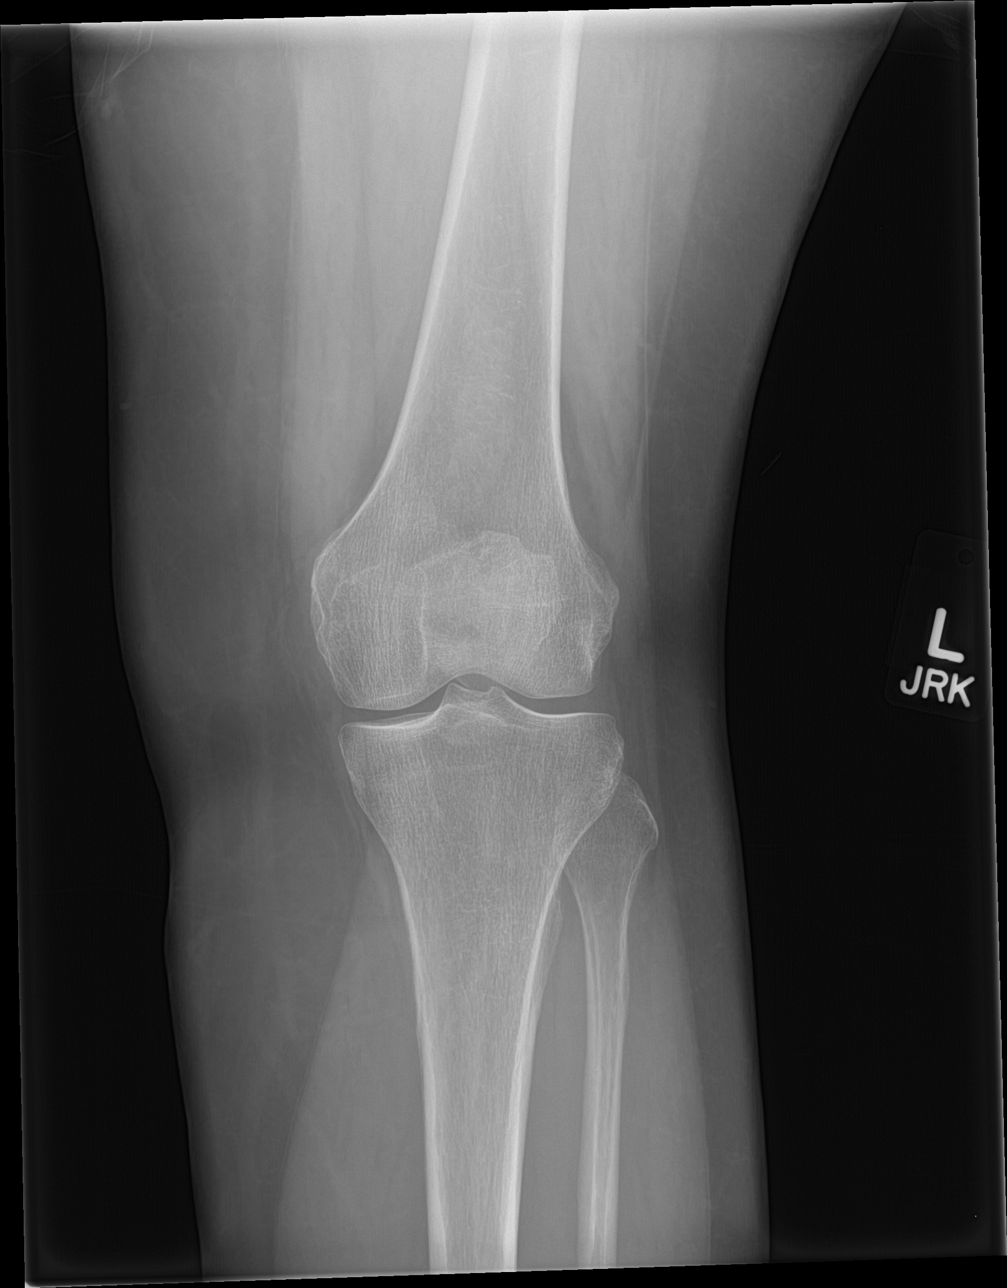

[knee obl (1 of 2)]
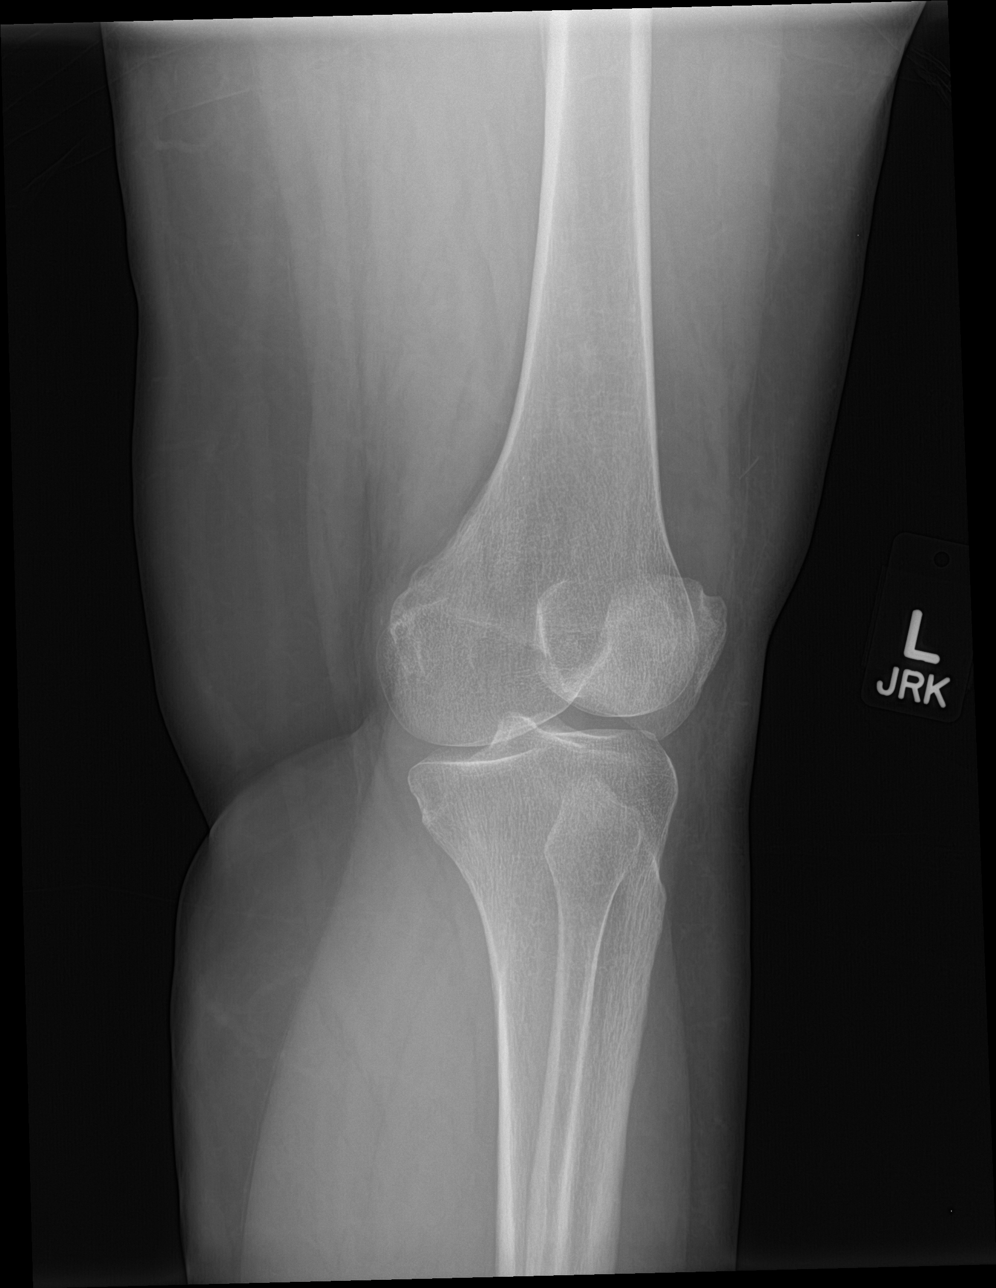

[knee obl (2 of 2)]
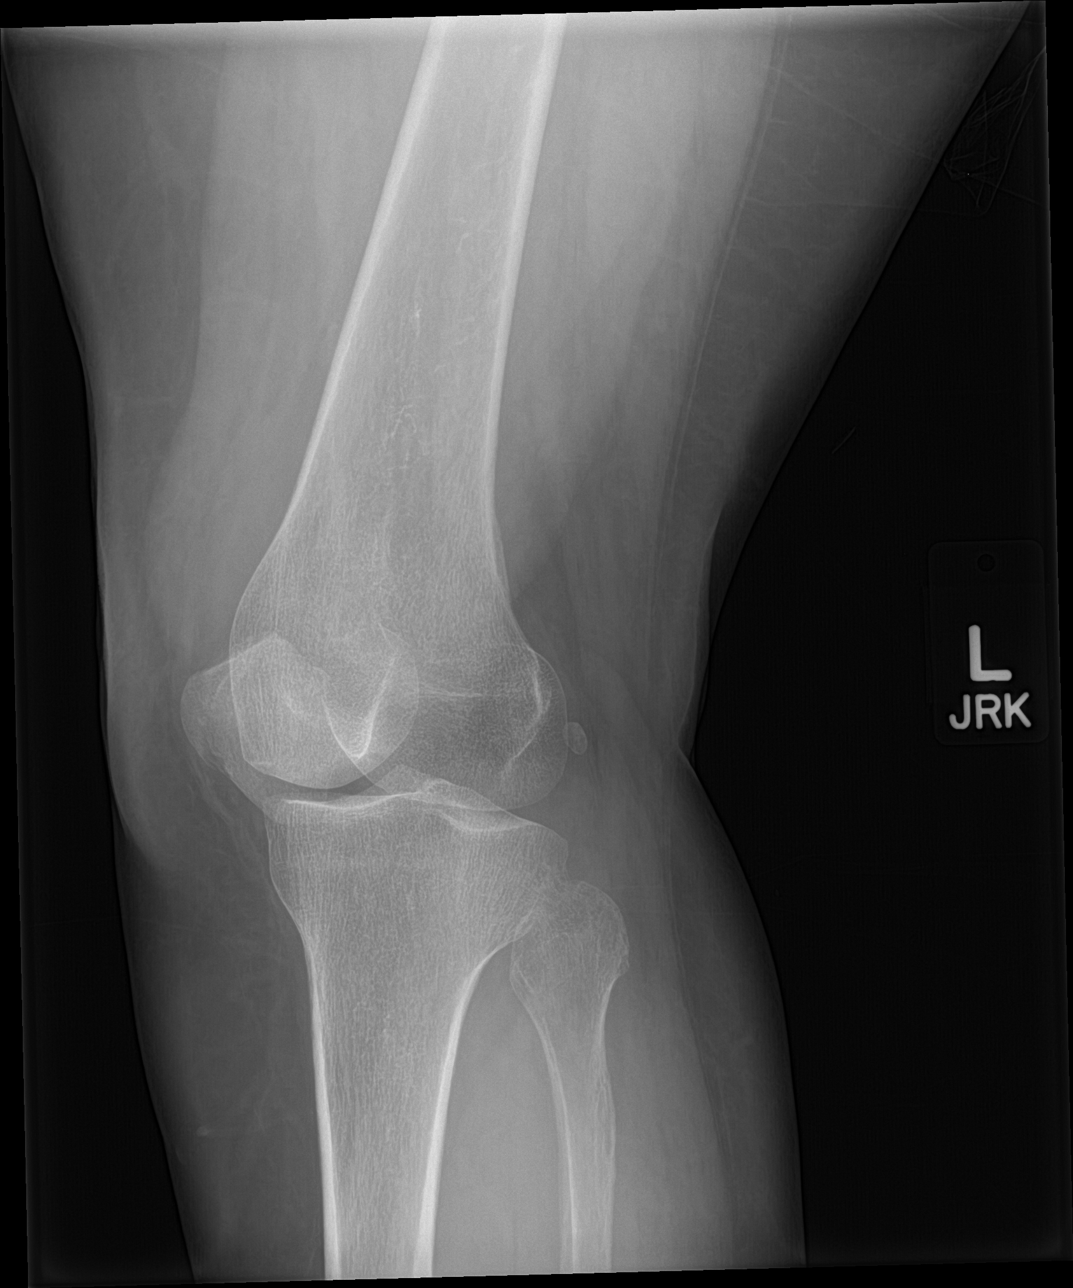

[knee lat]
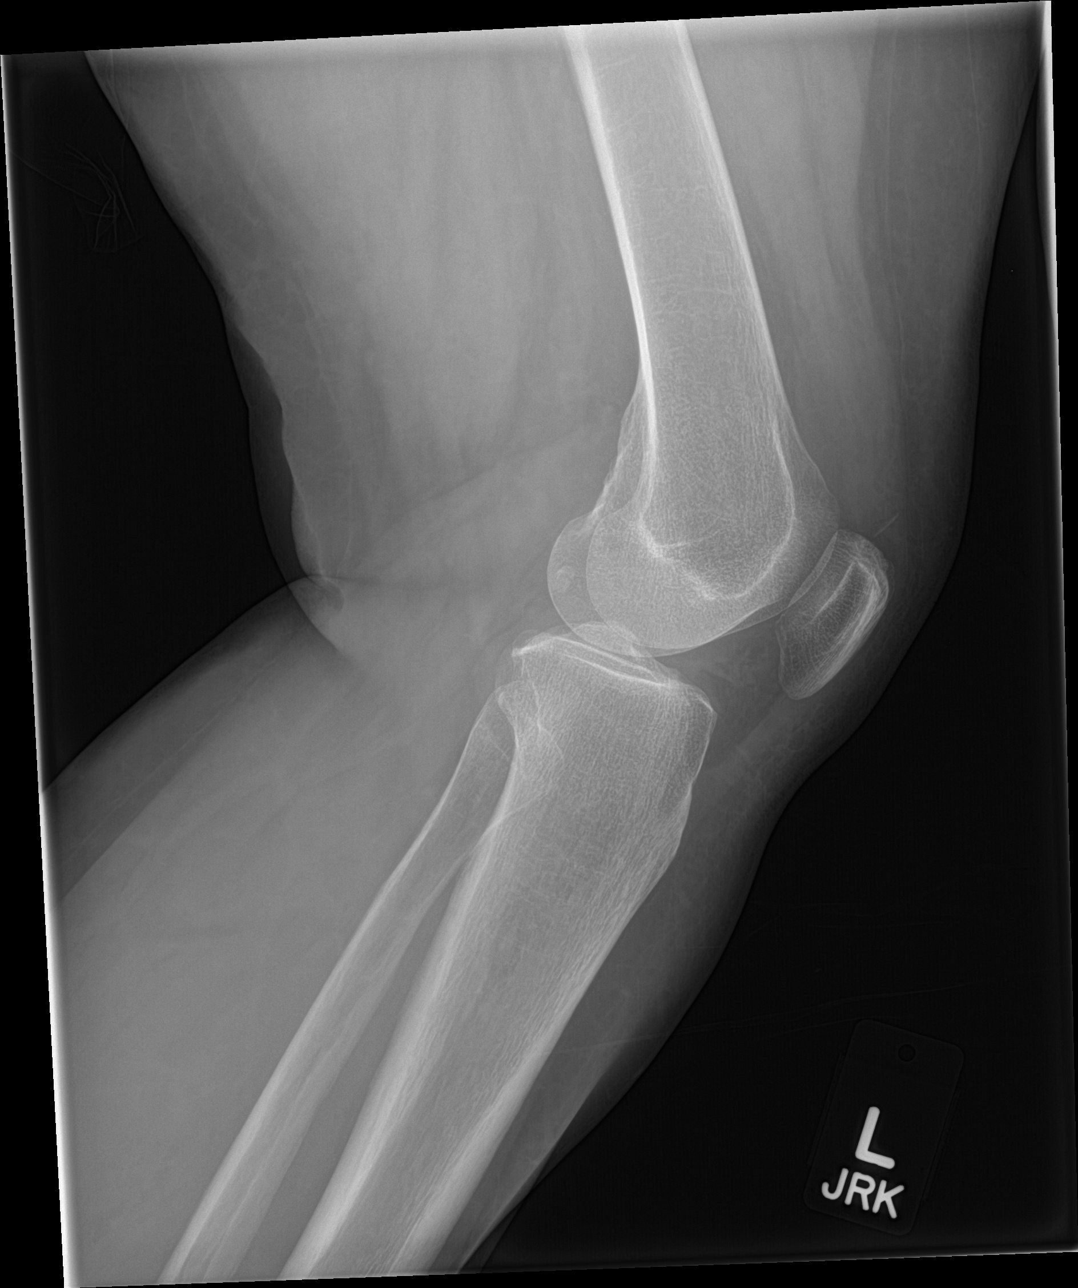

[4 of 4 positions shown; findings below may reference images not displayed]

FINDINGS: There is no evidence of fracture, dislocation, or joint effusion.
There is no evidence of arthropathy or other focal bone abnormality.
Soft tissues are unremarkable.
IMPRESSION: Negative.

## 2016-07-01 DIAGNOSIS — N3946 Mixed incontinence: Secondary | ICD-10-CM | POA: Insufficient documentation

## 2016-07-15 ENCOUNTER — Other Ambulatory Visit: Payer: Self-pay | Admitting: Physical Medicine and Rehabilitation

## 2016-07-15 DIAGNOSIS — M545 Low back pain: Secondary | ICD-10-CM

## 2016-07-24 ENCOUNTER — Ambulatory Visit
Admission: RE | Admit: 2016-07-24 | Discharge: 2016-07-24 | Disposition: A | Discharge status: Home / Self Care | Payer: Medicare Other | Source: Ambulatory Visit | Attending: Physical Medicine and Rehabilitation | Admitting: Physical Medicine and Rehabilitation

## 2016-07-24 DIAGNOSIS — M5126 Other intervertebral disc displacement, lumbar region: Secondary | ICD-10-CM | POA: Diagnosis not present

## 2016-07-24 DIAGNOSIS — M545 Low back pain: Secondary | ICD-10-CM

## 2016-07-24 DIAGNOSIS — M47896 Other spondylosis, lumbar region: Secondary | ICD-10-CM | POA: Insufficient documentation

## 2016-07-24 DIAGNOSIS — M48061 Spinal stenosis, lumbar region without neurogenic claudication: Secondary | ICD-10-CM | POA: Diagnosis not present

## 2016-07-29 DIAGNOSIS — R296 Repeated falls: Secondary | ICD-10-CM | POA: Insufficient documentation

## 2016-09-29 ENCOUNTER — Ambulatory Visit: Payer: Medicare Other | Admitting: Anesthesiology

## 2016-09-29 ENCOUNTER — Encounter
Admission: RE | Disposition: A | Discharge status: Home / Self Care | Payer: Self-pay | Source: Ambulatory Visit | Attending: Unknown Physician Specialty

## 2016-09-29 ENCOUNTER — Ambulatory Visit
Admission: RE | Admit: 2016-09-29 | Discharge: 2016-09-29 | Disposition: A | Discharge status: Home / Self Care | Payer: Medicare Other | Source: Ambulatory Visit | Attending: Unknown Physician Specialty | Admitting: Unknown Physician Specialty

## 2016-09-29 DIAGNOSIS — K6389 Other specified diseases of intestine: Secondary | ICD-10-CM | POA: Diagnosis not present

## 2016-09-29 DIAGNOSIS — Z87891 Personal history of nicotine dependence: Secondary | ICD-10-CM | POA: Diagnosis not present

## 2016-09-29 DIAGNOSIS — F419 Anxiety disorder, unspecified: Secondary | ICD-10-CM | POA: Insufficient documentation

## 2016-09-29 DIAGNOSIS — R569 Unspecified convulsions: Secondary | ICD-10-CM | POA: Insufficient documentation

## 2016-09-29 DIAGNOSIS — K5900 Constipation, unspecified: Secondary | ICD-10-CM | POA: Insufficient documentation

## 2016-09-29 DIAGNOSIS — Z79899 Other long term (current) drug therapy: Secondary | ICD-10-CM | POA: Insufficient documentation

## 2016-09-29 DIAGNOSIS — K640 First degree hemorrhoids: Secondary | ICD-10-CM | POA: Insufficient documentation

## 2016-09-29 HISTORY — DX: Other cervical disc degeneration, unspecified cervical region: M50.30

## 2016-09-29 HISTORY — DX: Unspecified urinary incontinence: R32

## 2016-09-29 HISTORY — DX: Anxiety disorder, unspecified: F41.9

## 2016-09-29 HISTORY — PX: COLONOSCOPY WITH PROPOFOL: SHX5780

## 2016-09-29 HISTORY — DX: Allergic rhinitis, unspecified: J30.9

## 2016-09-29 HISTORY — DX: Unspecified osteoarthritis, unspecified site: M19.90

## 2016-09-29 HISTORY — DX: Constipation, unspecified: K59.00

## 2016-09-29 HISTORY — DX: Radiculopathy, cervical region: M54.12

## 2016-09-29 LAB — URINE DRUG SCREEN, QUALITATIVE (ARMC ONLY)
AMPHETAMINES, UR SCREEN: NOT DETECTED
Barbiturates, Ur Screen: NOT DETECTED
Benzodiazepine, Ur Scrn: POSITIVE — AB
CANNABINOID 50 NG, UR ~~LOC~~: NOT DETECTED
COCAINE METABOLITE, UR ~~LOC~~: NOT DETECTED
MDMA (ECSTASY) UR SCREEN: NOT DETECTED
Methadone Scn, Ur: NOT DETECTED
Opiate, Ur Screen: NOT DETECTED
PHENCYCLIDINE (PCP) UR S: NOT DETECTED
Tricyclic, Ur Screen: NOT DETECTED

## 2016-09-29 SURGERY — COLONOSCOPY WITH PROPOFOL
Anesthesia: General

## 2016-09-29 MED ORDER — FENTANYL CITRATE (PF) 100 MCG/2ML IJ SOLN
INTRAMUSCULAR | Status: AC
  Filled 2016-09-29: qty 2

## 2016-09-29 MED ORDER — LIDOCAINE HCL (PF) 2 % IJ SOLN
INTRAMUSCULAR | Status: AC
  Filled 2016-09-29: qty 2

## 2016-09-29 MED ORDER — SODIUM CHLORIDE 0.9 % IV SOLN
INTRAVENOUS | Status: DC
  Administered 2016-09-29 (×2): via INTRAVENOUS

## 2016-09-29 MED ORDER — FENTANYL CITRATE (PF) 100 MCG/2ML IJ SOLN
INTRAMUSCULAR | Status: DC | PRN
  Administered 2016-09-29: 50 ug via INTRAVENOUS

## 2016-09-29 MED ORDER — MIDAZOLAM HCL 5 MG/5ML IJ SOLN
INTRAMUSCULAR | Status: DC | PRN
  Administered 2016-09-29: 1 mg via INTRAVENOUS

## 2016-09-29 MED ORDER — LIDOCAINE 2% (20 MG/ML) 5 ML SYRINGE
INTRAMUSCULAR | Status: DC | PRN
  Administered 2016-09-29: 40 mg via INTRAVENOUS

## 2016-09-29 MED ORDER — PROPOFOL 500 MG/50ML IV EMUL
INTRAVENOUS | Status: DC | PRN
  Administered 2016-09-29: 140 ug/kg/min via INTRAVENOUS

## 2016-09-29 MED ORDER — PROPOFOL 500 MG/50ML IV EMUL
INTRAVENOUS | Status: AC
  Filled 2016-09-29: qty 50

## 2016-09-29 MED ORDER — PHENYLEPHRINE HCL 10 MG/ML IJ SOLN
INTRAMUSCULAR | Status: DC | PRN
  Administered 2016-09-29: 100 ug via INTRAVENOUS

## 2016-09-29 MED ORDER — PROPOFOL 10 MG/ML IV BOLUS
INTRAVENOUS | Status: AC
  Filled 2016-09-29: qty 20

## 2016-09-29 MED ORDER — SODIUM CHLORIDE 0.9 % IV SOLN: INTRAVENOUS | Status: DC

## 2016-09-29 MED ORDER — PROPOFOL 10 MG/ML IV BOLUS
INTRAVENOUS | Status: DC | PRN
  Administered 2016-09-29: 100 mg via INTRAVENOUS

## 2016-09-29 MED ORDER — MIDAZOLAM HCL 2 MG/2ML IJ SOLN
INTRAMUSCULAR | Status: AC
  Filled 2016-09-29: qty 2

## 2016-09-29 NOTE — H&P (Signed)
Primary Care Physician:  Corinda Gubler, DO Primary Gastroenterologist:  Dr. Vira Agar  Pre-Procedure History & Physical: HPI:  Cindy Mcclure is a 51 y.o. female is here for an colonoscopy.   Past Medical History:  Diagnosis Date  . Allergic rhinitis   . Anxiety   . Arthritis    all over...knees, ankles, back  . Cervical radiculitis   . Constipation   . Constipation   . DDD (degenerative disc disease), cervical   . Osteoarthritis   . Plantar fasciitis    bilaterally  . Seizures ([place])    pt states that her last seizure was 6 months ago.states she is taking her medicine  . Urinary incontinence     Past Surgical History:  Procedure Laterality Date  . CESAREAN SECTION  [id number]  . CHOLECYSTECTOMY N/A 12/06/2015   Procedure: LAPAROSCOPIC CHOLECYSTECTOMY WITH INTRAOPERATIVE CHOLANGIOGRAM;  Surgeon: Dia Crawford III, MD;  Location: [facility] ORS;  Service: General;  Laterality: N/A;  . SURGERY FOR SEIZURES    . TUBAL LIGATION  2002    Prior to Admission medications   Medication Sig Start Date End Date Taking? Authorizing Provider  cloBAZam (ONFI) 10 MG tablet Take 20 mg by mouth daily.   Yes Historical Provider, MD  cyclobenzaprine (FLEXERIL) 10 MG tablet Take 10 mg by mouth at bedtime.   Yes Historical Provider, MD  diclofenac (VOLTAREN) 75 MG EC tablet Take 75 mg by mouth 2 (two) times daily.   Yes Historical Provider, MD  lamoTRIgine (LAMICTAL) 150 MG tablet Take 300 mg by mouth daily.   Yes Historical Provider, MD  levETIRAcetam (KEPPRA) 750 MG tablet Take 750 mg by mouth 2 (two) times daily.    Yes Historical Provider, MD  loratadine (CLARITIN) 10 MG tablet Take 10 mg by mouth daily.   Yes Historical Provider, MD  Multiple Vitamin (MULTIVITAMIN) tablet Take 1 tablet by mouth daily.   Yes Historical Provider, MD  sennosides-docusate sodium (SENOKOT-S) 8.6-50 MG tablet Take 1 tablet by mouth daily.   Yes Historical Provider, MD  vitamin C (ASCORBIC ACID) 500 MG tablet Take 500 mg  by mouth daily.   Yes Historical Provider, MD  zonisamide (ZONEGRAN) 100 MG capsule Take 400 mg by mouth daily with supper.   Yes Historical Provider, MD  acetaminophen-codeine (TYLENOL #3) 300-30 MG per tablet Take 1 tablet by mouth every 4 (four) hours as needed for moderate pain. Patient not taking: Reported on 09/26/2016 01/07/15   Victorino Dike, FNP  amoxicillin-clavulanate (AUGMENTIN) 875-125 MG tablet Take 1 tablet by mouth every 12 (twelve) hours. Patient not taking: Reported on 09/26/2016 12/08/15   Clayburn Pert, MD  etodolac (LODINE) 500 MG tablet Take 1 tablet (500 mg total) by mouth 2 (two) times daily. Patient not taking: Reported on 09/29/2016 01/07/15   Victorino Dike, FNP  furosemide (LASIX) 20 MG tablet Take 20 mg by mouth once a week.    Historical Provider, MD  gabapentin (NEURONTIN) 300 MG capsule Take 300 mg by mouth 3 (three) times daily.     Historical Provider, MD  Multiple Vitamins-Minerals (CENTRUM ADULTS PO) Take by mouth daily at 2 PM.    Historical Provider, MD  oxyCODONE-acetaminophen (PERCOCET/ROXICET) 5-325 MG tablet Take 1-2 tablets by mouth every 4 (four) hours as needed for moderate pain or severe pain. Patient not taking: Reported on 09/29/2016 12/08/15   Clayburn Pert, MD  traMADol (ULTRAM) 50 MG tablet Take 1 tablet by mouth every 6 (six) hours as needed.  10/12/15  Historical Provider, MD    Allergies as of 09/10/2016  . (No Known Allergies)    History reviewed. No pertinent family history.  Social History   Social History  . Marital status: Divorced    Spouse name: N/A  . Number of children: N/A  . Years of education: N/A   Occupational History  . Not on file.   Social History Main Topics  . Smoking status: Former Smoker    Packs/day: 0.25    Quit date: 08/17/2012  . Smokeless tobacco: Never Used     Comment: smokers in home  . Alcohol use 0.6 oz/week    1 Cans of beer per week     Comment: occasionally  . Drug use: No     Comment:  denies  . Sexual activity: No   Other Topics Concern  . Not on file   Social History Narrative  . No narrative on file    Review of Systems: See HPI, otherwise negative ROS  Physical Exam: BP 102/78   Pulse 90   Temp (!) 96.6 F (35.9 C) (Tympanic)   Resp 14   Ht 4\' 11"  (1.499 m)   Wt 70.3 kg (155 lb)   SpO2 100%   BMI 31.31 kg/m  General:   Alert,  pleasant and cooperative in NAD Head:  Normocephalic and atraumatic. Neck:  Supple; no masses or thyromegaly. Lungs:  Clear throughout to auscultation.    Heart:  Regular rate and rhythm. Abdomen:  Soft, nontender and nondistended. Normal bowel sounds, without guarding, and without rebound.   Neurologic:  Alert and  oriented x4;  grossly normal neurologically.  Impression/Plan: Cindy Mcclure is here for an colonoscopy to be performed for constipation  Risks, benefits, limitations, and alternatives regarding  colonoscopy have been reviewed with the patient.  Questions have been answered.  All parties agreeable.   Gaylyn Cheers, MD  09/29/2016, 8:34 AM

## 2016-09-29 NOTE — Anesthesia Preprocedure Evaluation (Signed)
Anesthesia Evaluation  Patient identified by MRN, date of birth, ID band Patient awake    Reviewed: Allergy & Precautions, NPO status , Patient's Chart, lab work & pertinent test results  History of Anesthesia Complications Negative for: history of anesthetic complications  Airway Mallampati: III  TM Distance: >3 FB Neck ROM: Full    Dental  (+) Caps   Pulmonary neg sleep apnea, neg COPD, former smoker,    breath sounds clear to auscultation- rhonchi (-) wheezing      Cardiovascular Exercise Tolerance: Good (-) hypertension(-) CAD and (-) Past MI  Rhythm:Regular Rate:Normal - Systolic murmurs and - Diastolic murmurs    Neuro/Psych Seizures -,  Anxiety    GI/Hepatic negative GI ROS, Neg liver ROS,   Endo/Other  negative endocrine ROSneg diabetes  Renal/GU negative Renal ROS     Musculoskeletal  (+) Arthritis ,   Abdominal (+) + obese,   Peds  Hematology negative hematology ROS (+)   Anesthesia Other Findings Past Medical History: No date: Allergic rhinitis No date: Anxiety No date: Arthritis     Comment: all over...knees, ankles, back No date: Cervical radiculitis No date: Constipation No date: Constipation No date: DDD (degenerative disc disease), cervical No date: Osteoarthritis No date: Plantar fasciitis     Comment: bilaterally No date: Seizures ([place])     Comment: pt states that her last seizure was 6 months               ago.states she is taking her medicine No date: Urinary incontinence   Reproductive/Obstetrics                             Anesthesia Physical Anesthesia Plan  ASA: II  Anesthesia Plan: General   Post-op Pain Management:    Induction: Intravenous  Airway Management Planned: Natural Airway  Additional Equipment:   Intra-op Plan:   Post-operative Plan:   Informed Consent: I have reviewed the patients History and Physical, chart, labs and  discussed the procedure including the risks, benefits and alternatives for the proposed anesthesia with the patient or authorized representative who has indicated his/her understanding and acceptance.   Dental advisory given  Plan Discussed with: CRNA and Anesthesiologist  Anesthesia Plan Comments:         Anesthesia Quick Evaluation

## 2016-09-29 NOTE — Anesthesia Post-op Follow-up Note (Cosign Needed)
Anesthesia QCDR form completed.        

## 2016-09-29 NOTE — Transfer of Care (Signed)
Immediate Anesthesia Transfer of Care Note  Patient: Cindy Mcclure  Procedure(s) Performed: Procedure(s): COLONOSCOPY WITH PROPOFOL (N/A)  Patient Location: PACU and Endoscopy Unit  Anesthesia Type:General  Level of Consciousness: sedated  Airway & Oxygen Therapy: Patient Spontanous Breathing and Patient connected to nasal cannula oxygen  Post-op Assessment: Report given to RN and Post -op Vital signs reviewed and stable  Post vital signs: Reviewed and stable  Last Vitals:  Vitals:   09/29/16 0717  BP: 102/78  Pulse: 90  Resp: 14  Temp: (!) 35.9 C    Last Pain:  Vitals:   09/29/16 0717  TempSrc: Tympanic         Complications: No apparent anesthesia complications

## 2016-09-29 NOTE — Op Note (Signed)
[facility] Gastroenterology Patient Name: Cindy Mcclure Procedure Date: 09/29/2016 8:36 AM MRN: [id number] Account #: [id number] Date of Birth: [birth year] Admit Type: Outpatient Age: 51 Room: [facility] ENDO ROOM 1 Gender: Female Note Status: Finalized Procedure:            Colonoscopy Indications:          Constipation Providers:            Manya Silvas, MD Referring MD:         Ankit A. Posey Pronto (Referring MD) Medicines:            Propofol per Anesthesia Complications:        No immediate complications. Procedure:            Pre-Anesthesia Assessment:                       - After reviewing the risks and benefits, the patient                        was deemed in satisfactory condition to undergo the                        procedure.                       After obtaining informed consent, the colonoscope was                        passed under direct vision. Throughout the procedure,                        the patient's blood pressure, pulse, and oxygen                        saturations were monitored continuously. The                        Colonoscope was introduced through the anus and                        advanced to the the cecum, identified by appendiceal                        orifice and ileocecal valve. The colonoscopy was                        performed without difficulty. The patient tolerated the                        procedure well. The quality of the bowel preparation                        was excellent. Findings:      Terminal ileum was normal      A diffuse and patchy area of minimal-mildly erythematous mucosa was       found in the sigmoid colon. This was biopsied with a cold forceps for       histology.      A diffuse area of minimal granular mucosa was found in the rectum.       Biopsies were taken with a cold forceps for histology.  Internal hemorrhoids were found during endoscopy. The hemorrhoids were       small and Grade I  (internal hemorrhoids that do not prolapse).      The exam was otherwise without abnormality. Impression:           - Erythematous mucosa in the sigmoid colon. Biopsied.                       - Granularity in the rectum. Biopsied.                       - Internal hemorrhoids.                       - The examination was otherwise normal. Recommendation:       - Await pathology results. Manya Silvas, MD 09/29/2016 8:59:55 AM This report has been signed electronically. Number of Addenda: 0 Note Initiated On: 09/29/2016 8:36 AM Scope Withdrawal Time: 0 hours 8 minutes 49 seconds  Total Procedure Duration: 0 hours 13 minutes 0 seconds       [facility]

## 2016-09-29 NOTE — Anesthesia Postprocedure Evaluation (Signed)
Anesthesia Post Note  Patient: Cindy Mcclure  Procedure(s) Performed: Procedure(s) (LRB): COLONOSCOPY WITH PROPOFOL (N/A)  Patient location during evaluation: Endoscopy Anesthesia Type: General Level of consciousness: awake and alert and oriented Pain management: pain level controlled Vital Signs Assessment: post-procedure vital signs reviewed and stable Respiratory status: spontaneous breathing, nonlabored ventilation and respiratory function stable Cardiovascular status: blood pressure returned to baseline and stable Postop Assessment: no signs of nausea or vomiting Anesthetic complications: no     Last Vitals:  Vitals:   09/29/16 0911 09/29/16 0921  BP: 92/62 92/63  Pulse: 79 69  Resp: 13 14  Temp:      Last Pain:  Vitals:   09/29/16 0901  TempSrc: Tympanic                 Jabril Pursell

## 2016-09-30 ENCOUNTER — Encounter: Payer: Self-pay | Admitting: Unknown Physician Specialty

## 2016-09-30 LAB — SURGICAL PATHOLOGY

## 2016-10-03 ENCOUNTER — Other Ambulatory Visit: Payer: Self-pay | Admitting: Orthopedic Surgery

## 2016-10-03 DIAGNOSIS — M222X2 Patellofemoral disorders, left knee: Secondary | ICD-10-CM

## 2016-10-15 ENCOUNTER — Ambulatory Visit
Admission: RE | Admit: 2016-10-15 | Discharge: 2016-10-15 | Disposition: A | Discharge status: Home / Self Care | Payer: Medicare Other | Source: Ambulatory Visit | Attending: Orthopedic Surgery | Admitting: Orthopedic Surgery

## 2016-10-15 DIAGNOSIS — M222X2 Patellofemoral disorders, left knee: Secondary | ICD-10-CM | POA: Insufficient documentation

## 2016-10-15 DIAGNOSIS — M222X1 Patellofemoral disorders, right knee: Secondary | ICD-10-CM | POA: Diagnosis not present

## 2016-10-15 DIAGNOSIS — M23201 Derangement of unspecified lateral meniscus due to old tear or injury, left knee: Secondary | ICD-10-CM | POA: Insufficient documentation

## 2016-11-18 ENCOUNTER — Encounter
Admission: RE | Admit: 2016-11-18 | Discharge: 2016-11-18 | Disposition: A | Discharge status: Home / Self Care | Payer: Medicare Other | Source: Ambulatory Visit | Attending: Orthopedic Surgery | Admitting: Orthopedic Surgery

## 2016-11-18 DIAGNOSIS — X58XXXA Exposure to other specified factors, initial encounter: Secondary | ICD-10-CM | POA: Diagnosis not present

## 2016-11-18 DIAGNOSIS — S83262A Peripheral tear of lateral meniscus, current injury, left knee, initial encounter: Secondary | ICD-10-CM | POA: Diagnosis not present

## 2016-11-18 DIAGNOSIS — R9431 Abnormal electrocardiogram [ECG] [EKG]: Secondary | ICD-10-CM | POA: Diagnosis not present

## 2016-11-18 DIAGNOSIS — Z01818 Encounter for other preprocedural examination: Secondary | ICD-10-CM | POA: Insufficient documentation

## 2016-11-18 DIAGNOSIS — Z01812 Encounter for preprocedural laboratory examination: Secondary | ICD-10-CM | POA: Insufficient documentation

## 2016-11-18 HISTORY — DX: Stress incontinence (female) (male): N39.3

## 2016-11-18 HISTORY — DX: Dorsalgia, unspecified: M54.9

## 2016-11-18 LAB — CBC
HEMATOCRIT: 39.8 % (ref 35.0–47.0)
Hemoglobin: 13.5 g/dL (ref 12.0–16.0)
MCH: 30.9 pg (ref 26.0–34.0)
MCHC: 34 g/dL (ref 32.0–36.0)
MCV: 90.8 fL (ref 80.0–100.0)
PLATELETS: 298 10*3/uL (ref 150–440)
RBC: 4.38 MIL/uL (ref 3.80–5.20)
RDW: 13.7 % (ref 11.5–14.5)
WBC: 7 10*3/uL (ref 3.6–11.0)

## 2016-11-18 LAB — BASIC METABOLIC PANEL
Anion gap: 6 (ref 5–15)
BUN: 11 mg/dL (ref 6–20)
CO2: 26 mmol/L (ref 22–32)
Calcium: 9.2 mg/dL (ref 8.9–10.3)
Chloride: 110 mmol/L (ref 101–111)
Creatinine, Ser: 1.17 mg/dL — ABNORMAL HIGH (ref 0.44–1.00)
GFR calc Af Amer: >60 mL/min (ref >60)
GFR, EST NON AFRICAN AMERICAN: 53 mL/min — AB (ref >60)
GLUCOSE: 89 mg/dL (ref 65–99)
POTASSIUM: 3.8 mmol/L (ref 3.5–5.1)
Sodium: 142 mmol/L (ref 135–145)

## 2016-11-18 NOTE — Patient Instructions (Signed)
Your procedure is scheduled on: 11/27/16 Thurs Report to Same Day Surgery 2nd floor medical mall [facility] Entrance-take elevator on left to 2nd floor.  Check in with surgery information desk.) To find out your arrival time please call [phone] between 1PM - 3PM on 11/26/16 Wed  Remember: Instructions that are not followed completely may result in serious medical risk, up to and including death, or upon the discretion of your surgeon and anesthesiologist your surgery may need to be rescheduled.    _x___ 1. Do not eat food or drink liquids after midnight. No gum chewing or                              hard candies.     __x__ 2. No Alcohol for 24 hours before or after surgery.   __x__3. No Smoking for 24 prior to surgery.   ____  4. Bring all medications with you on the day of surgery if instructed.    __x__ 5. Notify your doctor if there is any change in your medical condition     (cold, fever, infections).     Do not wear jewelry, make-up, hairpins, clips or nail polish.  Do not wear lotions, powders, or perfumes. You may wear deodorant.  Do not shave 48 hours prior to surgery. Men may shave face and neck.  Do not bring valuables to the hospital.    [facility] is not responsible for any belongings or valuables.               Contacts, dentures or bridgework may not be worn into surgery.  Leave your suitcase in the car. After surgery it may be brought to your room.  For patients admitted to the hospital, discharge time is determined by your                       treatment team.   Patients discharged the day of surgery will not be allowed to drive home.  You will need someone to drive you home and stay with you the night of your procedure.    Please read over the following fact sheets that you were given:   [facility] Preparing for Surgery and or MRSA Information   _x___ Take anti-hypertensive (unless it includes a diuretic), cardiac, seizure, asthma,     anti-reflux and  psychiatric medicines. These include:  1. lamoTRIgine (LAMICTAL  2.Levetiracetam (KEPPRA XR)   3.traMADol (ULTRAM if needed  4.  5.  6.  ____Fleets enema or Magnesium Citrate as directed.   _x___ Use CHG Soap or sage wipes as directed on instruction sheet   ____ Use inhalers on the day of surgery and bring to hospital day of surgery  ____ Stop Metformin and Janumet 2 days prior to surgery.    ____ Take 1/2 of usual insulin dose the night before surgery and none on the morning     surgery.   _x___ Follow recommendations from Cardiologist, Pulmonologist or PCP regarding          stopping Aspirin, Coumadin, Pllavix ,Eliquis, Effient, or Pradaxa, and Pletal.  X____Stop Anti-inflammatories such as Advil, Aleve, Ibuprofen, Motrin, Naproxen, Naprosyn, Goodies powders or aspirin products. OK to take Tylenol and                          Celebrex.   _x___ Stop supplements until after surgery.  But may continue Vitamin D, Vitamin B,       and multivitamin.   ____ Bring C-Pap to the hospital.

## 2016-11-26 MED ORDER — CEFAZOLIN SODIUM-DEXTROSE 2-4 GM/100ML-% IV SOLN
2.0000 g | Freq: Once | INTRAVENOUS | Status: AC
  Administered 2016-11-27: 2 g via INTRAVENOUS

## 2016-11-26 MED ORDER — FAMOTIDINE 20 MG PO TABS
20.0000 mg | ORAL_TABLET | Freq: Once | ORAL | Status: AC
  Administered 2016-11-27: 20 mg via ORAL

## 2016-11-27 ENCOUNTER — Encounter: Payer: Self-pay | Admitting: *Deleted

## 2016-11-27 ENCOUNTER — Ambulatory Visit: Payer: Medicare Other | Admitting: Anesthesiology

## 2016-11-27 ENCOUNTER — Ambulatory Visit
Admission: RE | Admit: 2016-11-27 | Discharge: 2016-11-27 | Disposition: A | Discharge status: Home / Self Care | Payer: Medicare Other | Source: Ambulatory Visit | Attending: Orthopedic Surgery | Admitting: Orthopedic Surgery

## 2016-11-27 ENCOUNTER — Encounter
Admission: RE | Disposition: A | Discharge status: Home / Self Care | Payer: Self-pay | Source: Ambulatory Visit | Attending: Orthopedic Surgery

## 2016-11-27 DIAGNOSIS — M25562 Pain in left knee: Secondary | ICD-10-CM | POA: Diagnosis present

## 2016-11-27 DIAGNOSIS — Z79899 Other long term (current) drug therapy: Secondary | ICD-10-CM | POA: Diagnosis not present

## 2016-11-27 DIAGNOSIS — Z87891 Personal history of nicotine dependence: Secondary | ICD-10-CM | POA: Diagnosis not present

## 2016-11-27 DIAGNOSIS — M6752 Plica syndrome, left knee: Secondary | ICD-10-CM | POA: Diagnosis not present

## 2016-11-27 DIAGNOSIS — R569 Unspecified convulsions: Secondary | ICD-10-CM | POA: Diagnosis not present

## 2016-11-27 DIAGNOSIS — M659 Synovitis and tenosynovitis, unspecified: Secondary | ICD-10-CM | POA: Diagnosis not present

## 2016-11-27 DIAGNOSIS — Z419 Encounter for procedure for purposes other than remedying health state, unspecified: Secondary | ICD-10-CM

## 2016-11-27 HISTORY — PX: KNEE ARTHROSCOPY WITH EXCISION PLICA: SHX5647

## 2016-11-27 SURGERY — ARTHROSCOPY, KNEE, WITH PLICA EXCISION
Anesthesia: General | Site: Knee | Laterality: Left | Wound class: Clean

## 2016-11-27 MED ORDER — ONDANSETRON HCL 4 MG/2ML IJ SOLN: 4.0000 mg | Freq: Once | INTRAMUSCULAR | Status: DC | PRN

## 2016-11-27 MED ORDER — PROPOFOL 10 MG/ML IV BOLUS
INTRAVENOUS | Status: AC
  Filled 2016-11-27: qty 20

## 2016-11-27 MED ORDER — LIDOCAINE HCL (CARDIAC) 20 MG/ML IV SOLN
INTRAVENOUS | Status: DC | PRN
  Administered 2016-11-27: 100 mg via INTRAVENOUS

## 2016-11-27 MED ORDER — FENTANYL CITRATE (PF) 100 MCG/2ML IJ SOLN
INTRAMUSCULAR | Status: AC
  Filled 2016-11-27: qty 2

## 2016-11-27 MED ORDER — PHENYLEPHRINE HCL 10 MG/ML IJ SOLN
INTRAMUSCULAR | Status: DC | PRN
  Administered 2016-11-27: 100 ug via INTRAVENOUS

## 2016-11-27 MED ORDER — DEXAMETHASONE SODIUM PHOSPHATE 10 MG/ML IJ SOLN
INTRAMUSCULAR | Status: AC
  Filled 2016-11-27: qty 1

## 2016-11-27 MED ORDER — BUPIVACAINE-EPINEPHRINE (PF) 0.5% -1:200000 IJ SOLN
INTRAMUSCULAR | Status: AC
  Filled 2016-11-27: qty 30

## 2016-11-27 MED ORDER — MIDAZOLAM HCL 2 MG/2ML IJ SOLN
INTRAMUSCULAR | Status: DC | PRN
  Administered 2016-11-27: 2 mg via INTRAVENOUS

## 2016-11-27 MED ORDER — FENTANYL CITRATE (PF) 100 MCG/2ML IJ SOLN
25.0000 ug | INTRAMUSCULAR | Status: AC | PRN
Start: 2016-11-27 — End: 2016-11-27
  Administered 2016-11-27 (×6): 25 ug via INTRAVENOUS

## 2016-11-27 MED ORDER — LACTATED RINGERS IV SOLN
INTRAVENOUS | Status: DC
  Administered 2016-11-27 (×2): via INTRAVENOUS

## 2016-11-27 MED ORDER — DEXAMETHASONE SODIUM PHOSPHATE 10 MG/ML IJ SOLN
INTRAMUSCULAR | Status: DC | PRN
  Administered 2016-11-27: 10 mg via INTRAVENOUS

## 2016-11-27 MED ORDER — FAMOTIDINE 20 MG PO TABS
ORAL_TABLET | ORAL | Status: AC
  Filled 2016-11-27: qty 1

## 2016-11-27 MED ORDER — MIDAZOLAM HCL 2 MG/2ML IJ SOLN
INTRAMUSCULAR | Status: AC
  Filled 2016-11-27: qty 2

## 2016-11-27 MED ORDER — LIDOCAINE HCL (PF) 2 % IJ SOLN
INTRAMUSCULAR | Status: AC
  Filled 2016-11-27: qty 2

## 2016-11-27 MED ORDER — FENTANYL CITRATE (PF) 100 MCG/2ML IJ SOLN
INTRAMUSCULAR | Status: DC | PRN
  Administered 2016-11-27: 100 ug via INTRAVENOUS

## 2016-11-27 MED ORDER — ONDANSETRON HCL 4 MG/2ML IJ SOLN
INTRAMUSCULAR | Status: AC
  Filled 2016-11-27: qty 2

## 2016-11-27 MED ORDER — FENTANYL CITRATE (PF) 100 MCG/2ML IJ SOLN
INTRAMUSCULAR | Status: AC
  Administered 2016-11-27: 25 ug via INTRAVENOUS
  Filled 2016-11-27: qty 2

## 2016-11-27 MED ORDER — KETOROLAC TROMETHAMINE 30 MG/ML IJ SOLN
INTRAMUSCULAR | Status: AC
  Filled 2016-11-27: qty 1

## 2016-11-27 MED ORDER — HYDROCODONE-ACETAMINOPHEN 5-325 MG PO TABS
ORAL_TABLET | ORAL | Status: AC
  Administered 2016-11-27: 1 via ORAL
  Filled 2016-11-27: qty 1

## 2016-11-27 MED ORDER — ONDANSETRON HCL 4 MG/2ML IJ SOLN
INTRAMUSCULAR | Status: DC | PRN
  Administered 2016-11-27: 4 mg via INTRAVENOUS

## 2016-11-27 MED ORDER — PROPOFOL 10 MG/ML IV BOLUS
INTRAVENOUS | Status: DC | PRN
  Administered 2016-11-27: 160 mg via INTRAVENOUS

## 2016-11-27 MED ORDER — BUPIVACAINE-EPINEPHRINE (PF) 0.5% -1:200000 IJ SOLN
INTRAMUSCULAR | Status: DC | PRN
  Administered 2016-11-27: 20 mL

## 2016-11-27 MED ORDER — HYDROCODONE-ACETAMINOPHEN 5-325 MG PO TABS
1.0000 | ORAL_TABLET | ORAL | Status: DC | PRN
  Administered 2016-11-27: 1 via ORAL

## 2016-11-27 MED ORDER — ACETAMINOPHEN 10 MG/ML IV SOLN
INTRAVENOUS | Status: AC
  Filled 2016-11-27: qty 100

## 2016-11-27 MED ORDER — HYDROCODONE-ACETAMINOPHEN 5-325 MG PO TABS: 1.0000 | ORAL_TABLET | ORAL | 0 refills | Status: DC | PRN

## 2016-11-27 MED ORDER — CEFAZOLIN SODIUM-DEXTROSE 2-4 GM/100ML-% IV SOLN
INTRAVENOUS | Status: AC
  Filled 2016-11-27: qty 100

## 2016-11-27 SURGICAL SUPPLY — 29 items
BANDAGE ACE 4X5 VEL STRL LF (GAUZE/BANDAGES/DRESSINGS) IMPLANT
BANDAGE ELASTIC 4 LF NS (GAUZE/BANDAGES/DRESSINGS) ×3 IMPLANT
BLADE FULL RADIUS 3.5 (BLADE) IMPLANT
BLADE INCISOR PLUS 4.5 (BLADE) IMPLANT
BLADE SHAVER 4.5 DBL SERAT CV (CUTTER) IMPLANT
BLADE SHAVER 4.5X7 STR FR (MISCELLANEOUS) IMPLANT
BNDG CMPR MED 5X4 ELC HKLP NS (GAUZE/BANDAGES/DRESSINGS) ×1
CHLORAPREP W/TINT 26ML (MISCELLANEOUS) ×3 IMPLANT
CUFF TOURN 24 STER (MISCELLANEOUS) IMPLANT
CUFF TOURN 30 STER DUAL PORT (MISCELLANEOUS) IMPLANT
GAUZE SPONGE 4X4 12PLY STRL (GAUZE/BANDAGES/DRESSINGS) ×3 IMPLANT
GLOVE SURG SYN 9.0  PF PI (GLOVE) ×2
GLOVE SURG SYN 9.0 PF PI (GLOVE) ×1 IMPLANT
GOWN SRG 2XL LVL 4 RGLN SLV (GOWNS) ×1 IMPLANT
GOWN STRL NON-REIN 2XL LVL4 (GOWNS) ×3
GOWN STRL REUS W/ TWL LRG LVL3 (GOWN DISPOSABLE) ×2 IMPLANT
GOWN STRL REUS W/TWL LRG LVL3 (GOWN DISPOSABLE) ×6
IV LACTATED RINGER IRRG 3000ML (IV SOLUTION) ×6
IV LR IRRIG 3000ML ARTHROMATIC (IV SOLUTION) ×2 IMPLANT
KIT RM TURNOVER STRD PROC AR (KITS) ×3 IMPLANT
MANIFOLD NEPTUNE II (INSTRUMENTS) ×3 IMPLANT
PACK ARTHROSCOPY KNEE (MISCELLANEOUS) ×3 IMPLANT
SET TUBE SUCT SHAVER OUTFL 24K (TUBING) ×3 IMPLANT
SET TUBE TIP INTRA-ARTICULAR (MISCELLANEOUS) ×3 IMPLANT
SUT ETHILON 4-0 (SUTURE) ×3
SUT ETHILON 4-0 FS2 18XMFL BLK (SUTURE) ×1
SUTURE ETHLN 4-0 FS2 18XMF BLK (SUTURE) ×1 IMPLANT
TUBING ARTHRO INFLOW-ONLY STRL (TUBING) ×3 IMPLANT
WAND HAND CNTRL MULTIVAC 50 (MISCELLANEOUS) ×3 IMPLANT

## 2016-11-27 NOTE — Progress Notes (Signed)
Report to Nikki RN

## 2016-11-27 NOTE — Anesthesia Preprocedure Evaluation (Signed)
Anesthesia Evaluation  Patient identified by MRN, date of birth, ID band Patient awake    Reviewed: Allergy & Precautions, NPO status , Patient's Chart, lab work & pertinent test results  History of Anesthesia Complications Negative for: history of anesthetic complications  Airway Mallampati: II       Dental   Pulmonary neg pulmonary ROS, former smoker,           Cardiovascular negative cardio ROS       Neuro/Psych Seizures -, Well Controlled,  Anxiety    GI/Hepatic negative GI ROS, Neg liver ROS,   Endo/Other  negative endocrine ROS  Renal/GU negative Renal ROS     Musculoskeletal   Abdominal   Peds  Hematology   Anesthesia Other Findings   Reproductive/Obstetrics                             Anesthesia Physical Anesthesia Plan  ASA: III  Anesthesia Plan: General   Post-op Pain Management:    Induction: Intravenous  Airway Management Planned: LMA  Additional Equipment:   Intra-op Plan:   Post-operative Plan:   Informed Consent: I have reviewed the patients History and Physical, chart, labs and discussed the procedure including the risks, benefits and alternatives for the proposed anesthesia with the patient or authorized representative who has indicated his/her understanding and acceptance.     Plan Discussed with:   Anesthesia Plan Comments:         Anesthesia Quick Evaluation

## 2016-11-27 NOTE — Anesthesia Post-op Follow-up Note (Cosign Needed)
Anesthesia QCDR form completed.        

## 2016-11-27 NOTE — Discharge Instructions (Addendum)
Activity through the weekend. Take pain medicine as directed. Aspirin 325 mg daily. Keep dressing clean and dry   AMBULATORY SURGERY  DISCHARGE INSTRUCTIONS   1) The drugs that you were given will stay in your system until tomorrow so for the next 24 hours you should not:  A) Drive an automobile B) Make any legal decisions C) Drink any alcoholic beverage   2) You may resume regular meals tomorrow.  Today it is better to start with liquids and gradually work up to solid foods.  You may eat anything you prefer, but it is better to start with liquids, then soup and crackers, and gradually work up to solid foods.   3) Please notify your doctor immediately if you have any unusual bleeding, trouble breathing, redness and pain at the surgery site, drainage, fever, or pain not relieved by medication.   4) Additional Instructions: Remember RICE: R=rest, I=ice, C= compression(ace wrap) and E=elevate

## 2016-11-27 NOTE — Anesthesia Postprocedure Evaluation (Signed)
Anesthesia Post Note  Patient: Sharni Negron Blaydes  Procedure(s) Performed: Procedure(s) (LRB): KNEE ARTHROSCOPY WITH EXCISION PLICA, PARTIAL SYNOVECTOMY (Left)  Patient location during evaluation: PACU Anesthesia Type: General Level of consciousness: awake and alert Pain management: pain level controlled Vital Signs Assessment: post-procedure vital signs reviewed and stable Respiratory status: spontaneous breathing, nonlabored ventilation, respiratory function stable and patient connected to nasal cannula oxygen Cardiovascular status: blood pressure returned to baseline and stable Postop Assessment: no signs of nausea or vomiting Anesthetic complications: no     Last Vitals:  Vitals:   11/27/16 1731 11/27/16 1832  BP:  116/81  Pulse: 74 79  Resp: 16 18  Temp: 36.2 C     Last Pain:  Vitals:   11/27/16 1832  TempSrc:   PainSc: 2                  Ninah Moccio S

## 2016-11-27 NOTE — Anesthesia Procedure Notes (Signed)
Procedure Name: LMA Insertion Date/Time: 11/27/2016 3:14 PM Performed by: Nelda Marseille Pre-anesthesia Checklist: Patient identified, Patient being monitored, Timeout performed, Emergency Drugs available and Suction available Patient Re-evaluated:Patient Re-evaluated prior to inductionOxygen Delivery Method: Circle system utilized Preoxygenation: Pre-oxygenation with 100% oxygen Intubation Type: IV induction Ventilation: Mask ventilation without difficulty LMA: LMA inserted LMA Size: 3.5 Tube type: Oral Number of attempts: 1 Placement Confirmation: positive ETCO2 and breath sounds checked- equal and bilateral Tube secured with: Tape Dental Injury: Teeth and Oropharynx as per pre-operative assessment

## 2016-11-27 NOTE — Transfer of Care (Signed)
Immediate Anesthesia Transfer of Care Note  Patient: Cindy Mcclure  Procedure(s) Performed: Procedure(s): KNEE ARTHROSCOPY WITH EXCISION PLICA, PARTIAL SYNOVECTOMY (Left)  Patient Location: PACU  Anesthesia Type:General  Level of Consciousness: sedated  Airway & Oxygen Therapy: Patient Spontanous Breathing and Patient connected to face mask oxygen  Post-op Assessment: Report given to RN and Post -op Vital signs reviewed and stable  Post vital signs: Reviewed and stable  Last Vitals:  Vitals:   11/27/16 0829 11/27/16 1159  BP: 115/75 108/71  Pulse: (!) 115 (!) 115  Resp: 16 18  Temp: (!) 36 C 37 C    Last Pain:  Vitals:   11/27/16 1159  TempSrc: Tympanic  PainSc:          Complications: No apparent anesthesia complications

## 2016-11-27 NOTE — H&P (Signed)
Reviewed paper H+P, will be scanned into chart. Patient examined No changes noted.  

## 2016-11-27 NOTE — Op Note (Signed)
11/27/2016  3:47 PM  PATIENT:  Cindy Mcclure  51 y.o. female  PRE-OPERATIVE DIAGNOSIS:  peripheral tear of lateral meniscus of left knee  POST-OPERATIVE DIAGNOSIS:   left knee, plice  PROCEDURE:  Procedure(s): KNEE ARTHROSCOPY WITH EXCISION PLICA, PARTIAL SYNOVECTOMY (Left)  SURGEON: Laurene Footman, MD  ASSISTANTS: None  ANESTHESIA:   general  EBL:  Total I/O In: 300 [I.V.:300] Out: 1 [Blood:1]  BLOOD ADMINISTERED:none  DRAINS: none   LOCAL MEDICATIONS USED:  MARCAINE     SPECIMEN:  No Specimen  DISPOSITION OF SPECIMEN:  N/A  COUNTS:  YES  TOURNIQUET:    IMPLANTS: None  DICTATION: .Dragon Dictation patient brought the operating room and after adequate anesthesia was obtained the left leg was placed in arthroscopic leg holder with tourniquet applied but not required. After patient identification and timeout procedure completed having prepped and draped the leg and inferolateral portal was made. Inspection revealed extensive synovitis in the suprapatellar pouch and impinging at the patellofemoral joint up from the fat pad. The patellofemoral joint appeared relatively normal just a superficial small areas of fissuring was some fibrillation of the superior pole of the patella there were no Loose bodies in the gutters corral medial compartment inferior medial portal was made medial compartment was normal in appearance with intact articular and meniscal cartilage. Anterior cruciate ligament also intact with extensive synovitis in the anterior portion of the knee lateral compartment where the MRI and shown of possible radial tear did not show a radial tear to probing the tear the meniscus appeared intact throughout the articular cartilage was essentially normal as well. Minimal lateral fibrillation changes at this point the wand was used to ablate the plica that impinged at the patellofemoral joint as well as debriding the synovium in the suprapatellar pouch after this all had been  addressed the knee was irrigated until clear argentation was withdrawn. 4-0 nylon for skin closure followed by 20 cc half percent Sensorcaine with epinephrine and there is a portal for postop analgesia. Xeroform 4 x 4 web roll and Ace wrap applied  PLAN OF CARE: Discharge to home after PACU  PATIENT DISPOSITION:  PACU - hemodynamically stable.

## 2016-11-28 ENCOUNTER — Encounter: Payer: Self-pay | Admitting: Orthopedic Surgery

## 2017-07-31 ENCOUNTER — Other Ambulatory Visit: Payer: Self-pay | Admitting: Orthopedic Surgery

## 2017-07-31 DIAGNOSIS — M25561 Pain in right knee: Secondary | ICD-10-CM

## 2017-08-12 ENCOUNTER — Ambulatory Visit
Admission: RE | Admit: 2017-08-12 | Discharge: 2017-08-12 | Disposition: A | Discharge status: Home / Self Care | Payer: Medicare Other | Source: Ambulatory Visit | Attending: Orthopedic Surgery | Admitting: Orthopedic Surgery

## 2017-08-12 DIAGNOSIS — M25561 Pain in right knee: Secondary | ICD-10-CM | POA: Diagnosis not present

## 2017-08-18 DIAGNOSIS — B351 Tinea unguium: Secondary | ICD-10-CM

## 2017-08-18 HISTORY — DX: Tinea unguium: B35.1

## 2017-08-25 ENCOUNTER — Inpatient Hospital Stay: Admission: RE | Admit: 2017-08-25 | Payer: Medicare Other | Source: Ambulatory Visit

## 2017-08-27 ENCOUNTER — Other Ambulatory Visit: Payer: Self-pay

## 2017-08-27 ENCOUNTER — Encounter
Admission: RE | Admit: 2017-08-27 | Discharge: 2017-08-27 | Disposition: A | Discharge status: Home / Self Care | Payer: Medicare Other | Source: Ambulatory Visit | Attending: Orthopedic Surgery | Admitting: Orthopedic Surgery

## 2017-08-27 DIAGNOSIS — Z01812 Encounter for preprocedural laboratory examination: Secondary | ICD-10-CM | POA: Insufficient documentation

## 2017-08-27 DIAGNOSIS — G40209 Localization-related (focal) (partial) symptomatic epilepsy and epileptic syndromes with complex partial seizures, not intractable, without status epilepticus: Secondary | ICD-10-CM | POA: Insufficient documentation

## 2017-08-27 DIAGNOSIS — M1710 Unilateral primary osteoarthritis, unspecified knee: Secondary | ICD-10-CM | POA: Diagnosis not present

## 2017-08-27 DIAGNOSIS — N3946 Mixed incontinence: Secondary | ICD-10-CM | POA: Insufficient documentation

## 2017-08-27 DIAGNOSIS — M5030 Other cervical disc degeneration, unspecified cervical region: Secondary | ICD-10-CM | POA: Insufficient documentation

## 2017-08-27 DIAGNOSIS — M5412 Radiculopathy, cervical region: Secondary | ICD-10-CM | POA: Diagnosis not present

## 2017-08-27 DIAGNOSIS — Z5181 Encounter for therapeutic drug level monitoring: Secondary | ICD-10-CM | POA: Insufficient documentation

## 2017-08-27 DIAGNOSIS — K819 Cholecystitis, unspecified: Secondary | ICD-10-CM | POA: Insufficient documentation

## 2017-08-27 DIAGNOSIS — K8010 Calculus of gallbladder with chronic cholecystitis without obstruction: Secondary | ICD-10-CM | POA: Diagnosis not present

## 2017-08-27 HISTORY — DX: Tinea unguium: B35.1

## 2017-08-27 LAB — BASIC METABOLIC PANEL
Anion gap: 7 (ref 5–15)
BUN: 16 mg/dL (ref 6–20)
CALCIUM: 9.2 mg/dL (ref 8.9–10.3)
CO2: 26 mmol/L (ref 22–32)
CREATININE: 1.01 mg/dL — AB (ref 0.44–1.00)
Chloride: 107 mmol/L (ref 101–111)
GFR calc Af Amer: >60 mL/min (ref >60)
GFR calc non Af Amer: >60 mL/min (ref >60)
GLUCOSE: 91 mg/dL (ref 65–99)
Potassium: 4.2 mmol/L (ref 3.5–5.1)
Sodium: 140 mmol/L (ref 135–145)

## 2017-08-27 LAB — CBC
HEMATOCRIT: 38.1 % (ref 35.0–47.0)
Hemoglobin: 12.7 g/dL (ref 12.0–16.0)
MCH: 30.9 pg (ref 26.0–34.0)
MCHC: 33.4 g/dL (ref 32.0–36.0)
MCV: 92.6 fL (ref 80.0–100.0)
Platelets: 324 10*3/uL (ref 150–440)
RBC: 4.12 MIL/uL (ref 3.80–5.20)
RDW: 13.5 % (ref 11.5–14.5)
WBC: 9.9 10*3/uL (ref 3.6–11.0)

## 2017-08-27 NOTE — Pre-Procedure Instructions (Signed)
Patient states that she has crutches and a walker and is familiar with how to use both of these.

## 2017-08-27 NOTE — Patient Instructions (Signed)
Your procedure is scheduled on: Thursday, September 03, 2017  Report to [facility]  To find out your arrival time please call [phone] between 1PM - 3PM on Wednesday, January 16,2019  Remember: Instructions that are not followed completely may result in serious medical risk, up to and including death, or upon the discretion of your surgeon and anesthesiologist your surgery may need to be rescheduled.     _X__ 1. Do not eat food after midnight the night before your procedure.                 No gum chewing or hard candies. You may drink clear liquids up to 2 hours                 before you are scheduled to arrive for your surgery- DO not drink clear                 liquids within 2 hours of the start of your surgery.                 Clear Liquids include:  water, apple juice without pulp, clear carbohydrate                 drink such as Clearfast of Gartorade, Black Coffee or Tea (Do not add                 anything to coffee or tea).     _X__ 2.  No Alcohol for 24 hours before or after surgery.   _X__ 3.  Do Not Smoke or use e-cigarettes For 24 Hours Prior to Your Surgery.                 Do not use any chewable tobacco products for at least 6 hours prior to                 surgery.  ____  4.  Bring all medications with you on the day of surgery if instructed.   __X__  5.  Notify your doctor if there is any change in your medical condition      (cold, fever, infections).     Do not wear jewelry, make-up, hairpins, clips or nail polish. Do not wear lotions, powders, or perfumes. You may wear deodorant. Do not shave 48 hours prior to surgery. Men may shave face and neck. Do not bring valuables to the hospital.    [facility] is not responsible for any belongings or valuables.  Contacts, dentures or bridgework may not be worn into surgery. Leave your suitcase in the car. After surgery it may be brought to your room. For  patients admitted to the hospital, discharge time is determined by your treatment team.   Patients discharged the day of surgery will not be allowed to drive home.   Please read over the following fact sheets that you were given:   [place]   ____ Take these medicines the morning of surgery with A SIP OF WATER:    1. KEPPRA  2. LAMICTAL  3. TRAMADOL OR TYLENOL IF NEEDED  4.  5.  6.  ____ Fleet Enema (as directed)   __X__ Use CHG Soap as directed  ____ Use inhalers on the day of surgery  ____ Stop metformin 2 days prior  to surgery    ____ Take 1/2 of usual insulin dose the night before surgery. No insulin the morning          of surgery.   ____ Stop Coumadin/Plavix/aspirin AS OF TODAY.  ____ Stop Anti-inflammatories AS OF TODAY.  THIS INCLUDES IBUPROFEN / MOTRIN / ALEVE / ADVIL / GOODIES POWDERS / NAPROSYN   ____ Stop supplements until after surgery.  THIS INCLUDES VITAMIN A,B,C,DE AND  FLAXSEED OIL, BIOTIN  ____ Bring C-Pap to the hospital.   CONTINUE TO TAKE THE OTHER PRESCRIPTION MEDICATIONS AS USUAL, JUST DO NOT TAKE ON THE DAY OF SURGERY.  YOU MAY TAKE TYLENOL AND/OR TRAMADOL FOR PAIN.    STOP THE VOLTAREN (DICOFENEC) AS OF TODAY

## 2017-09-03 ENCOUNTER — Ambulatory Visit
Admission: RE | Admit: 2017-09-03 | Discharge: 2017-09-03 | Disposition: A | Discharge status: Home / Self Care | Payer: Medicare Other | Source: Ambulatory Visit | Attending: Orthopedic Surgery | Admitting: Orthopedic Surgery

## 2017-09-03 ENCOUNTER — Encounter
Admission: RE | Disposition: A | Discharge status: Home / Self Care | Payer: Self-pay | Source: Ambulatory Visit | Attending: Orthopedic Surgery

## 2017-09-03 ENCOUNTER — Ambulatory Visit: Payer: Medicare Other | Admitting: Certified Registered Nurse Anesthetist

## 2017-09-03 DIAGNOSIS — S83231D Complex tear of medial meniscus, current injury, right knee, subsequent encounter: Secondary | ICD-10-CM | POA: Diagnosis not present

## 2017-09-03 DIAGNOSIS — M94261 Chondromalacia, right knee: Secondary | ICD-10-CM | POA: Diagnosis not present

## 2017-09-03 DIAGNOSIS — X58XXXD Exposure to other specified factors, subsequent encounter: Secondary | ICD-10-CM | POA: Diagnosis not present

## 2017-09-03 DIAGNOSIS — M65861 Other synovitis and tenosynovitis, right lower leg: Secondary | ICD-10-CM | POA: Insufficient documentation

## 2017-09-03 HISTORY — PX: KNEE ARTHROSCOPY WITH MEDIAL MENISECTOMY: SHX5651

## 2017-09-03 SURGERY — ARTHROSCOPY, KNEE, WITH MEDIAL MENISCECTOMY
Anesthesia: General | Laterality: Right

## 2017-09-03 MED ORDER — FAMOTIDINE 20 MG PO TABS
20.0000 mg | ORAL_TABLET | Freq: Once | ORAL | Status: AC
  Administered 2017-09-03: 20 mg via ORAL

## 2017-09-03 MED ORDER — ONDANSETRON HCL 4 MG/2ML IJ SOLN
INTRAMUSCULAR | Status: AC
  Filled 2017-09-03: qty 2

## 2017-09-03 MED ORDER — LIDOCAINE HCL (PF) 2 % IJ SOLN
INTRAMUSCULAR | Status: AC
  Filled 2017-09-03: qty 10

## 2017-09-03 MED ORDER — PROPOFOL 10 MG/ML IV BOLUS
INTRAVENOUS | Status: AC
  Filled 2017-09-03: qty 20

## 2017-09-03 MED ORDER — MIDAZOLAM HCL 2 MG/2ML IJ SOLN
INTRAMUSCULAR | Status: DC | PRN
  Administered 2017-09-03: 2 mg via INTRAVENOUS

## 2017-09-03 MED ORDER — METOCLOPRAMIDE HCL 10 MG PO TABS: 5.0000 mg | ORAL_TABLET | Freq: Three times a day (TID) | ORAL | Status: DC | PRN

## 2017-09-03 MED ORDER — HYDROCODONE-ACETAMINOPHEN 5-325 MG PO TABS: 1.0000 | ORAL_TABLET | ORAL | 0 refills | Status: DC | PRN

## 2017-09-03 MED ORDER — FENTANYL CITRATE (PF) 100 MCG/2ML IJ SOLN: 25.0000 ug | INTRAMUSCULAR | Status: DC | PRN

## 2017-09-03 MED ORDER — ONDANSETRON HCL 4 MG PO TABS: 4.0000 mg | ORAL_TABLET | Freq: Four times a day (QID) | ORAL | Status: DC | PRN

## 2017-09-03 MED ORDER — FAMOTIDINE 20 MG PO TABS
ORAL_TABLET | ORAL | Status: AC
  Filled 2017-09-03: qty 1

## 2017-09-03 MED ORDER — ONDANSETRON HCL 4 MG/2ML IJ SOLN
INTRAMUSCULAR | Status: DC | PRN
  Administered 2017-09-03: 4 mg via INTRAVENOUS

## 2017-09-03 MED ORDER — PROPOFOL 10 MG/ML IV BOLUS
INTRAVENOUS | Status: DC | PRN
  Administered 2017-09-03: 200 mg via INTRAVENOUS

## 2017-09-03 MED ORDER — LACTATED RINGERS IV SOLN
INTRAVENOUS | Status: DC
  Administered 2017-09-03: 15:00:00 via INTRAVENOUS

## 2017-09-03 MED ORDER — SODIUM CHLORIDE 0.9 % IV SOLN: INTRAVENOUS | Status: DC

## 2017-09-03 MED ORDER — DEXAMETHASONE SODIUM PHOSPHATE 10 MG/ML IJ SOLN
INTRAMUSCULAR | Status: DC | PRN
  Administered 2017-09-03: 10 mg via INTRAVENOUS

## 2017-09-03 MED ORDER — HYDROCODONE-ACETAMINOPHEN 5-325 MG PO TABS: 1.0000 | ORAL_TABLET | ORAL | Status: DC | PRN

## 2017-09-03 MED ORDER — BUPIVACAINE-EPINEPHRINE (PF) 0.5% -1:200000 IJ SOLN
INTRAMUSCULAR | Status: DC | PRN
  Administered 2017-09-03: 20 mL

## 2017-09-03 MED ORDER — BUPIVACAINE-EPINEPHRINE (PF) 0.5% -1:200000 IJ SOLN
INTRAMUSCULAR | Status: AC
  Filled 2017-09-03: qty 30

## 2017-09-03 MED ORDER — MIDAZOLAM HCL 2 MG/2ML IJ SOLN
INTRAMUSCULAR | Status: AC
  Filled 2017-09-03: qty 2

## 2017-09-03 MED ORDER — SUCCINYLCHOLINE CHLORIDE 20 MG/ML IJ SOLN
INTRAMUSCULAR | Status: DC | PRN
  Administered 2017-09-03: 30 mg via INTRAVENOUS

## 2017-09-03 MED ORDER — FENTANYL CITRATE (PF) 100 MCG/2ML IJ SOLN
INTRAMUSCULAR | Status: AC
  Filled 2017-09-03: qty 2

## 2017-09-03 MED ORDER — METOCLOPRAMIDE HCL 5 MG/ML IJ SOLN: 5.0000 mg | Freq: Three times a day (TID) | INTRAMUSCULAR | Status: DC | PRN

## 2017-09-03 MED ORDER — ONDANSETRON HCL 4 MG/2ML IJ SOLN: 4.0000 mg | Freq: Four times a day (QID) | INTRAMUSCULAR | Status: DC | PRN

## 2017-09-03 MED ORDER — ONDANSETRON HCL 4 MG/2ML IJ SOLN: 4.0000 mg | Freq: Once | INTRAMUSCULAR | Status: DC | PRN

## 2017-09-03 MED ORDER — DEXAMETHASONE SODIUM PHOSPHATE 10 MG/ML IJ SOLN
INTRAMUSCULAR | Status: AC
  Filled 2017-09-03: qty 1

## 2017-09-03 SURGICAL SUPPLY — 24 items
BANDAGE ACE 4X5 VEL STRL LF (GAUZE/BANDAGES/DRESSINGS) IMPLANT
BLADE INCISOR PLUS 4.5 (BLADE) IMPLANT
CHLORAPREP W/TINT 26ML (MISCELLANEOUS) ×3 IMPLANT
CUFF TOURN 24 STER (MISCELLANEOUS) IMPLANT
CUFF TOURN 30 STER DUAL PORT (MISCELLANEOUS) ×2 IMPLANT
GAUZE SPONGE 4X4 12PLY STRL (GAUZE/BANDAGES/DRESSINGS) ×3 IMPLANT
GLOVE SURG SYN 9.0  PF PI (GLOVE) ×2
GLOVE SURG SYN 9.0 PF PI (GLOVE) ×1 IMPLANT
GOWN SRG 2XL LVL 4 RGLN SLV (GOWNS) ×1 IMPLANT
GOWN STRL NON-REIN 2XL LVL4 (GOWNS) ×3
GOWN STRL REUS W/ TWL LRG LVL3 (GOWN DISPOSABLE) ×2 IMPLANT
GOWN STRL REUS W/TWL LRG LVL3 (GOWN DISPOSABLE) ×6
IV LACTATED RINGER IRRG 3000ML (IV SOLUTION) ×6
IV LR IRRIG 3000ML ARTHROMATIC (IV SOLUTION) ×2 IMPLANT
KIT RM TURNOVER STRD PROC AR (KITS) ×3 IMPLANT
MANIFOLD NEPTUNE II (INSTRUMENTS) ×3 IMPLANT
PACK ARTHROSCOPY KNEE (MISCELLANEOUS) ×3 IMPLANT
SET TUBE SUCT SHAVER OUTFL 24K (TUBING) ×3 IMPLANT
SET TUBE TIP INTRA-ARTICULAR (MISCELLANEOUS) ×3 IMPLANT
SUT ETHILON 4-0 (SUTURE) ×3
SUT ETHILON 4-0 FS2 18XMFL BLK (SUTURE) ×1
SUTURE ETHLN 4-0 FS2 18XMF BLK (SUTURE) ×1 IMPLANT
TUBING ARTHRO INFLOW-ONLY STRL (TUBING) ×3 IMPLANT
WAND COBLATION FLOW 50 (SURGICAL WAND) ×3 IMPLANT

## 2017-09-03 NOTE — Discharge Instructions (Addendum)
Weightbearing as tolerated on right leg. Leave bandage in place until recheck. Try to minimize activity through the weekend. Aspirin 325 mg daily. Keep dressing clean and dry  AMBULATORY SURGERY  DISCHARGE INSTRUCTIONS   1) The drugs that you were given will stay in your system until tomorrow so for the next 24 hours you should not:  A) Drive an automobile B) Make any legal decisions C) Drink any alcoholic beverage   2) You may resume regular meals tomorrow.  Today it is better to start with liquids and gradually work up to solid foods.  You may eat anything you prefer, but it is better to start with liquids, then soup and crackers, and gradually work up to solid foods.   3) Please notify your doctor immediately if you have any unusual bleeding, trouble breathing, redness and pain at the surgery site, drainage, fever, or pain not relieved by medication.    4) Additional Instructions:        Please contact your physician with any problems or Same Day Surgery at [phone], Monday through Friday 6 am to 4 pm, or [place] at [facility] number at [phone].

## 2017-09-03 NOTE — H&P (Signed)
Reviewed paper H+P, will be scanned into chart. No changes noted.  

## 2017-09-03 NOTE — Anesthesia Procedure Notes (Signed)
Procedure Name: LMA Insertion Date/Time: 09/03/2017 6:06 PM Performed by: Jonna Clark, CRNA Pre-anesthesia Checklist: Patient identified, Patient being monitored, Timeout performed, Emergency Drugs available and Suction available Patient Re-evaluated:Patient Re-evaluated prior to induction Oxygen Delivery Method: Circle system utilized Preoxygenation: Pre-oxygenation with 100% oxygen Induction Type: IV induction Ventilation: Mask ventilation without difficulty LMA: LMA inserted LMA Size: 3.5 Tube type: Oral Number of attempts: 1 Placement Confirmation: positive ETCO2 and breath sounds checked- equal and bilateral Tube secured with: Tape Dental Injury: Teeth and Oropharynx as per pre-operative assessment

## 2017-09-03 NOTE — Op Note (Signed)
09/03/2017  6:51 PM  PATIENT:  Cindy Mcclure  52 y.o. female  PRE-OPERATIVE DIAGNOSIS:  SYNOVITIS OF RIGHT KNEE, medial meniscus tear  POST-OPERATIVE DIAGNOSIS:  SYNOVITIS OF RIGHT KNEE, medial meniscus tear  PROCEDURE:  Procedure(s): KNEE ARTHROSCOPY WITH MEDIAL MENISECTOMY, PARTIAL SYNOVECTOMY (Right)  SURGEON: Laurene Footman, MD  ASSISTANTS: None  ANESTHESIA:   general  EBL:  No intake/output data recorded.  BLOOD ADMINISTERED:none  DRAINS: none   LOCAL MEDICATIONS USED:  MARCAINE     SPECIMEN:  No Specimen  DISPOSITION OF SPECIMEN:  N/A  COUNTS:  YES  TOURNIQUET:  * Missing tourniquet times found for documented tourniquets in log: [id number] *  IMPLANTS: None  DICTATION: .Dragon Dictation patient brought the operating room and after adequate anesthesia was obtained the right leg was prepped and draped in sterile fashion with a tourniquet applied and arthroscopic leg holder placed. After patient identification and timeout procedures were completed an inferior lateral portal was made and the scope introduced. Initial inspection revealed significant synovitis in the suprapatellar pouch. The patellofemoral joint however had very minimal chondromalacia with normal tracking. Coming around to the medial aspect of the knee and inferior medial portal was made and on probing there was a tear of the posterior horn of the meniscus consistent with MRI findings at the junction of the middle and posterior thirds involving the inner third of the meniscus this was socially ablated with use of an ArthroCare wand. The articular cartilage in the medial compartment was essentially normal. Going to the notch the anterior cruciate ligament was intact but there was significant anterior skin compartment synovitis and this was ablated as well around the notch as well as anterior medial compartment and lateral compartment the lateral compartment was examined and the meniscus and articular cartilage was  essentially normal the gutters were free of any loose bodies at this point the meniscus had been addressed and the suprapatellar synovitis was addressed with the ArthroCare wand and then the knee was irrigated until clear argentation was withdrawn the wounds were infiltrated with 10 cc half percent Sensorcaine with epinephrine into each portal followed by closure the wounds with 4-0 nylon in a simple interrupted manner Xeroform 4 x 4 web roll and Ace wrap applied  PLAN OF CARE: Discharge to home after PACU  PATIENT DISPOSITION:  PACU - hemodynamically stable.

## 2017-09-03 NOTE — Anesthesia Post-op Follow-up Note (Signed)
Anesthesia QCDR form completed.        

## 2017-09-03 NOTE — Transfer of Care (Signed)
Immediate Anesthesia Transfer of Care Note  Patient: Cindy Mcclure  Procedure(s) Performed: KNEE ARTHROSCOPY WITH MEDIAL MENISECTOMY, PARTIAL SYNOVECTOMY (Right )  Patient Location: PACU  Anesthesia Type:General  Level of Consciousness: drowsy and patient cooperative  Airway & Oxygen Therapy: Patient Spontanous Breathing and Patient connected to face mask oxygen  Post-op Assessment: Report given to RN and Post -op Vital signs reviewed and stable  Post vital signs: Reviewed and stable  Last Vitals:  Vitals:   09/03/17 1512 09/03/17 1853  BP: 117/70 114/82  Pulse: 79 88  Resp: 16 14  Temp: (!) 36.4 C   SpO2: 100% 99%    Last Pain:  Vitals:   09/03/17 1512  TempSrc: Oral  PainSc: 6          Complications: No apparent anesthesia complications

## 2017-09-03 NOTE — Anesthesia Preprocedure Evaluation (Signed)
Anesthesia Evaluation  Patient identified by MRN, date of birth, ID band Patient awake    Reviewed: Allergy & Precautions, H&P , NPO status , Patient's Chart, lab work & pertinent test results, reviewed documented beta blocker date and time   Airway Mallampati: II  TM Distance: >3 FB Neck ROM: full    Dental  (+) Teeth Intact   Pulmonary neg pulmonary ROS, former smoker,    Pulmonary exam normal        Cardiovascular Exercise Tolerance: Good negative cardio ROS Normal cardiovascular exam Rate:Normal     Neuro/Psych Seizures -, Well Controlled,  PSYCHIATRIC DISORDERS  Neuromuscular disease negative neurological ROS  negative psych ROS   GI/Hepatic negative GI ROS, Neg liver ROS,   Endo/Other  negative endocrine ROS  Renal/GU negative Renal ROS  negative genitourinary   Musculoskeletal   Abdominal   Peds  Hematology negative hematology ROS (+)   Anesthesia Other Findings   Reproductive/Obstetrics negative OB ROS                             Anesthesia Physical Anesthesia Plan  ASA: II  Anesthesia Plan: General LMA   Post-op Pain Management:    Induction:   PONV Risk Score and Plan: 4 or greater  Airway Management Planned:   Additional Equipment:   Intra-op Plan:   Post-operative Plan:   Informed Consent: I have reviewed the patients History and Physical, chart, labs and discussed the procedure including the risks, benefits and alternatives for the proposed anesthesia with the patient or authorized representative who has indicated his/her understanding and acceptance.     Plan Discussed with: CRNA  Anesthesia Plan Comments:         Anesthesia Quick Evaluation

## 2017-09-04 ENCOUNTER — Encounter: Payer: Self-pay | Admitting: Orthopedic Surgery

## 2017-09-14 NOTE — Anesthesia Postprocedure Evaluation (Signed)
Anesthesia Post Note  Patient: Cindy Mcclure  Procedure(s) Performed: KNEE ARTHROSCOPY WITH MEDIAL MENISECTOMY, PARTIAL SYNOVECTOMY (Right )  Patient location during evaluation: PACU Anesthesia Type: General Level of consciousness: awake and alert Pain management: pain level controlled Vital Signs Assessment: post-procedure vital signs reviewed and stable Respiratory status: spontaneous breathing, nonlabored ventilation, respiratory function stable and patient connected to nasal cannula oxygen Cardiovascular status: blood pressure returned to baseline and stable Postop Assessment: no apparent nausea or vomiting Anesthetic complications: no     Last Vitals:  Vitals:   09/03/17 1933 09/03/17 2000  BP: 116/87 115/82  Pulse: 87 85  Resp: 15 18  Temp: 36.5 C   SpO2: 97% 97%    Last Pain:  Vitals:   09/04/17 1348  TempSrc:   PainSc: 2                  Molli Barrows

## 2017-10-13 ENCOUNTER — Other Ambulatory Visit: Payer: Self-pay | Admitting: Family Medicine

## 2017-10-13 DIAGNOSIS — Z1231 Encounter for screening mammogram for malignant neoplasm of breast: Secondary | ICD-10-CM

## 2017-10-27 ENCOUNTER — Ambulatory Visit
Admission: RE | Admit: 2017-10-27 | Discharge: 2017-10-27 | Disposition: A | Discharge status: Home / Self Care | Payer: Medicare Other | Source: Ambulatory Visit | Attending: Family Medicine | Admitting: Family Medicine

## 2017-10-27 DIAGNOSIS — Z1231 Encounter for screening mammogram for malignant neoplasm of breast: Secondary | ICD-10-CM | POA: Diagnosis not present

## 2017-12-09 ENCOUNTER — Other Ambulatory Visit: Payer: Self-pay | Admitting: Orthopedic Surgery

## 2017-12-09 DIAGNOSIS — M19012 Primary osteoarthritis, left shoulder: Secondary | ICD-10-CM

## 2017-12-09 DIAGNOSIS — M7582 Other shoulder lesions, left shoulder: Secondary | ICD-10-CM

## 2017-12-21 ENCOUNTER — Ambulatory Visit
Admission: RE | Admit: 2017-12-21 | Discharge: 2017-12-21 | Disposition: A | Discharge status: Home / Self Care | Payer: Medicare Other | Source: Ambulatory Visit | Attending: Orthopedic Surgery | Admitting: Orthopedic Surgery

## 2017-12-21 DIAGNOSIS — S46022A Laceration of muscle(s) and tendon(s) of the rotator cuff of left shoulder, initial encounter: Secondary | ICD-10-CM | POA: Diagnosis not present

## 2017-12-21 DIAGNOSIS — M19012 Primary osteoarthritis, left shoulder: Secondary | ICD-10-CM | POA: Diagnosis present

## 2017-12-21 DIAGNOSIS — M7582 Other shoulder lesions, left shoulder: Secondary | ICD-10-CM | POA: Diagnosis not present

## 2017-12-21 DIAGNOSIS — M7552 Bursitis of left shoulder: Secondary | ICD-10-CM | POA: Diagnosis not present

## 2017-12-21 DIAGNOSIS — X58XXXA Exposure to other specified factors, initial encounter: Secondary | ICD-10-CM | POA: Diagnosis not present

## 2018-01-05 DIAGNOSIS — M79602 Pain in left arm: Secondary | ICD-10-CM | POA: Insufficient documentation

## 2018-01-05 DIAGNOSIS — M542 Cervicalgia: Secondary | ICD-10-CM | POA: Insufficient documentation

## 2018-03-08 DIAGNOSIS — M7582 Other shoulder lesions, left shoulder: Secondary | ICD-10-CM | POA: Insufficient documentation

## 2018-03-08 DIAGNOSIS — S46012A Strain of muscle(s) and tendon(s) of the rotator cuff of left shoulder, initial encounter: Secondary | ICD-10-CM | POA: Insufficient documentation

## 2018-06-16 ENCOUNTER — Other Ambulatory Visit: Payer: Self-pay

## 2018-06-16 ENCOUNTER — Encounter: Payer: Self-pay | Admitting: Emergency Medicine

## 2018-06-16 ENCOUNTER — Emergency Department: Payer: 59

## 2018-06-16 ENCOUNTER — Emergency Department
Admission: EM | Admit: 2018-06-16 | Discharge: 2018-06-16 | Disposition: A | Discharge status: Home / Self Care | Payer: 59 | Attending: Emergency Medicine | Admitting: Emergency Medicine

## 2018-06-16 DIAGNOSIS — S0990XA Unspecified injury of head, initial encounter: Secondary | ICD-10-CM | POA: Diagnosis not present

## 2018-06-16 DIAGNOSIS — R51 Headache: Secondary | ICD-10-CM | POA: Diagnosis present

## 2018-06-16 DIAGNOSIS — Z87891 Personal history of nicotine dependence: Secondary | ICD-10-CM | POA: Diagnosis not present

## 2018-06-16 DIAGNOSIS — X58XXXA Exposure to other specified factors, initial encounter: Secondary | ICD-10-CM | POA: Diagnosis not present

## 2018-06-16 DIAGNOSIS — Y9389 Activity, other specified: Secondary | ICD-10-CM | POA: Diagnosis not present

## 2018-06-16 DIAGNOSIS — Z79899 Other long term (current) drug therapy: Secondary | ICD-10-CM | POA: Diagnosis not present

## 2018-06-16 DIAGNOSIS — Y999 Unspecified external cause status: Secondary | ICD-10-CM | POA: Insufficient documentation

## 2018-06-16 DIAGNOSIS — Y929 Unspecified place or not applicable: Secondary | ICD-10-CM | POA: Insufficient documentation

## 2018-06-16 NOTE — Discharge Instructions (Addendum)
As we discussed your CT scan is negative for any acute abnormality.  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  As we discussed please follow-up with your neurologist by calling tomorrow to arrange a follow-up appointment to decide if further imaging such as an MRI is warranted.  Return to the emergency department for any acute symptoms personally concerning to yourself.

## 2018-06-16 NOTE — ED Triage Notes (Signed)
Patient reports that on 10/20, she lost her balance and fell down 7 flights of stairs, hitting her head on the concrete. Patient denies LOC. States she was not evaluated after falling because her mother was not willing to give her a ride and said she was ok. Patient seen at [facility] today and sent for further evaluation. Physician there concerned about unequal pupils. Pupils noted to be equal and reactive in triage. Patient reports increased lethargy and mild headaches since fall.

## 2018-06-16 NOTE — ED Provider Notes (Signed)
[facility] Emergency Department Provider Note  Time seen: 3:51 PM  I have reviewed the triage vital signs and the nursing notes.   HISTORY  Chief Complaint Head Injury and Fall    HPI DIYA Cindy Mcclure is a 52 y.o. female with a past medical history of epilepsy, presents to the emergency department for headache, fatigue ever since hitting her head 06/06/2018.  Patient saw her doctor today who referred the patient to the emergency department for further work-up as she noted the patient to have unequal pupils.  Here the patient is awake alert oriented, has no specific complaints but states generalized fatigue, states she has had some headaches as well as photophobia which she states started after hitting her head 06/06/2018.  Denies any weakness or numbness confusion or slurred speech.  Patient has been taking her medications.  Here the patient appears very well, no complaints at this time.  Normal pupils.   Past Medical History:  Diagnosis Date  . Allergic rhinitis   . Anxiety   . Arthritis    all over...knees, ankles, back  . Back pain   . Cervical radiculitis    no surgery, just cortisone shots.    . Constipation   . Constipation   . DDD (degenerative disc disease), cervical   . Dermatophytosis of nail 2019   both feet  . Osteoarthritis   . Plantar fasciitis    bilaterally  . Seizures ([place]) 1972   unsure of when she had last seizure due to petit mal type. does not know what brings on a seizure.  . Stress bladder incontinence, female   . Urinary incontinence     Patient Active Problem List   Diagnosis Date Noted  . Mixed incontinence 12/12/2015  . Cholecystitis with cholelithiasis 12/06/2015  . Cholecystitis   . Cervical radiculitis 04/20/2015  . Degeneration of intervertebral disc of cervical region 04/20/2015  . Encounter for therapeutic drug level monitoring 01/18/2015  . Arthritis of knee, degenerative 12/29/2013  . Partial epilepsy with  impairment of consciousness ([place]) 12/09/2011    Past Surgical History:  Procedure Laterality Date  . CESAREAN SECTION  [id number]  . CHOLECYSTECTOMY N/A 12/06/2015   Procedure: LAPAROSCOPIC CHOLECYSTECTOMY WITH INTRAOPERATIVE CHOLANGIOGRAM;  Surgeon: Dia Crawford III, MD;  Location: [facility] ORS;  Service: General;  Laterality: N/A;  . COLONOSCOPY WITH PROPOFOL N/A 09/29/2016   Procedure: COLONOSCOPY WITH PROPOFOL;  Surgeon: Manya Silvas, MD;  Location: [facility] ENDOSCOPY;  Service: Endoscopy;  Laterality: N/A;  . KNEE ARTHROSCOPY WITH EXCISION PLICA Left 1/61/0960   Procedure: KNEE ARTHROSCOPY WITH EXCISION PLICA, PARTIAL SYNOVECTOMY;  Surgeon: Hessie Knows, MD;  Location: [facility] ORS;  Service: Orthopedics;  Laterality: Left;  . KNEE ARTHROSCOPY WITH MEDIAL MENISECTOMY Right 09/03/2017   Procedure: KNEE ARTHROSCOPY WITH MEDIAL MENISECTOMY, PARTIAL SYNOVECTOMY;  Surgeon: Hessie Knows, MD;  Location: [facility] ORS;  Service: Orthopedics;  Laterality: Right;  . SURGERY FOR SEIZURES     nothing implanted in head. she was 52 years old. done at [facility]  . TUBAL LIGATION  2003    Prior to Admission medications   Medication Sig Start Date End Date Taking? Authorizing Provider  BIOTIN PO Take 1 capsule by mouth every morning. 500 mg    [provider]  Calcium Carb-Cholecalciferol (CALCIUM 600 + D PO) Take 1 tablet by mouth daily.    [provider]  cloBAZam (ONFI) 20 MG tablet Take 20 mg by mouth at bedtime.     [provider]  cyclobenzaprine (  FLEXERIL) 10 MG tablet Take 10 mg by mouth at bedtime.     [provider]  diclofenac (VOLTAREN) 75 MG EC tablet Take 75 mg by mouth 2 (two) times daily.    [provider]  Flaxseed, Linseed, (FLAXSEED OIL) 1000 MG CAPS Take 1,000 mg by mouth daily.    [provider]  HYDROcodone-acetaminophen (NORCO) 5-325 MG tablet Take 1 tablet by mouth every 4 (four) hours as needed for moderate pain. Patient not taking: Reported  on 08/21/2017 11/27/16   Hessie Knows, MD  HYDROcodone-acetaminophen [facility]) 5-325 MG tablet Take 1 tablet by mouth every 4 (four) hours as needed. 09/03/17   Hessie Knows, MD  ibuprofen (ADVIL,MOTRIN) 200 MG tablet Take 400 mg by mouth every 8 (eight) hours as needed for mild pain.    [provider]  lamoTRIgine (LAMICTAL) 150 MG tablet Take 150 mg by mouth 2 (two) times daily.     [provider]  Levetiracetam (KEPPRA XR) 750 MG TB24 Take 750 mg by mouth 2 (two) times daily.    [provider]  Multiple Vitamins-Minerals (CENTRUM ADULTS PO) Take 1 tablet by mouth daily.     [provider]  pyridOXINE (VITAMIN B-6) 100 MG tablet Take 100 mg by mouth daily.    [provider]  senna-docusate (SENOKOT-S) 8.6-50 MG tablet Take 4 tablets by mouth daily as needed for mild constipation.    [provider]  tiZANidine (ZANAFLEX) 4 MG tablet Take 4 mg by mouth 2 (two) times daily. As needed    [provider]  traMADol (ULTRAM) 50 MG tablet Take 50 mg by mouth every 12 (twelve) hours as needed for moderate pain (pain).  10/12/15   [provider]  VITAMIN A PO Take 8,000 Units by mouth daily.     [provider]  vitamin B-12 (CYANOCOBALAMIN) 500 MCG tablet Take 500 mcg by mouth daily.    [provider]  vitamin C (ASCORBIC ACID) 500 MG tablet Take 500 mg by mouth daily.    [provider]  VITAMIN E PO Take 1 tablet by mouth daily.    [provider]  zonisamide (ZONEGRAN) 100 MG capsule Take 400 mg by mouth daily with supper.    [provider]    No Known Allergies  No family history on file.  Social History Social History   Tobacco Use  . Smoking status: Former Smoker    Packs/day: 0.25    Last attempt to quit: 08/17/2012    Years since quitting: 5.8  . Smokeless tobacco: Never Used  . Tobacco comment: smokers in home  Substance Use Topics  . Alcohol use: Yes     Alcohol/week: 1.0 standard drinks    Types: 1 Cans of beer per week    Comment: occasionally  . Drug use: No    Comment: denies    Review of Systems Constitutional: Negative for fever.  Generalized fatigue. Eyes: No visual complaints.  Reported unequal pupils at PCP office. Cardiovascular: Negative for chest pain. Respiratory: Negative for shortness of breath. Gastrointestinal: Negative for abdominal pain, vomiting and diarrhea. Genitourinary: Negative for urinary compaints Musculoskeletal: Negative for musculoskeletal complaints Skin: Negative for skin complaints  Neurological: Intermittent moderate headache x10 days. All other ROS negative  ____________________________________________   PHYSICAL EXAM:  VITAL SIGNS: ED Triage Vitals  Enc Vitals Group     BP 06/16/18 1305 138/74     Pulse Rate 06/16/18 1305 74     Resp 06/16/18  1305 18     Temp 06/16/18 1305 98 F (36.7 C)     Temp Source 06/16/18 1305 Oral     SpO2 06/16/18 1305 98 %     Weight 06/16/18 1306 149 lb (67.6 kg)     Height 06/16/18 1306 4\' 11"  (1.499 m)     Head Circumference --      Peak Flow --      Pain Score 06/16/18 1306 2     Pain Loc --      Pain Edu? --      Excl. in [place]? --    Constitutional: Alert and oriented. Well appearing and in no distress. Eyes: Normal exam, Perrl around 3 to 4 mm. ENT   Head: Normocephalic and atraumatic.   Mouth/Throat: Mucous membranes are moist. Cardiovascular: Normal rate, regular rhythm. No murmur Respiratory: Normal respiratory effort without tachypnea nor retractions. Breath sounds are clear Gastrointestinal: Soft and nontender. No distention.   Musculoskeletal: Nontender with normal range of motion in all extremities.  Neurologic:  Normal speech and language. No gross focal neurologic deficits.  Equal grip strength.  Cranial nerves intact. Skin:  Skin is warm, dry and intact.  Psychiatric: Mood and affect are normal.    ____________________________________________    RADIOLOGY  CT shows no acute abnormality although there is a small hypoattenuation possibly an old stroke in the frontal lobe.  ____________________________________________   INITIAL IMPRESSION / ASSESSMENT AND PLAN / ED COURSE  Pertinent labs & imaging results that were available during my care of the patient were reviewed by me and considered in my medical decision making (see chart for details).  Patient presents emergency department for unequal pupils at her PCP office after hitting her head 06/06/2018.  Here the patient appears well has no specific complaints however on review of systems questioning patient does admit to a headache intermittently over the past 10 days, generalized fatigue as well as mild photophobia.  On exam today the patient has a normal neurological exam equal grip strength, no cranial nerve deficits.  Overall the patient appears very well.  CT scan of the head is negative for acute abnormality although does show slight hypoattenuation in the frontal lobe possibly an old stroke.  Patient denies any known old strokes.  Patient has a neurologist that she follows up with, Dr. Lisabeth Devoid at [facility] and will call the office tomorrow to arrange a follow-up appointment.  I did discuss with the patient she should discuss with her neurologist whether further imaging such as an MRI is warranted.  ____________________________________________   FINAL CLINICAL IMPRESSION(S) / ED DIAGNOSES  Head injury Headache    Harvest Dark, MD 06/16/18 1555

## 2018-06-16 NOTE — ED Notes (Signed)
AAOx3.  Skin warm and dry.  NAD 

## 2018-11-04 ENCOUNTER — Ambulatory Visit (INDEPENDENT_AMBULATORY_CARE_PROVIDER_SITE_OTHER): Payer: 59 | Admitting: Psychiatry

## 2018-11-04 ENCOUNTER — Encounter: Payer: Self-pay | Admitting: Psychiatry

## 2018-11-04 ENCOUNTER — Other Ambulatory Visit: Payer: Self-pay | Admitting: Psychiatry

## 2018-11-04 ENCOUNTER — Other Ambulatory Visit: Payer: Self-pay

## 2018-11-04 VITALS — BP 102/72 | HR 82 | Temp 99.0°F | Ht 59.75 in | Wt 154.0 lb

## 2018-11-04 DIAGNOSIS — F419 Anxiety disorder, unspecified: Secondary | ICD-10-CM

## 2018-11-04 DIAGNOSIS — F320 Major depressive disorder, single episode, mild: Secondary | ICD-10-CM

## 2018-11-04 MED ORDER — SERTRALINE HCL 25 MG PO TABS: 25.0000 mg | ORAL_TABLET | Freq: Every day | ORAL | 1 refills | Status: DC

## 2018-11-04 NOTE — Patient Instructions (Signed)

## 2018-11-04 NOTE — Progress Notes (Signed)
Psychiatric Initial Adult Assessment   Patient Identification: Cindy Mcclure MRN:  [id number] Date of Evaluation:  11/04/2018 Referral Source: Denton Lank MD Chief Complaint:  ' I am here to establish care." Chief Complaint    Establish Care     Visit Diagnosis:    ICD-10-CM   1. MDD (major depressive disorder), single episode, mild (HCC) F32.0 sertraline (ZOLOFT) 25 MG tablet  2. Anxiety disorder, unspecified type F41.9 sertraline (ZOLOFT) 25 MG tablet    History of Present Illness:  Cindy Mcclure is a 53 yr old Caucasian female, on disability, lives in [place], has a history of seizure disorder, urinary incontinence,  degenerative disc disease, presented to clinic today to establish care.  Patient reports she has been struggling with depressive symptoms since the past several months.  She describes sadness, crying spells, irritability, hopelessness, lack of motivation, anhedonia, fatigue on a regular basis.  This has been getting worse since the past few weeks.  Patient also reports feeling on edge, anxious, irritable, feeling nervous and worrying too much on and off.  This mostly happens when she is home alone.  This has been getting worse since the past few weeks.  Patient reports she has several psychosocial stressors.  She has severe seizure disorder and is on multiple medications for the same.  Patient reports having relationship struggles with her daughter who is 84 year old and her mother who is 16 year old.  She reports she currently lives in [place] and her daughter who is 1 and her son who is 68 lives with her.  She reports she is home alone most of the time since her daughter is in school or with friends or with her mother who lives in [place].  She reports her son also travels with a band and is gone for 2 months each time.  She reports even when they are around her daughter and her mother does not want to take her with them when they go to church.  This makes her very  depressed.  Patient reports that her children's father was in and out of her life and is currently deceased.  Patient reports sleep is fair.  Patient denies any suicidality, homicidality or perceptual disturbances.  Patient denies any substance abuse problems.    Associated Signs/Symptoms: Depression Symptoms:  depressed mood, anhedonia, feelings of worthlessness/guilt, difficulty concentrating, anxiety, loss of energy/fatigue, (Hypo) Manic Symptoms:  denies Anxiety Symptoms:  Excessive Worry, Psychotic Symptoms:  denies PTSD Symptoms: Negative  Past Psychiatric History: Patient denies inpatient mental health admissions.  Patient denies past history of being treated for psychiatric problems.  Patient denies suicide attempts.  Previous Psychotropic Medications: No   Substance Abuse History in the last 12 months:  No.  Consequences of Substance Abuse: Negative  Past Medical History:  Past Medical History:  Diagnosis Date  . Allergic rhinitis   . Anxiety   . Arthritis    all over...knees, ankles, back  . Back pain   . Cervical radiculitis    no surgery, just cortisone shots.    . Constipation   . Constipation   . DDD (degenerative disc disease), cervical   . Dermatophytosis of nail 2019   both feet  . Osteoarthritis   . Plantar fasciitis    bilaterally  . Seizures ([place]) 1972   unsure of when she had last seizure due to petit mal type. does not know what brings on a seizure.  . Stress bladder incontinence, female   . Urinary incontinence     Past  Surgical History:  Procedure Laterality Date  . CESAREAN SECTION  [id number]  . CHOLECYSTECTOMY N/A 12/06/2015   Procedure: LAPAROSCOPIC CHOLECYSTECTOMY WITH INTRAOPERATIVE CHOLANGIOGRAM;  Surgeon: Dia Crawford III, MD;  Location: [facility] ORS;  Service: General;  Laterality: N/A;  . COLONOSCOPY WITH PROPOFOL N/A 09/29/2016   Procedure: COLONOSCOPY WITH PROPOFOL;  Surgeon: Manya Silvas, MD;  Location[facility] ENDOSCOPY;   Service: Endoscopy;  Laterality: N/A;  . KNEE ARTHROSCOPY WITH EXCISION PLICA Left 02/08/7627   Procedure: KNEE ARTHROSCOPY WITH EXCISION PLICA, PARTIAL SYNOVECTOMY;  Surgeon: Hessie Knows, MD;  Location: [facility] ORS;  Service: Orthopedics;  Laterality: Left;  . KNEE ARTHROSCOPY WITH MEDIAL MENISECTOMY Right 09/03/2017   Procedure: KNEE ARTHROSCOPY WITH MEDIAL MENISECTOMY, PARTIAL SYNOVECTOMY;  Surgeon: Hessie Knows, MD;  Location: [facility] ORS;  Service: Orthopedics;  Laterality: Right;  . SURGERY FOR SEIZURES     nothing implanted in head. she was 53 years old. done at [facility]  . TUBAL LIGATION  2003    Family Psychiatric History: Daughter-anxiety disorder and goes to [facility].  Family History:  Family History  Problem Relation Age of Onset  . Anxiety disorder Daughter     Social History:   Social History   Socioeconomic History  . Marital status: Divorced    Spouse name: Not on file  . Number of children: Not on file  . Years of education: Not on file  . Highest education level: Not on file  Occupational History  . Not on file  Social Needs  . Financial resource strain: Not on file  . Food insecurity:    Worry: Not on file    Inability: Not on file  . Transportation needs:    Medical: Not on file    Non-medical: Not on file  Tobacco Use  . Smoking status: Former Smoker    Packs/day: 0.25    Last attempt to quit: 08/17/2012    Years since quitting: 6.2  . Smokeless tobacco: Never Used  . Tobacco comment: smokers in home  Substance and Sexual Activity  . Alcohol use: Not Currently    Comment: only on special occasions   . Drug use: No    Comment: denies  . Sexual activity: Not Currently  Lifestyle  . Physical activity:    Days per week: Not on file    Minutes per session: Not on file  . Stress: Not on file  Relationships  . Social connections:    Talks on phone: Not on file    Gets together: Not on file    Attends religious service: Not on file    Active member of  club or organization: Not on file    Attends meetings of clubs or organizations: Not on file    Relationship status: Not on file  Other Topics Concern  . Not on file  Social History Narrative  . Not on file    Additional Social History: Patient is widowed.  She lives in [place].  Her daughter who is 40 years old and her 17 year old son lives with her.  She is on disability.  She graduated high school.  Her ex-husband was in and out of her life and she had a rocky relationship with him.  They were separated for a long time before he passed away.  She has a 77 year old daughter who is married.  Allergies:  No Known Allergies  Metabolic Disorder Labs: No results found for: HGBA1C, MPG No results found for: PROLACTIN No results found for: CHOL, TRIG, HDL, CHOLHDL,  VLDL, LDLCALC No results found for: TSH  Therapeutic Level Labs: No results found for: LITHIUM No results found for: CBMZ No results found for: VALPROATE  Current Medications: Current Outpatient Medications  Medication Sig Dispense Refill  . BIOTIN PO Take 1 capsule by mouth every morning. 500 mg    . Calcium Carb-Cholecalciferol (CALCIUM 600 + D PO) Take 1 tablet by mouth daily.    . cloBAZam (ONFI) 20 MG tablet Take 20 mg by mouth at bedtime.     . cyclobenzaprine (FLEXERIL) 5 MG tablet 1/2-1 po qHS prn    . diclofenac (VOLTAREN) 75 MG EC tablet Take 75 mg by mouth 2 (two) times daily.    . Flaxseed, Linseed, (FLAXSEED OIL) 1000 MG CAPS Take 1,000 mg by mouth daily.    Marland Kitchen ibuprofen (ADVIL,MOTRIN) 200 MG tablet Take 400 mg by mouth every 8 (eight) hours as needed for mild pain.    Marland Kitchen lamoTRIgine (LAMICTAL) 150 MG tablet Take 150 mg by mouth 2 (two) times daily.     . Levetiracetam (KEPPRA XR) 750 MG TB24 Take 750 mg by mouth 2 (two) times daily.    . Multiple Vitamins-Minerals (CENTRUM ADULTS PO) Take 1 tablet by mouth daily.     Marland Kitchen omeprazole (PRILOSEC) 20 MG capsule Take by mouth.    . traMADol (ULTRAM) 50 MG tablet  Take 50 mg by mouth every 12 (twelve) hours as needed for moderate pain (pain).     Marland Kitchen VITAMIN A PO Take 8,000 Units by mouth daily.     . vitamin C (ASCORBIC ACID) 500 MG tablet Take 500 mg by mouth daily.    Marland Kitchen VITAMIN E PO Take 1 tablet by mouth daily.    Marland Kitchen zonisamide (ZONEGRAN) 100 MG capsule Take 400 mg by mouth daily with supper.    Marland Kitchen HYDROcodone-acetaminophen (NORCO) 5-325 MG tablet Take 1 tablet by mouth every 4 (four) hours as needed for moderate pain. (Patient not taking: Reported on 08/21/2017) 30 tablet 0  . HYDROcodone-acetaminophen (NORCO) 5-325 MG tablet Take 1 tablet by mouth every 4 (four) hours as needed. (Patient not taking: Reported on 11/04/2018) 30 tablet 0  . pyridOXINE (VITAMIN B-6) 100 MG tablet Take by mouth.    . senna-docusate (SENOKOT-S) 8.6-50 MG tablet Take 4 tablets by mouth daily as needed for mild constipation.    . sertraline (ZOLOFT) 25 MG tablet Take 1 tablet (25 mg total) by mouth daily with breakfast. 30 tablet 1  . vitamin B-12 (CYANOCOBALAMIN) 1000 MCG tablet Take by mouth.     No current facility-administered medications for this visit.     Musculoskeletal: Strength & Muscle Tone: within normal limits Gait & Station: normal Patient leans: N/A  Psychiatric Specialty Exam: Review of Systems  Psychiatric/Behavioral: Positive for depression. The patient is nervous/anxious.   All other systems reviewed and are negative.   Blood pressure 102/72, pulse 82, temperature 99 F (37.2 C), height 4' 11.75" (1.518 m), weight 154 lb (69.9 kg).Body mass index is 30.33 kg/m.  General Appearance: Casual  Eye Contact:  Fair  Speech:  Clear and Coherent  Volume:  Normal  Mood:  Anxious and Depressed  Affect:  Tearful  Thought Process:  Goal Directed and Descriptions of Associations: Intact  Orientation:  Full (Time, Place, and Person)  Thought Content:  Logical  Suicidal Thoughts:  No  Homicidal Thoughts:  No  Memory:  Immediate;   Fair Recent;   Fair Remote;    Fair  Judgement:  Fair  Insight:  Fair  Psychomotor Activity:  Normal  Concentration:  Concentration: Fair and Attention Span: Fair  Recall:  [employer] of Knowledge:Fair  Language: Fair  Akathisia:  No  Handed:  Right  AIMS (if indicated): denies tremors, rigidity  Assets:  Communication Skills Desire for Improvement Social Support  ADL's:  Intact  Cognition: WNL  Sleep:  Fair   Screenings:   Assessment and Plan: Keshauna is a 53 year old Caucasian female, widowed, lives in [place], has a history of seizure disorder, degenerative disc disease, urinary incontinence, chronic pain, presented to clinic today to establish care.  Patient struggles with depression and anxiety.  She is biologically predisposed given her multiple medical problems including pain as well as family history of mental health problems.  Patient also has psychosocial stressors of living with chronic medical problems, relationship struggles.  Patient will benefit from medication management as well as psychotherapy sessions.  Plan as noted below.  Plan MDD-unstable Start Zoloft 25 mg p.o. daily with breakfast Refer for CBT.  She will start seeing therapist here in clinic. I have provided her medication education, discussed drug interaction with her antiseizure medications and SSRIs.  She will monitor herself closely. PHQ 9 equals 8-patient likely minimizing it.  Clinically observed as tearful, depressed and anxious.  Anxiety disorder-unstable Rule out GAD GAD 7 equals 5 Zoloft will help Refer for CBT  Will order labs-TSH.  She will go to [facility].  Will coordinate care with her neurologist-Dr. Jeoffrey Massed at [facility] at [phone].  Patient to sign a release to obtain medical records from her neurologist.  Follow-up in clinic in 2 weeks or sooner if needed.  I have spent atleast 60 minutes face to face with patient today. More than 50 % of the time was spent for psychoeducation and supportive psychotherapy  and care coordination.  This note was generated in part or whole with voice recognition software. Voice recognition is usually quite accurate but there are transcription errors that can and very often do occur. I apologize for any typographical errors that were not detected and corrected.          Ursula Alert, MD 3/19/20203:36 PM

## 2018-11-05 LAB — TSH: TSH: 1.93 u[IU]/mL (ref 0.450–4.500)

## 2018-11-18 ENCOUNTER — Ambulatory Visit (INDEPENDENT_AMBULATORY_CARE_PROVIDER_SITE_OTHER): Payer: 59 | Admitting: Psychiatry

## 2018-11-18 ENCOUNTER — Encounter: Payer: Self-pay | Admitting: Psychiatry

## 2018-11-18 ENCOUNTER — Encounter: Admission: RE | Payer: Self-pay | Source: Home / Self Care

## 2018-11-18 ENCOUNTER — Other Ambulatory Visit: Payer: Self-pay

## 2018-11-18 ENCOUNTER — Ambulatory Visit: Admission: RE | Admit: 2018-11-18 | Payer: 59 | Source: Home / Self Care | Admitting: Surgery

## 2018-11-18 DIAGNOSIS — F419 Anxiety disorder, unspecified: Secondary | ICD-10-CM | POA: Diagnosis not present

## 2018-11-18 DIAGNOSIS — F320 Major depressive disorder, single episode, mild: Secondary | ICD-10-CM | POA: Diagnosis not present

## 2018-11-18 SURGERY — SHOULDER ARTHROSCOPY WITH SUBACROMIAL DECOMPRESSION AND OPEN ROTATOR CUFF REPAIR, OPEN BICEPS TENDON REPAIR
Anesthesia: Choice | Laterality: Left

## 2018-11-18 NOTE — Progress Notes (Signed)
Virtual Visit via Telephone Note  I connected with [name] on 11/18/18 at 11:45 AM EDT by telephone and verified that I am speaking with the correct person using two identifiers.   I discussed the limitations, risks, security and privacy concerns of performing an evaluation and management service by telephone and the availability of in person appointments. I also discussed with the patient that there may be a patient responsible charge related to this service. The patient expressed understanding and agreed to proceed.   I discussed the assessment and treatment plan with the patient. The patient was provided an opportunity to ask questions and all were answered. The patient agreed with the plan and demonstrated an understanding of the instructions.   The patient was advised to call back or seek an in-person evaluation if the symptoms worsen or if the condition fails to improve as anticipated.  I provided 15 minutes of non-face-to-face time during this encounter.   Ursula Alert, MD  [facility] MD OP Progress Note  11/18/2018 12:15 PM BRIONY PARVEEN  MRN:  [id number]  Chief Complaint:I am here for follow up.' Chief Complaint    Follow-up     HPI: Cindy Mcclure is a 53 year old Caucasian female, on disability, lives in [place], has a history of depression, anxiety, seizure disorder, urinary incontinence, degenerative disc disease, was evaluated by phone today.  Patient today reports that she has been struggling with some relationship stressors.  She continues to worry about her relationship with her daughter.  She reports she feels left out.  She reports her daughter spends time with her mother and also helps her out a lot.  She reports whenever she ask her daughter to help her with something she just ignores her.  This has been affecting her emotionally.  She is afraid that her mother is trying to separate her daughter from her.  Patient today reports she is not depressed.  She denies any  suicidality.  She reports sleep is good.  Patient today reports she never picked up the medication-Zoloft which was sent out to the pharmacy during her last appointment on 11/04/2018.  Patient reports she is not interested in starting medications.  Some time was spent providing medication education as well as trying to understand what she wants from this appointment.  Patient reports she wants to just talk to someone and is not interested in medications.  Discussed with patient that she does have upcoming appointment with therapist Ms. Alden Hipp on April 6.  Discussed with her to continue to work with her therapist at this time and to return to clinic to see writer if she is  interested in medications.  Patient agreed with plan. Visit Diagnosis:    ICD-10-CM   1. MDD (major depressive disorder), single episode, mild ([place]) F32.0   2. Anxiety disorder, unspecified type F41.9     Past Psychiatric History: Reviewed past psychiatric history from my progress note on 11/04/2018.  Past Medical History:  Past Medical History:  Diagnosis Date  . Allergic rhinitis   . Anxiety   . Arthritis    all over...knees, ankles, back  . Back pain   . Cervical radiculitis    no surgery, just cortisone shots.    . Constipation   . Constipation   . DDD (degenerative disc disease), cervical   . Dermatophytosis of nail 2019   both feet  . Osteoarthritis   . Plantar fasciitis    bilaterally  . Seizures ([place]) 1972   unsure of when  she had last seizure due to petit mal type. does not know what brings on a seizure.  . Stress bladder incontinence, female   . Urinary incontinence     Past Surgical History:  Procedure Laterality Date  . CESAREAN SECTION  [id number]  . CHOLECYSTECTOMY N/A 12/06/2015   Procedure: LAPAROSCOPIC CHOLECYSTECTOMY WITH INTRAOPERATIVE CHOLANGIOGRAM;  Surgeon: Dia Crawford III, MD;  Location: [facility] ORS;  Service: General;  Laterality: N/A;  . COLONOSCOPY WITH PROPOFOL N/A 09/29/2016    Procedure: COLONOSCOPY WITH PROPOFOL;  Surgeon: Manya Silvas, MD;  Location[facility];  Service: Endoscopy;  Laterality: N/A;  . KNEE ARTHROSCOPY WITH EXCISION PLICA Left 02/11/9484   Procedure: KNEE ARTHROSCOPY WITH EXCISION PLICA, PARTIAL SYNOVECTOMY;  Surgeon: Hessie Knows, MD;  Location: [facility] ORS;  Service: Orthopedics;  Laterality: Left;  . KNEE ARTHROSCOPY WITH MEDIAL MENISECTOMY Right 09/03/2017   Procedure: KNEE ARTHROSCOPY WITH MEDIAL MENISECTOMY, PARTIAL SYNOVECTOMY;  Surgeon: Hessie Knows, MD;  Location: [facility] ORS;  Service: Orthopedics;  Laterality: Right;  . SURGERY FOR SEIZURES     nothing implanted in head. she was 53 years old. done at [facility]  . TUBAL LIGATION  2003    Family Psychiatric History: Reviewed family psychiatric history from my progress note on 11/04/2018.  Family History:  Family History  Problem Relation Age of Onset  . Anxiety disorder Daughter     Social History: Reviewed social history from my progress note on 11/04/2018. Social History   Socioeconomic History  . Marital status: Divorced    Spouse name: Not on file  . Number of children: Not on file  . Years of education: Not on file  . Highest education level: Not on file  Occupational History  . Not on file  Social Needs  . Financial resource strain: Not on file  . Food insecurity:    Worry: Not on file    Inability: Not on file  . Transportation needs:    Medical: Not on file    Non-medical: Not on file  Tobacco Use  . Smoking status: Former Smoker    Packs/day: 0.25    Last attempt to quit: 08/17/2012    Years since quitting: 6.2  . Smokeless tobacco: Never Used  . Tobacco comment: smokers in home  Substance and Sexual Activity  . Alcohol use: Not Currently    Comment: only on special occasions   . Drug use: No    Comment: denies  . Sexual activity: Not Currently  Lifestyle  . Physical activity:    Days per week: Not on file    Minutes per session: Not on file  . Stress:  Not on file  Relationships  . Social connections:    Talks on phone: Not on file    Gets together: Not on file    Attends religious service: Not on file    Active member of club or organization: Not on file    Attends meetings of clubs or organizations: Not on file    Relationship status: Not on file  Other Topics Concern  . Not on file  Social History Narrative  . Not on file    Allergies: No Known Allergies  Metabolic Disorder Labs: No results found for: HGBA1C, MPG No results found for: PROLACTIN No results found for: CHOL, TRIG, HDL, CHOLHDL, VLDL, LDLCALC Lab Results  Component Value Date   TSH 1.930 11/04/2018    Therapeutic Level Labs: No results found for: LITHIUM No results found for: VALPROATE No components found for:  CBMZ  Current Medications: Current Outpatient Medications  Medication Sig Dispense Refill  . BIOTIN PO Take 1 capsule by mouth every morning. 500 mg    . Calcium Carb-Cholecalciferol (CALCIUM 600 + D PO) Take 1 tablet by mouth daily.    . cloBAZam (ONFI) 20 MG tablet Take 20 mg by mouth at bedtime.     . cyclobenzaprine (FLEXERIL) 5 MG tablet 1/2-1 po qHS prn    . diclofenac (VOLTAREN) 75 MG EC tablet Take 75 mg by mouth 2 (two) times daily.    . Flaxseed, Linseed, (FLAXSEED OIL) 1000 MG CAPS Take 1,000 mg by mouth daily.    Marland Kitchen ibuprofen (ADVIL,MOTRIN) 200 MG tablet Take 400 mg by mouth every 8 (eight) hours as needed for mild pain.    Marland Kitchen lamoTRIgine (LAMICTAL) 150 MG tablet Take 150 mg by mouth 2 (two) times daily.     . Levetiracetam (KEPPRA XR) 750 MG TB24 Take 750 mg by mouth 2 (two) times daily.    . Multiple Vitamins-Minerals (CENTRUM ADULTS PO) Take 1 tablet by mouth daily.     Marland Kitchen omeprazole (PRILOSEC) 20 MG capsule Take by mouth.    . pyridOXINE (VITAMIN B-6) 100 MG tablet Take by mouth.    . senna-docusate (SENOKOT-S) 8.6-50 MG tablet Take 4 tablets by mouth daily as needed for mild constipation.    . sertraline (ZOLOFT) 25 MG tablet Take  1 tablet (25 mg total) by mouth daily with breakfast. 30 tablet 1  . traMADol (ULTRAM) 50 MG tablet Take 50 mg by mouth every 12 (twelve) hours as needed for moderate pain (pain).     Marland Kitchen VITAMIN A PO Take 8,000 Units by mouth daily.     . vitamin B-12 (CYANOCOBALAMIN) 1000 MCG tablet Take by mouth.    . vitamin C (ASCORBIC ACID) 500 MG tablet Take 500 mg by mouth daily.    Marland Kitchen VITAMIN E PO Take 1 tablet by mouth daily.    Marland Kitchen zonisamide (ZONEGRAN) 100 MG capsule Take 400 mg by mouth daily with supper.     No current facility-administered medications for this visit.      Musculoskeletal: Strength & Muscle Tone: UTA Gait & Station: UTA Patient leans: N/A  Psychiatric Specialty Exam: Review of Systems  Psychiatric/Behavioral: The patient is nervous/anxious.   All other systems reviewed and are negative.   There were no vitals taken for this visit.There is no height or weight on file to calculate BMI.  General Appearance: UTA  Eye Contact:  UTA  Speech:  Normal Rate  Volume:  Decreased  Mood:  Anxious  Affect:  UTA  Thought Process:  Goal Directed and Descriptions of Associations: Intact  Orientation:  Full (Time, Place, and Person)  Thought Content: Logical   Suicidal Thoughts:  No  Homicidal Thoughts:  No  Memory:  Immediate;   Fair Recent;   Fair Remote;   Fair  Judgement:  Fair  Insight:  Fair  Psychomotor Activity:  UTA  Concentration:  Concentration: Fair and Attention Span: Fair  Recall:  [employer] of Knowledge: Fair  Language: Fair  Akathisia:  No  Handed:  Right  AIMS (if indicated): na  Assets:  Communication Skills Desire for Improvement Social Support  ADL's:  Intact  Cognition: WNL  Sleep:  Fair   Screenings:   Assessment and Plan: Jazzmen is a 53 year old Caucasian female, widowed, lives in [place], has a history of seizure disorder, degenerative disc disease, urinary incontinence, chronic pain was evaluated by phone  today.  Patient with history of  depression and anxiety.  She is biologically predisposed given her multiple medical problems including pain as well as family history of mental health problems.  She also has psychosocial stressors of living with chronic health problems, relationship struggles.  Patient today reports she has been noncompliant with treatment recommendations and has not started her Zoloft yet.  Patient today reports she does not have any depression and the only worry she has is about her relationship with her daughter.  Patient reports she is not interested in medications and wants to pursue psychotherapy.  Discussed with patient to continue her therapy visits with Ms. Alden Hipp.  She has an upcoming appointment with her.  Plan MDD- unstable Patient noncompliant with Zoloft. Patient reports she is interested in CBT.  Discussed with her to continue therapy with Ms. Alden Hipp therapist here in clinic.  For anxiety disorder-unstable Patient does not want medications.  She will continue psychotherapy sessions at this time.  I have reviewed labs ordered-TSH- 11/04/2018-within normal limits.  Follow-up in clinic as needed .  I have spent atleast 15 minutes non face to face with patient today. More than 50 % of the time was spent for psychoeducation and supportive psychotherapy and care coordination.  This note was generated in part or whole with voice recognition software. Voice recognition is usually quite accurate but there are transcription errors that can and very often do occur. I apologize for any typographical errors that were not detected and corrected.        Ursula Alert, MD 11/18/2018, 12:15 PM

## 2018-11-18 NOTE — Progress Notes (Signed)
Tc on 11-18-18, medical and surgical hx was reviewed with no changes, mediations and pharmacy was reviewed and updated. Vitals were not taken due to this is a phone consult.

## 2018-11-22 ENCOUNTER — Other Ambulatory Visit: Payer: Self-pay

## 2018-11-22 ENCOUNTER — Encounter: Payer: Self-pay | Admitting: Licensed Clinical Social Worker

## 2018-11-22 ENCOUNTER — Ambulatory Visit (INDEPENDENT_AMBULATORY_CARE_PROVIDER_SITE_OTHER): Payer: 59 | Admitting: Licensed Clinical Social Worker

## 2018-11-22 DIAGNOSIS — F320 Major depressive disorder, single episode, mild: Secondary | ICD-10-CM | POA: Diagnosis not present

## 2018-11-22 DIAGNOSIS — F419 Anxiety disorder, unspecified: Secondary | ICD-10-CM | POA: Diagnosis not present
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
## 2018-12-01 ENCOUNTER — Encounter: Payer: Self-pay | Admitting: Licensed Clinical Social Worker

## 2018-12-01 ENCOUNTER — Ambulatory Visit (INDEPENDENT_AMBULATORY_CARE_PROVIDER_SITE_OTHER): Payer: 59 | Admitting: Licensed Clinical Social Worker

## 2018-12-01 ENCOUNTER — Other Ambulatory Visit: Payer: Self-pay

## 2018-12-01 DIAGNOSIS — F320 Major depressive disorder, single episode, mild: Secondary | ICD-10-CM

## 2018-12-01 NOTE — Progress Notes (Signed)
Virtual Visit via Telephone Note  I connected with [name] on 12/01/18 at 12:30 PM EDT by telephone and verified that I am speaking with the correct person using two identifiers.   I discussed the limitations, risks, security and privacy concerns of performing an evaluation and management service by telephone and the availability of in person appointments. I also discussed with the patient that there may be a patient responsible charge related to this service. The patient expressed understanding and agreed to proceed.  I discussed the assessment and treatment plan with the patient. The patient was provided an opportunity to ask questions and all were answered. The patient agreed with the plan and demonstrated an understanding of the instructions.   The patient was advised to call back or seek an in-person evaluation if the symptoms worsen or if the condition fails to improve as anticipated.  I provided 30 minutes of non-face-to-face time during this encounter.   Alden Hipp, [name]    THERAPIST PROGRESS NOTE  Session Time: 1300  Participation Level: Active  Behavioral Response: NAAlertIrritable  Type of Therapy: Individual Therapy  Treatment Goals addressed: Coping  Interventions: Solution Focused and Supportive  Summary: KARIMA CARRELL is a 53 y.o. female who presents with continued symptoms of her diagnosis. Solimar reports doing "okay," since our last session. She reports feeling a bit "depressed because I'm just sitting in the house by myself." Kenyia went on to discuss feeling left out of the relationship her daughter has with her mother. She reports they often go do things in the community without inviting her or letting her know what they plan to be doing. "I just wish they would at least invite me." [name] asked Shae to discuss the dynamics and why she feels they may be leaving her out. She reports her daughter and mother "team up on her." They have told her that  she tends to yell and she has mood swings. Anushri disagrees with their assessment, and says she only raises her voices so they can hear her more effectively. [name] encouraged Tavon to attempt to lower her voice and utilize assertive communication to let her family know how she is feeling--and also encouraged Sorayah to attempt to do so without expressing anger. Xitlally stated she was unsure if she would be able to do this because she feels so angry, but did agree to try. [name] also encouraged Travis to set more firm boundaries with her 50 year old daughter. She reports she has tried to and "it didn't work." [name] asked Reynalda to attempt discussing this with her and her mother again, and see what results she can yield. Additionally, [name] asked Raeann to ask her daughter to join our next therapy call. Caeleigh expressed she would attempt to do so.   Suicidal/Homicidal: No  Therapist Response: Lariya continues to work towards her tx goals but has not yet reached them. She is not yet able to effectively regulate her emotions in the moment, but we will continue to work towards that. We will utilize CBT and DBT to assist [place] in staying safe and calm in the moment moving forward.   Plan: Return again in 2 weeks.  Diagnosis: Axis I: MDD    Axis II: No diagnosis    Alden Hipp, [name] 12/01/2018

## 2018-12-13 ENCOUNTER — Ambulatory Visit (INDEPENDENT_AMBULATORY_CARE_PROVIDER_SITE_OTHER): Payer: 59 | Admitting: Licensed Clinical Social Worker

## 2018-12-13 ENCOUNTER — Encounter: Payer: Self-pay | Admitting: Licensed Clinical Social Worker

## 2018-12-13 ENCOUNTER — Other Ambulatory Visit: Payer: Self-pay

## 2018-12-13 DIAGNOSIS — F320 Major depressive disorder, single episode, mild: Secondary | ICD-10-CM | POA: Diagnosis not present

## 2018-12-13 DIAGNOSIS — F419 Anxiety disorder, unspecified: Secondary | ICD-10-CM | POA: Diagnosis not present

## 2018-12-13 NOTE — Progress Notes (Signed)
Virtual Visit via Telephone Note  I connected with [name] on 12/13/18 at 11:00 AM EDT by telephone and verified that I am speaking with the correct person using two identifiers.   I discussed the limitations, risks, security and privacy concerns of performing an evaluation and management service by telephone and the availability of in person appointments. I also discussed with the patient that there may be a patient responsible charge related to this service. The patient expressed understanding and agreed to proceed.    I discussed the assessment and treatment plan with the patient. The patient was provided an opportunity to ask questions and all were answered. The patient agreed with the plan and demonstrated an understanding of the instructions.   The patient was advised to call back or seek an in-person evaluation if the symptoms worsen or if the condition fails to improve as anticipated.  I provided 30 minutes of non-face-to-face time during this encounter.   Alden Hipp, [name]    THERAPIST PROGRESS NOTE  Session Time: 1100  Participation Level: Active  Behavioral Response: NAAlertAnxious  Type of Therapy: Individual Therapy  Treatment Goals addressed: Coping  Interventions: Solution Focused  Summary: CARLIS BURNSWORTH is a 53 y.o. female who presents with continued symptoms of her diagnosis. Genelda reports doing well since our last session, and reports she spoke with her mother regarding setting more firm boundaries and feeling left out of outings. Derotha reported her mother was receptive to the information she provided, but Aliciana felt she didn't fully understand what she was trying to convey. Jeralyn reported her mother stated she had instructed Silvia's daughter to ask Gwendy to go to various things with them, but Mardie's daughter never asked her. [name] encouraged Marytza to have a conversation with both her mother and daughter at the same time, even if  it is via phone, in order to get everyone on the same page. Jasani expressed agreement with this idea. Additionally, [name] asked if Latrell was able to discuss her daughter joining today's session. Dakayla reported she spoke with her about it, "but she didn't really say much about it." Juelle was not able to elaborate on how her daughter responded to the request, other than to say, "she didn't say much of anything. She's still going with my mother if she doesn't like something I cooked. Or, she won't do anything I say." [name] encouraged Avion to have another conversation with her daughter and mother wherein they set boundaries and expectations regarding house work, and what will happen if pt's daughter contacts pt's mother to avoid eating what Johnice has prepared or avoid other household obligations. Keerstin reported she would attempt to do this. [name] also asked Hae to encourage both her mother and daughter to be available for her next session in order to get everyone on the same page moving forward.    Suicidal/Homicidal: No Therapist Response: Simora continues to work towards her tx goals but has not yet reached them. We will continue to work on assertiveness training and communication moving forward.   Plan: Return again in 2 weeks.  Diagnosis: Axis I: MDD    Axis II: No diagnosis    Alden Hipp, [name] 12/13/2018

## 2018-12-28 ENCOUNTER — Encounter: Payer: Self-pay | Admitting: Licensed Clinical Social Worker

## 2018-12-28 ENCOUNTER — Other Ambulatory Visit: Payer: Self-pay

## 2018-12-28 ENCOUNTER — Ambulatory Visit (INDEPENDENT_AMBULATORY_CARE_PROVIDER_SITE_OTHER): Payer: 59 | Admitting: Licensed Clinical Social Worker

## 2018-12-28 DIAGNOSIS — F419 Anxiety disorder, unspecified: Secondary | ICD-10-CM

## 2018-12-28 DIAGNOSIS — F320 Major depressive disorder, single episode, mild: Secondary | ICD-10-CM | POA: Diagnosis not present

## 2018-12-28 NOTE — Progress Notes (Signed)
Virtual Visit via Telephone Note  I connected with Cindy Mcclure on 12/28/18 at  3:30 PM EDT by telephone and verified that I am speaking with the correct person using two identifiers.   I discussed the limitations, risks, security and privacy concerns of performing an evaluation and management service by telephone and the availability of in person appointments. I also discussed with the patient that there may be a patient responsible charge related to this service. The patient expressed understanding and agreed to proceed.  I discussed the assessment and treatment plan with the patient. The patient was provided an opportunity to ask questions and all were answered. The patient agreed with the plan and demonstrated an understanding of the instructions.   The patient was advised to call back or seek an in-person evaluation if the symptoms worsen or if the condition fails to improve as anticipated.  I provided 30 minutes of non-face-to-face time during this encounter.   Alden Hipp, [name]    THERAPIST PROGRESS NOTE  Session Time: 9323  Participation Level: Active  Behavioral Response: CasualAlertIrritable  Type of Therapy: Individual Therapy  Treatment Goals addressed: Anxiety  Interventions: Supportive  Summary: Cindy Mcclure is a 53 y.o. female who presents with continued symptoms of her diagnosis. Cindy Mcclure reports she has been "about the same," since our last session. She reports talking with her mother to discuss the issues between the two of them. She reports her mother stated she needed to work on her anger and not being loud at times. Cindy Mcclure reports she does not feel she does those things, and feels she just speaks loudly sometimes but not in an angry way. [name] asked if Cindy Mcclure expressed this to her mother, which Kenyada reports she did not. [name] encouraged Thao to revisit the conversation with her mother and ask her to elaborate more on what she feels Cindy Mcclure needs  to improve on to ultimately improve their relationship. Jannetta expressed agreement. We then moved on to discussing her relationship with her daughter. Cindy Mcclure reports nothing has changed in that department, and she has not been able to discuss anything with her. She reports, "I had to go with her to her doctor and she told me I told the doctor a bunch of lies, but I didn't." [name] encouraged Cindy Mcclure to try talking with her daughter about why she does not want to spend time together. Cindy Mcclure reports her daughter will say things that "don't make any sense to me." [name] encouraged Cindy Mcclure to ask her daughter to give her examples of different things that have bothered her to provide more information so Cindy Mcclure is better able to understand what the problem is. Cindy Mcclure expressed understanding and agreement with this idea as well.   Suicidal/Homicidal: No  Therapist Response: Cindy Mcclure continues to work towards her tx goals but has not yet reached them. We will continue to work on emotional regulation skills and improving communication as well.   Plan: Return again in 3 weeks.  Diagnosis: Axis I: MDD    Axis II: No diagnosis    Alden Hipp, [name] 12/28/2018

## 2019-01-07 ENCOUNTER — Other Ambulatory Visit: Payer: Self-pay | Admitting: Physical Medicine and Rehabilitation

## 2019-01-07 DIAGNOSIS — M5416 Radiculopathy, lumbar region: Secondary | ICD-10-CM

## 2019-01-17 ENCOUNTER — Other Ambulatory Visit: Payer: Self-pay

## 2019-01-17 ENCOUNTER — Ambulatory Visit (INDEPENDENT_AMBULATORY_CARE_PROVIDER_SITE_OTHER): Payer: 59 | Admitting: Licensed Clinical Social Worker

## 2019-01-17 ENCOUNTER — Encounter: Payer: Self-pay | Admitting: Licensed Clinical Social Worker

## 2019-01-17 DIAGNOSIS — F320 Major depressive disorder, single episode, mild: Secondary | ICD-10-CM | POA: Diagnosis not present

## 2019-01-17 NOTE — Progress Notes (Signed)
Virtual Visit via Telephone Note  I connected with Cindy Mcclure on 01/17/19 at  1:30 PM EDT by telephone and verified that I am speaking with the correct person using two identifiers.   I discussed the limitations, risks, security and privacy concerns of performing an evaluation and management service by telephone and the availability of in person appointments. I also discussed with the patient that there may be a patient responsible charge related to this service. The patient expressed understanding and agreed to proceed.   I discussed the assessment and treatment plan with the patient. The patient was provided an opportunity to ask questions and all were answered. The patient agreed with the plan and demonstrated an understanding of the instructions.   The patient was advised to call back or seek an in-person evaluation if the symptoms worsen or if the condition fails to improve as anticipated.  I provided 40 minutes of non-face-to-face time during this encounter.   Alden Hipp, Cindy Mcclure    THERAPIST PROGRESS NOTE  Session Time: 1400  Participation Level: Active  Behavioral Response: NAAlertAnxious  Type of Therapy: Individual Therapy  Treatment Goals addressed: Coping  Interventions: Supportive  Summary: Cindy Mcclure is a 53 y.o. female who presents with continued symptoms related to her diagnosis. Cindy Mcclure reports doing well since our last session, and stated her daughter has allowed her to meet more of her friends due to [place] allowing her daughter to do more things outside the home. Cindy Mcclure validated Cindy Mcclure's attempts to actively work on her relationship with her daughter. Cindy Mcclure reported she is trying, though noted it has not been easy. Cindy Mcclure went on to discuss her mother and daughter keep telling her she needs to "take something," for her mental health symptoms. Cindy Mcclure questioned this statement, as Cindy Mcclure's chart indicates she is on several medications related to  her diagnosis. Cindy Mcclure reports she has not taken medication prescribed to her by the MD in the clinic, as she wanted to discuss it with Cindy Mcclure first. Cindy Mcclure explained any medication questions would need to be directed to MD, as Cindy Mcclure does not have the education to answer medication questions. Cindy Mcclure also questioned why Cindy Mcclure had not previously brought this up in conversation as she was prescribed those medications in April. Cindy Mcclure reported, "I guess I've just had other things on my mind." Cindy Mcclure encouraged Cindy Mcclure to schedule an appointment with the MD in order to get back on the appropriate medications for her symptoms. Cindy Mcclure expressed understanding and agreement witht his suggestion. Cindy Mcclure encouraged Cindy Mcclure to continue improving her communication with her daughter, and articulating her feelings in an effective, appropriate way. Cindy Mcclure reported she planned to work on these things moving forward.   Suicidal/Homicidal: No  Therapist Response: Cindy Mcclure continues to work towards her tx goals but has not yet reached them. We will continue to work on emotional regulation skills and communication skills moving forward.   Plan: Return again in 2 weeks.  Diagnosis: Axis I: MDD    Axis II: No diagnosis    Alden Hipp, Cindy Mcclure 01/17/2019

## 2019-01-20 ENCOUNTER — Ambulatory Visit
Admission: RE | Admit: 2019-01-20 | Discharge: 2019-01-20 | Disposition: A | Discharge status: Home / Self Care | Payer: 59 | Source: Ambulatory Visit | Attending: Physical Medicine and Rehabilitation | Admitting: Physical Medicine and Rehabilitation

## 2019-01-20 ENCOUNTER — Ambulatory Visit (INDEPENDENT_AMBULATORY_CARE_PROVIDER_SITE_OTHER): Payer: 59 | Admitting: Psychiatry

## 2019-01-20 ENCOUNTER — Other Ambulatory Visit: Payer: Self-pay

## 2019-01-20 ENCOUNTER — Encounter: Payer: Self-pay | Admitting: Psychiatry

## 2019-01-20 DIAGNOSIS — F411 Generalized anxiety disorder: Secondary | ICD-10-CM | POA: Diagnosis not present

## 2019-01-20 DIAGNOSIS — M5416 Radiculopathy, lumbar region: Secondary | ICD-10-CM | POA: Insufficient documentation

## 2019-01-20 DIAGNOSIS — F320 Major depressive disorder, single episode, mild: Secondary | ICD-10-CM

## 2019-01-20 MED ORDER — SERTRALINE HCL 25 MG PO TABS: 25.0000 mg | ORAL_TABLET | Freq: Every day | ORAL | 1 refills | Status: DC

## 2019-01-20 NOTE — Progress Notes (Signed)
Virtual Visit via Telephone Note  I connected with Cindy Mcclure on 01/20/19 at  9:45 AM EDT by telephone and verified that I am speaking with the correct person using two identifiers.   I discussed the limitations, risks, security and privacy concerns of performing an evaluation and management service by telephone and the availability of in person appointments. I also discussed with the patient that there may be a patient responsible charge related to this service. The patient expressed understanding and agreed to proceed.    I discussed the assessment and treatment plan with the patient. The patient was provided an opportunity to ask questions and all were answered. The patient agreed with the plan and demonstrated an understanding of the instructions.   The patient was advised to call back or seek an in-person evaluation if the symptoms worsen or if the condition fails to improve as anticipated. Lyle MD OP Progress Note  01/20/2019 11:41 AM Cindy Mcclure  MRN:  [id number]  Chief Complaint:  Chief Complaint    Follow-up     HPI: Cindy Mcclure is a 53 year old Caucasian female on disability, lives in [place], has a history of depression, anxiety, seizure disorder, urinary incontinence, degenerative disc disease was evaluated by phone today.  Patient preferred to do a phone call.  Patient today reports she continues to struggle with sadness, crying spells, lack of energy.  She also has a lot of anxiety symptoms.  She reports she is often restless and on edge.  She continues to struggle with relationship problems with her daughter and her mother.  She reports sleep has been okay so far.  Patient today reports she never picked up her Zoloft medication which was sent out in March 2020.  She reports she however was advised by her therapist to start medications and she wants to be restarted on Zoloft.  Discussed to start a low-dose of 25 mg.  Patient denies any suicidality, homicidality or  perceptual disturbances.  Advised to continue psychotherapy sessions. Visit Diagnosis:    ICD-10-CM   1. MDD (major depressive disorder), single episode, mild (HCC) F32.0 sertraline (ZOLOFT) 25 MG tablet  2. GAD (generalized anxiety disorder) F41.1     Past Psychiatric History: I have reviewed past psychiatric history from my progress note on 11/04/2018.  Past Medical History:  Past Medical History:  Diagnosis Date  . Allergic rhinitis   . Anxiety   . Arthritis    all over...knees, ankles, back  . Back pain   . Cervical radiculitis    no surgery, just cortisone shots.    . Constipation   . Constipation   . DDD (degenerative disc disease), cervical   . Dermatophytosis of nail 2019   both feet  . Osteoarthritis   . Plantar fasciitis    bilaterally  . Seizures ([place]) 1972   unsure of when she had last seizure due to petit mal type. does not know what brings on a seizure.  . Stress bladder incontinence, female   . Urinary incontinence     Past Surgical History:  Procedure Laterality Date  . CESAREAN SECTION  [id number]  . CHOLECYSTECTOMY N/A 12/06/2015   Procedure: LAPAROSCOPIC CHOLECYSTECTOMY WITH INTRAOPERATIVE CHOLANGIOGRAM;  Surgeon: Dia Crawford III, MD;  Location: [facility] ORS;  Service: General;  Laterality: N/A;  . COLONOSCOPY WITH PROPOFOL N/A 09/29/2016   Procedure: COLONOSCOPY WITH PROPOFOL;  Surgeon: Manya Silvas, MD;  Location[facility] ENDOSCOPY;  Service: Endoscopy;  Laterality: N/A;  . KNEE ARTHROSCOPY WITH EXCISION PLICA Left 1/88/4166  Procedure: KNEE ARTHROSCOPY WITH EXCISION PLICA, PARTIAL SYNOVECTOMY;  Surgeon: Hessie Knows, MD;  Location: [facility] ORS;  Service: Orthopedics;  Laterality: Left;  . KNEE ARTHROSCOPY WITH MEDIAL MENISECTOMY Right 09/03/2017   Procedure: KNEE ARTHROSCOPY WITH MEDIAL MENISECTOMY, PARTIAL SYNOVECTOMY;  Surgeon: Hessie Knows, MD;  Location: [facility] ORS;  Service: Orthopedics;  Laterality: Right;  . SURGERY FOR SEIZURES     nothing implanted in  head. she was 53 years old. [facility]  . TUBAL LIGATION  2003    Family Psychiatric History: I have reviewed family psychiatric history from my progress note on 11/04/2018.  Family History:  Family History  Problem Relation Age of Onset  . Anxiety disorder Daughter     Social History: Have reviewed social history from my progress note on 11/04/2018. Social History   Socioeconomic History  . Marital status: Divorced    Spouse name: Not on file  . Number of children: Not on file  . Years of education: Not on file  . Highest education level: Not on file  Occupational History  . Not on file  Social Needs  . Financial resource strain: Not on file  . Food insecurity:    Worry: Not on file    Inability: Not on file  . Transportation needs:    Medical: Not on file    Non-medical: Not on file  Tobacco Use  . Smoking status: Former Smoker    Packs/day: 0.25    Last attempt to quit: 08/17/2012    Years since quitting: 6.4  . Smokeless tobacco: Never Used  . Tobacco comment: smokers in home  Substance and Sexual Activity  . Alcohol use: Not Currently    Comment: only on special occasions   . Drug use: No    Comment: denies  . Sexual activity: Not Currently  Lifestyle  . Physical activity:    Days per week: Not on file    Minutes per session: Not on file  . Stress: Not on file  Relationships  . Social connections:    Talks on phone: Not on file    Gets together: Not on file    Attends religious service: Not on file    Active member of club or organization: Not on file    Attends meetings of clubs or organizations: Not on file    Relationship status: Not on file  Other Topics Concern  . Not on file  Social History Narrative  . Not on file    Allergies: No Known Allergies  Metabolic Disorder Labs: No results found for: HGBA1C, MPG No results found for: PROLACTIN No results found for: CHOL, TRIG, HDL, CHOLHDL, VLDL, LDLCALC Lab Results  Component Value Date    TSH 1.930 11/04/2018    Therapeutic Level Labs: No results found for: LITHIUM No results found for: VALPROATE No components found for:  CBMZ  Current Medications: Current Outpatient Medications  Medication Sig Dispense Refill  . BIOTIN PO Take 1 capsule by mouth every morning. 500 mg    . Calcium Carb-Cholecalciferol (CALCIUM 600 + D PO) Take 1 tablet by mouth daily.    . cloBAZam (ONFI) 20 MG tablet Take 20 mg by mouth at bedtime.     . cyclobenzaprine (FLEXERIL) 5 MG tablet 1/2-1 po qHS prn    . diclofenac (VOLTAREN) 75 MG EC tablet Take 75 mg by mouth 2 (two) times daily.    . Flaxseed, Linseed, (FLAXSEED OIL) 1000 MG CAPS Take 1,000 mg by mouth daily.    Marland Kitchen  ibuprofen (ADVIL,MOTRIN) 200 MG tablet Take 400 mg by mouth every 8 (eight) hours as needed for mild pain.    Marland Kitchen lamoTRIgine (LAMICTAL) 150 MG tablet Take 150 mg by mouth 2 (two) times daily.     . Levetiracetam (KEPPRA XR) 750 MG TB24 Take 750 mg by mouth 2 (two) times daily.    . Multiple Vitamins-Minerals (CENTRUM ADULTS PO) Take 1 tablet by mouth daily.     Marland Kitchen omeprazole (PRILOSEC) 20 MG capsule Take by mouth.    . pyridOXINE (VITAMIN B-6) 100 MG tablet Take by mouth.    . senna-docusate (SENOKOT-S) 8.6-50 MG tablet Take 4 tablets by mouth daily as needed for mild constipation.    . sertraline (ZOLOFT) 25 MG tablet Take 1 tablet (25 mg total) by mouth daily with breakfast. 30 tablet 1  . traMADol (ULTRAM) 50 MG tablet Take 50 mg by mouth every 12 (twelve) hours as needed for moderate pain (pain).     Marland Kitchen VITAMIN A PO Take 8,000 Units by mouth daily.     . vitamin B-12 (CYANOCOBALAMIN) 1000 MCG tablet Take by mouth.    . vitamin C (ASCORBIC ACID) 500 MG tablet Take 500 mg by mouth daily.    Marland Kitchen VITAMIN E PO Take 1 tablet by mouth daily.    Marland Kitchen zonisamide (ZONEGRAN) 100 MG capsule Take 400 mg by mouth daily with supper.     No current facility-administered medications for this visit.      Musculoskeletal: Strength & Muscle Tone:  within normal limits Gait & Station: UTA Patient leans: N/A  Psychiatric Specialty Exam: Review of Systems  Psychiatric/Behavioral: Positive for depression. The patient is nervous/anxious.   All other systems reviewed and are negative.   There were no vitals taken for this visit.There is no height or weight on file to calculate BMI.  General Appearance: UTA  Eye Contact:  UTA  Speech:  Clear and Coherent  Volume:  Normal  Mood:  Anxious and Depressed  Affect:  UTA  Thought Process:  Goal Directed and Descriptions of Associations: Intact  Orientation:  Full (Time, Place, and Person)  Thought Content: Logical   Suicidal Thoughts:  No  Homicidal Thoughts:  No  Memory:  Immediate;   Fair Recent;   Fair Remote;   Fair  Judgement:  Fair  Insight:  Fair  Psychomotor Activity:  UTA  Concentration:  Concentration: Fair and Attention Span: Fair  Recall:  [employer] of Knowledge: Fair  Language: Fair  Akathisia:  No  Handed:  Right  AIMS (if indicated): Denies tremors, rigidity, stiffness  Assets:  Communication Skills Desire for Improvement Social Support  ADL's:  Intact  Cognition: WNL  Sleep:  Fair   Screenings:   Assessment and Plan: Cindy Mcclure is a 53 year old Caucasian female, widowed, lives in [place], has a history of seizure disorder, degenerative disc disease, urinary incontinence, chronic pain, MDD, GAD was evaluated by phone today.  Patient preferred to do a phone call.  She is biologically predisposed given her multiple medical problems including pain as well as family history of mental health problems.  She also has psychosocial stressors of living with chronic health problems, relationship struggles.  Patient today reports she has been noncompliant with treatment recommendation however would like to be restarted on Zoloft.  She continues to struggle with mood symptoms and will benefit from medications as well as psychotherapy sessions.  Plan MDD-unstable Start  Zoloft 25 mg p.o. daily Continue psychotherapy sessions with Ms. Cecilie Lowers.  For  GAD-unstable Zoloft 25 mg p.o. daily Continue CBT  Follow-up in clinic in 3 to 4 weeks or sooner if needed.  July 9 at 10:15 AM.  I have spent atleast 15 minutes non face to face with patient today. More than 50 % of the time was spent for psychoeducation and supportive psychotherapy and care coordination.  This note was generated in part or whole with voice recognition software. Voice recognition is usually quite accurate but there are transcription errors that can and very often do occur. I apologize for any typographical errors that were not detected and corrected.        Ursula Alert, MD 01/20/2019, 11:41 AM

## 2019-02-02 ENCOUNTER — Other Ambulatory Visit: Payer: Self-pay

## 2019-02-02 ENCOUNTER — Ambulatory Visit (INDEPENDENT_AMBULATORY_CARE_PROVIDER_SITE_OTHER): Payer: 59 | Admitting: Licensed Clinical Social Worker

## 2019-02-02 ENCOUNTER — Encounter: Payer: Self-pay | Admitting: Licensed Clinical Social Worker

## 2019-02-02 DIAGNOSIS — F411 Generalized anxiety disorder: Secondary | ICD-10-CM

## 2019-02-02 DIAGNOSIS — F320 Major depressive disorder, single episode, mild: Secondary | ICD-10-CM | POA: Diagnosis not present

## 2019-02-02 NOTE — Progress Notes (Signed)
  Virtual Visit via Telephone Note  I connected with [name] on 02/02/19 at 11:00 AM EDT by telephone and verified that I am speaking with the correct person using two identifiers.   I discussed the limitations, risks, security and privacy concerns of performing an evaluation and management service by telephone and the availability of in person appointments. I also discussed with the patient that there may be a patient responsible charge related to this service. The patient expressed understanding and agreed to proceed  I discussed the assessment and treatment plan with the patient. The patient was provided an opportunity to ask questions and all were answered. The patient agreed with the plan and demonstrated an understanding of the instructions.   The patient was advised to call back or seek an in-person evaluation if the symptoms worsen or if the condition fails to improve as anticipated.  I provided 20 minutes of non-face-to-face time during this encounter.   Alden Hipp, [name]   THERAPIST PROGRESS NOTE  Session Time: 1100  Participation Level: Minimal  Behavioral Response: NAAlertIrritable  Type of Therapy: Individual Therapy  Treatment Goals addressed: Anxiety  Interventions: Supportive  Summary: CALLEY DRENNING is a 53 y.o. female who presents with continued symptoms related to her diagnosis. Kem reports doing well since our last session. She reports her relationship with her daughter has improved, and she had no complaints to report regarding her daughter. She reported feeling she doesn't see her mother enough, but was able to recognize this aligned with what she asked of her mother--which was to step back from interfering with the relationship Vernal has with her daughter. Abigayl was irritable when [name] pointed that out, but was able to understand she needs to communicate exactly what she expects from her mother instead of assuming her mother is aware.  Niang reported she is planning to attend her oldest daughter's wedding on the 30th of this month, and she is traveling to the wedding with her youngest daughter. She reports being excited and nervous, "because my oldest daughter feels negatively towards me sometimes." [name] encouraged Teondra to take things as they come, and to try not to anticipate how her daughter will behave towards her. Shterna expressed agreement and understanding. Lakeisa reported she began taking the Zoloft prescribed by MD in the clinic. She reports feeling drowsy, but stated she has not experienced any other side effects.   Suicidal/Homicidal: No  Therapist Response: Marvette continues to work towards her tx goals but has not yet reached them. She will continue to work towards challenging negative thoughts via CBT and will continue to work on her communication skills.   Plan: Return again in 4 weeks.  Diagnosis: Axis I: MDD    Axis II: No diagnosis    Alden Hipp, [name] 02/02/2019

## 2019-02-11 ENCOUNTER — Telehealth: Payer: Self-pay | Admitting: Psychiatry

## 2019-02-11 DIAGNOSIS — F320 Major depressive disorder, single episode, mild: Secondary | ICD-10-CM

## 2019-02-11 MED ORDER — SERTRALINE HCL 25 MG PO TABS: 25.0000 mg | ORAL_TABLET | Freq: Every day | ORAL | 1 refills | Status: DC

## 2019-02-11 NOTE — Telephone Encounter (Signed)
Sent zoloft for 90 days to pharmacy

## 2019-02-24 ENCOUNTER — Other Ambulatory Visit: Payer: Self-pay

## 2019-02-24 ENCOUNTER — Encounter: Payer: Self-pay | Admitting: Psychiatry

## 2019-02-24 ENCOUNTER — Ambulatory Visit (INDEPENDENT_AMBULATORY_CARE_PROVIDER_SITE_OTHER): Payer: 59 | Admitting: Psychiatry

## 2019-02-24 DIAGNOSIS — F320 Major depressive disorder, single episode, mild: Secondary | ICD-10-CM | POA: Diagnosis not present

## 2019-02-24 DIAGNOSIS — F411 Generalized anxiety disorder: Secondary | ICD-10-CM | POA: Diagnosis not present

## 2019-02-24 MED ORDER — SERTRALINE HCL 25 MG PO TABS: 25.0000 mg | ORAL_TABLET | Freq: Every day | ORAL | 1 refills | Status: DC

## 2019-02-24 NOTE — Progress Notes (Signed)
Virtual Visit via Video Note  I connected with Cindy Mcclure on 02/24/19 at 10:15 AM EDT by a video enabled telemedicine application and verified that I am speaking with the correct person using two identifiers.   I discussed the limitations of evaluation and management by telemedicine and the availability of in person appointments. The patient expressed understanding and agreed to proceed.   I discussed the assessment and treatment plan with the patient. The patient was provided an opportunity to ask questions and all were answered. The patient agreed with the plan and demonstrated an understanding of the instructions.   The patient was advised to call back or seek an in-person evaluation if the symptoms worsen or if the condition fails to improve as anticipated.  [place] MD OP Progress Note  02/24/2019 7:35 PM Cindy Mcclure  MRN:  [id number]  Chief Complaint:  Chief Complaint    Follow-up     HPI: Cindy Mcclure is a 53 year old Caucasian female, on disability, lives in [place] has a history of MDD, GAD, seizure disorder, urinary incontinence, degenerative disc disease was evaluated by telemedicine today.  Patient today reports she has been taking her Zoloft.  She however reports it makes her extremely tired during the day.  She reports her Zoloft as helpful with her mood symptoms.  She denies any significant depressive symptoms at this time.  She however reports some anxiety about her current situational stressors.  She reports she went to her daughter's wedding and felt like she was ignored by her daughter the entire time.  She ruminates about it this makes her sad at times.  She otherwise denies any suicidality, homicidality or perceptual disturbances.  She reports sleep is good.  Discussed with patient to continue to work with her therapist relationship struggles.  Patient denies any other concerns today.   Visit Diagnosis:    ICD-10-CM   1. MDD (major depressive disorder),  single episode, mild (HCC)  F32.0 sertraline (ZOLOFT) 25 MG tablet  2. GAD (generalized anxiety disorder)  F41.1     Past Psychiatric History: I have reviewed past psychiatric history from my progress note on 11/04/2018.  Past Medical History:  Past Medical History:  Diagnosis Date  . Allergic rhinitis   . Anxiety   . Arthritis    all over...knees, ankles, back  . Back pain   . Cervical radiculitis    no surgery, just cortisone shots.    . Constipation   . Constipation   . DDD (degenerative disc disease), cervical   . Dermatophytosis of nail 2019   both feet  . Osteoarthritis   . Plantar fasciitis    bilaterally  . Seizures ([place]) 1972   unsure of when she had last seizure due to petit mal type. does not know what brings on a seizure.  . Stress bladder incontinence, female   . Urinary incontinence     Past Surgical History:  Procedure Laterality Date  . CESAREAN SECTION  [id number]  . CHOLECYSTECTOMY N/A 12/06/2015   Procedure: LAPAROSCOPIC CHOLECYSTECTOMY WITH INTRAOPERATIVE CHOLANGIOGRAM;  Surgeon: Dia Crawford III, MD;  Location: [facility] ORS;  Service: General;  Laterality: N/A;  . COLONOSCOPY WITH PROPOFOL N/A 09/29/2016   Procedure: COLONOSCOPY WITH PROPOFOL;  Surgeon: Manya Silvas, MD;  Location[facility] ENDOSCOPY;  Service: Endoscopy;  Laterality: N/A;  . KNEE ARTHROSCOPY WITH EXCISION PLICA Left 12/04/6220   Procedure: KNEE ARTHROSCOPY WITH EXCISION PLICA, PARTIAL SYNOVECTOMY;  Surgeon: Hessie Knows, MD;  Location: [facility] ORS;  Service: Orthopedics;  Laterality: Left;  .  KNEE ARTHROSCOPY WITH MEDIAL MENISECTOMY Right 09/03/2017   Procedure: KNEE ARTHROSCOPY WITH MEDIAL MENISECTOMY, PARTIAL SYNOVECTOMY;  Surgeon: Hessie Knows, MD;  Location: [facility] ORS;  Service: Orthopedics;  Laterality: Right;  . SURGERY FOR SEIZURES     nothing implanted in head. she was 53 years old. done at [facility]  . TUBAL LIGATION  2003    Family Psychiatric History: I have reviewed family psychiatric history  from my progress note on 11/04/2018.  Family History:  Family History  Problem Relation Age of Onset  . Anxiety disorder Daughter     Social History: I have reviewed social history from my progress note on 11/04/2018. Social History   Socioeconomic History  . Marital status: Divorced    Spouse name: Not on file  . Number of children: Not on file  . Years of education: Not on file  . Highest education level: Not on file  Occupational History  . Not on file  Social Needs  . Financial resource strain: Not on file  . Food insecurity    Worry: Not on file    Inability: Not on file  . Transportation needs    Medical: Not on file    Non-medical: Not on file  Tobacco Use  . Smoking status: Former Smoker    Packs/day: 0.25    Quit date: 08/17/2012    Years since quitting: 6.5  . Smokeless tobacco: Never Used  . Tobacco comment: smokers in home  Substance and Sexual Activity  . Alcohol use: Not Currently    Comment: only on special occasions   . Drug use: No    Comment: denies  . Sexual activity: Not Currently  Lifestyle  . Physical activity    Days per week: Not on file    Minutes per session: Not on file  . Stress: Not on file  Relationships  . Social Herbalist on phone: Not on file    Gets together: Not on file    Attends religious service: Not on file    Active member of club or organization: Not on file    Attends meetings of clubs or organizations: Not on file    Relationship status: Not on file  Other Topics Concern  . Not on file  Social History Narrative  . Not on file    Allergies: No Known Allergies  Metabolic Disorder Labs: No results found for: HGBA1C, MPG No results found for: PROLACTIN No results found for: CHOL, TRIG, HDL, CHOLHDL, VLDL, LDLCALC Lab Results  Component Value Date   TSH 1.930 11/04/2018    Therapeutic Level Labs: No results found for: LITHIUM No results found for: VALPROATE No components found for:  CBMZ  Current  Medications: Current Outpatient Medications  Medication Sig Dispense Refill  . BIOTIN PO Take 1 capsule by mouth every morning. 500 mg    . Calcium Carb-Cholecalciferol (CALCIUM 600 + D PO) Take 1 tablet by mouth daily.    . cloBAZam (ONFI) 20 MG tablet Take 20 mg by mouth at bedtime.     . cyclobenzaprine (FLEXERIL) 5 MG tablet 1/2-1 po qHS prn    . diclofenac (VOLTAREN) 75 MG EC tablet Take 75 mg by mouth 2 (two) times daily.    . Flaxseed, Linseed, (FLAXSEED OIL) 1000 MG CAPS Take 1,000 mg by mouth daily.    Marland Kitchen ibuprofen (ADVIL,MOTRIN) 200 MG tablet Take 400 mg by mouth every 8 (eight) hours as needed for mild pain.    Marland Kitchen lamoTRIgine (LAMICTAL)  150 MG tablet Take 150 mg by mouth 2 (two) times daily.     . Levetiracetam (KEPPRA XR) 750 MG TB24 Take 750 mg by mouth 2 (two) times daily.    . Multiple Vitamins-Minerals (CENTRUM ADULTS PO) Take 1 tablet by mouth daily.     Marland Kitchen omeprazole (PRILOSEC) 20 MG capsule Take by mouth.    . pyridOXINE (VITAMIN B-6) 100 MG tablet Take by mouth.    . senna-docusate (SENOKOT-S) 8.6-50 MG tablet Take 4 tablets by mouth daily as needed for mild constipation.    . sertraline (ZOLOFT) 25 MG tablet Take 1 tablet (25 mg total) by mouth at bedtime. She has supplies 90 tablet 1  . traMADol (ULTRAM) 50 MG tablet Take 50 mg by mouth every 12 (twelve) hours as needed for moderate pain (pain).     Marland Kitchen VITAMIN A PO Take 8,000 Units by mouth daily.     . vitamin B-12 (CYANOCOBALAMIN) 1000 MCG tablet Take by mouth.    . vitamin C (ASCORBIC ACID) 500 MG tablet Take 500 mg by mouth daily.    Marland Kitchen VITAMIN E PO Take 1 tablet by mouth daily.    Marland Kitchen zonisamide (ZONEGRAN) 100 MG capsule Take 400 mg by mouth daily with supper.     No current facility-administered medications for this visit.      Musculoskeletal: Strength & Muscle Tone: within normal limits Gait & Station: normal Patient leans: N/A  Psychiatric Specialty Exam: Review of Systems  Constitutional: Positive for  malaise/fatigue.  Psychiatric/Behavioral: The patient is nervous/anxious.   All other systems reviewed and are negative.   There were no vitals taken for this visit.There is no height or weight on file to calculate BMI.  General Appearance: Casual  Eye Contact:  Fair  Speech:  Clear and Coherent  Volume:  Normal  Mood:  Anxious  Affect:  Congruent  Thought Process:  Goal Directed and Descriptions of Associations: Intact  Orientation:  Full (Time, Place, and Person)  Thought Content: Logical   Suicidal Thoughts:  No  Homicidal Thoughts:  No  Memory:  Immediate;   Fair Recent;   Fair Remote;   Fair  Judgement:  Fair  Insight:  Fair  Psychomotor Activity:  Normal  Concentration:  Concentration: Fair and Attention Span: Fair  Recall:  [employer] of Knowledge: Fair  Language: Fair  Akathisia:  No  Handed:  Right  AIMS (if indicated): denies tremors, rigidity  Assets:  Communication Skills Desire for Improvement Housing Social Support  ADL's:  Intact  Cognition: WNL  Sleep:  Fair   Screenings:   Assessment and Plan: Chastelyn is a 53 year old Caucasian female, widowed, lives in [place], has a history of seizure disorder, MDD, GAD, degenerative disc disease, urinary incontinence, chronic pain was evaluated by telemedicine today.  Patient is biologically predisposed given her multiple medical problems and chronic pain as well as family history of mental health problems.  He also has psychosocial stressors of living with chronic health problems, relationship struggles.  Patient reports side effects to Zoloft, also discussed relationship struggles.  Patient will continue to need psychotherapy sessions.  Plan as noted below.  Plan  MDD-improving Zoloft 25 mg p.o. daily, however discussed with her to take it with supper or at bedtime. Continue psychotherapy sessions with Ms. Cecilie Lowers.  Discussed with her to discuss her relationship struggles in terms of psychotherapy sessions.  For  GAD-improving Zoloft as prescribed  Follow-up in clinic in 1 to 2 months or sooner if needed.  September 2 at 10:15 AM  I have spent atleast 15 minutes non face to face with patient today. More than 50 % of the time was spent for psychoeducation and supportive psychotherapy and care coordination.  This note was generated in part or whole with voice recognition software. Voice recognition is usually quite accurate but there are transcription errors that can and very often do occur. I apologize for any typographical errors that were not detected and corrected.       Ursula Alert, MD 02/24/2019, 7:35 PM

## 2019-03-01 ENCOUNTER — Encounter: Payer: Self-pay | Admitting: Licensed Clinical Social Worker

## 2019-03-01 ENCOUNTER — Ambulatory Visit (INDEPENDENT_AMBULATORY_CARE_PROVIDER_SITE_OTHER): Payer: 59 | Admitting: Licensed Clinical Social Worker

## 2019-03-01 ENCOUNTER — Other Ambulatory Visit: Payer: Self-pay

## 2019-03-01 DIAGNOSIS — F320 Major depressive disorder, single episode, mild: Secondary | ICD-10-CM

## 2019-03-01 DIAGNOSIS — F411 Generalized anxiety disorder: Secondary | ICD-10-CM | POA: Diagnosis not present

## 2019-03-01 NOTE — Progress Notes (Signed)
  Virtual Visit via Telephone Note  I connected with Cindy Mcclure on 03/01/19 at 11:00 AM EDT by telephone and verified that I am speaking with the correct person using two identifiers.   I discussed the limitations, risks, security and privacy concerns of performing an evaluation and management service by telephone and the availability of in person appointments. I also discussed with the patient that there may be a patient responsible charge related to this service. The patient expressed understanding and agreed to proceed.  I discussed the assessment and treatment plan with the patient. The patient was provided an opportunity to ask questions and all were answered. The patient agreed with the plan and demonstrated an understanding of the instructions.   The patient was advised to call back or seek an in-person evaluation if the symptoms worsen or if the condition fails to improve as anticipated.  I provided 30 minutes of non-face-to-face time during this encounter.   Alden Hipp, [name]   THERAPIST PROGRESS NOTE  Session Time: 1100  Participation Level: Active  Behavioral Response: CasualAlertAnxious  Type of Therapy: Individual Therapy  Treatment Goals addressed: Anxiety  Interventions: Supportive  Summary: Cindy Mcclure is a 53 y.o. female who presents with continued symptoms related to her diagnosis. Cindy Mcclure reports doing well since our last session. She reports some anxiety around her daughter's wedding that she attended, "she just didn't pay [place] very much attention. She was talking to the people that she sees every day." [name] encouraged Cindy Mcclure to attempt to come up with alternative thoughts that could explain her daughter's behavior. For instance,[name] asked Cindy Mcclure to come up with an alternative reason that her daughter could have been socializing with other people more at the wedding. Cindy Mcclure had a difficult time coming up with alternative reasons, but [name] was able to  assist with that process.  Cindy Mcclure brought up several issues she is having with her daughter, including her daughter expecting her to "hang up her clothes for her." [name] encouraged Cindy Mcclure to communicate that with her daughter, and to expect her daughter to do things around the house when needed. Cindy Mcclure expressed she has tried to do that, but she feels her daughter does not change her behaviors. [name] encouraged Cindy Mcclure to continue expressing herself, as sometimes teenagers need extra reminders. Cindy Mcclure expressed understanding and agreement. Cindy Mcclure also asked [name] about medications for her back--[name] instructed Cindy Mcclure to check in with her MD regarding any medications as [name] is not able to answer those questions. Cindy Mcclure expressed agreement and understanding with that as well.   Suicidal/Homicidal: No  Therapist Response: Cindy Mcclure continues to work towards her tx goals but has not yet reached them. We will continue to work on emotional regulation skills and improving communication.   Plan: Return again in 4 weeks.  Diagnosis: Axis I: Generalized Anxiety Disorder    Axis II: No diagnosis    Alden Hipp, [name] 03/01/2019

## 2019-03-17 ENCOUNTER — Telehealth: Payer: Self-pay

## 2019-03-17 DIAGNOSIS — F320 Major depressive disorder, single episode, mild: Secondary | ICD-10-CM

## 2019-03-17 MED ORDER — SERTRALINE HCL 25 MG PO TABS: 25.0000 mg | ORAL_TABLET | Freq: Every day | ORAL | 1 refills | Status: DC

## 2019-03-17 NOTE — Telephone Encounter (Signed)
pt called left a message requesting a medication refill on medications.

## 2019-03-17 NOTE — Telephone Encounter (Signed)
[facility]

## 2019-03-18 ENCOUNTER — Encounter
Admission: RE | Admit: 2019-03-18 | Discharge: 2019-03-18 | Disposition: A | Discharge status: Home / Self Care | Payer: 59 | Source: Ambulatory Visit | Attending: Surgery | Admitting: Surgery

## 2019-03-18 ENCOUNTER — Other Ambulatory Visit: Payer: Self-pay

## 2019-03-18 DIAGNOSIS — Z01818 Encounter for other preprocedural examination: Secondary | ICD-10-CM | POA: Diagnosis not present

## 2019-03-18 DIAGNOSIS — Z20828 Contact with and (suspected) exposure to other viral communicable diseases: Secondary | ICD-10-CM | POA: Diagnosis not present

## 2019-03-18 DIAGNOSIS — Z01810 Encounter for preprocedural cardiovascular examination: Secondary | ICD-10-CM | POA: Diagnosis not present

## 2019-03-18 HISTORY — DX: Panic disorder (episodic paroxysmal anxiety): F41.0

## 2019-03-18 HISTORY — DX: Angina pectoris, unspecified: I20.9

## 2019-03-18 HISTORY — DX: Gastro-esophageal reflux disease without esophagitis: K21.9

## 2019-03-18 HISTORY — DX: Personal history of urinary calculi: Z87.442

## 2019-03-18 LAB — BASIC METABOLIC PANEL
Anion gap: 8 (ref 5–15)
BUN: 23 mg/dL — ABNORMAL HIGH (ref 6–20)
CO2: 24 mmol/L (ref 22–32)
Calcium: 8.9 mg/dL (ref 8.9–10.3)
Chloride: 109 mmol/L (ref 98–111)
Creatinine, Ser: 1.37 mg/dL — ABNORMAL HIGH (ref 0.44–1.00)
GFR calc Af Amer: 51 mL/min — ABNORMAL LOW (ref >60)
GFR calc non Af Amer: 44 mL/min — ABNORMAL LOW (ref >60)
Glucose, Bld: 98 mg/dL (ref 70–99)
Potassium: 3.8 mmol/L (ref 3.5–5.1)
Sodium: 141 mmol/L (ref 135–145)

## 2019-03-18 NOTE — Pre-Procedure Instructions (Signed)
NM myocardial perfusion SPECT multiple (stress and rest)04/01/2018 [facility] Result Impression   Normal myocardial perfusion scan no evidence of stress-induced  myocardial ischemia ejection fraction is 76% conclusion negative scan  Other Result Information  This result has an attachment that is not available.  Result Narrative  CARDIOLOGY DEPARTMENT [facility] [address], [place], [place] [address] [phone]  Procedure: Pharmacologic Myocardial Perfusion Imaging  ONE day procedure  Indication: Angina at rest (CMS-HCC) Plan: NM myocardial perfusion SPECT multiple (stress     and rest), ECG stress test only  SOB (shortness of breath) Plan: NM myocardial perfusion SPECT multiple (stress     and rest), ECG stress test only  Ordering Physician:   Dr. Lujean Amel   Clinical History: 53 y.o. year old female recent anginal symptoms Vitals: Height: 63 in Weight: 153 lb Cardiac risk factors include:   UNKOWN    Procedure:  Pharmacologic stress testing was performed with Regadenoson using a single  use 0.4mg /19ml (0.08 mg/ml) prefilled syringe intravenously infused as a  bolus dose. The stress test was stopped due to Infusion completion. Blood  pressure response was normal. The patient did not develop any symptoms  other than fatigue during the procedure.   Rest HR: 80bpm Rest BP: 118/35mmHg Max HR: 117bpm Min BP: 114/48mmHg  Stress Test Administered by: Oswald Hillock, CMA  ECG Interpretation: Rest ECG: normal sinus rhythm, none Stress ECG: normal sinus rhythm,  Recovery ECG: normal sinus rhythm ECG Interpretation: non-diagnostic due to pharmacologic testing.   Administrations This Visit   regadenoson (LEXISCAN) 0.4 mg/5 mL inj syringe 0.4 mg   Admin Date 03/30/2018 [place] Given Dose 0.4 mg Route Intravenous Administered By Herbert Seta, CNMT     technetium Tc58m sestamibi  (CARDIOLITE) injection 38.75 millicurie   Admin Date 03/30/2018 [place] Given Dose 64.33 millicurie Route Intravenous Administered By Herbert Seta, CNMT     technetium Tc62m sestamibi (CARDIOLITE) injection 29.51 millicurie   Admin Date 03/30/2018 Action Given Dose 88.41 millicurie Route Intravenous Administered By Herbert Seta, CNMT       Gated post-stress perfusion imaging was performed 30 minutes after stress.  Rest images were performed 30 minutes after injection.  Gated LV Analysis:  TID Ratio: 0.96  LVEF= 76 %  FINDINGS: Regional wall motion: reveals normal myocardial thickening and wall  motion. The overall quality of the study is good.  Artifacts noted: no Left ventricular cavity: normal.  Perfusion Analysis: SPECT images demonstrate homogeneous tracer  distribution throughout the myocardium.   Status Results Details   Encounter Summary  ECG stress test only8/13/2019 [facility] Result Narrative  This result has an attachment that is not available.  Status Results Details   Encounter Summary

## 2019-03-18 NOTE — Pre-Procedure Instructions (Signed)
CALLED DR Ronelle Nigh REGARDING PT THAT HAS ANGINA AND SEES DR CALLWOOD-LAST SEEN IN AUGUST 2019-STRESS AND ECHO DONE WHICH WERE ULTIMATELY NORMAL. PT NOT SCHEDULED TO SEE CALLWOOD UNTIL AFTER HER SURGERY. LAST EPISODE OF ANGINA WAS 4 MONTHS AGO. DR Ronelle Nigh WANTS PT TO GET CARDIAC CLEARANCE PRIOR TO SHOULDER SURGERY. WILL CONTACT OFFICES ON Monday AS EVERYONE IS CLOSED AT THIS TIME

## 2019-03-18 NOTE — Pre-Procedure Instructions (Signed)
Echo complete8/22/2019 Export Component Name Value Ref Range  LV Ejection Fraction (%) 55   Aortic Valve Regurgitation Grade none   Aortic Valve Stenosis Grade none   Aortic Valve Max Velocity (m/s) 1.1 m/sec  Aortic Valve Stenosis Mean Gradient (mmHg) 2.9 mmHg  Mitral Valve Regurgitation Grade moderate   Mitral Valve Stenosis Grade none   Tricuspid Valve Regurgitation Grade mild   Tricuspid Valve Regurgitation Max Velocity (m/s) 2.6 m/sec  Right Ventricle Systolic Pressure (mmHg) 88.4 mmHg  LV End Diastolic Diameter (cm) 4.6 cm  LV End Systolic Diameter (cm) 3.2 cm  LV Septum Wall Thickness (cm) 0.7 cm  LV Posterior Wall Thickness (cm) 0.67 cm  Left Atrium Diameter (cm) 3.2 cm  Result Narrative              CARDIOLOGY DEPARTMENT         [place], [place]                  [id number]      [facility]              Acct #: [id number]      [place], [place], [place] [address]    Date: 03/30/2018 11: 45 AM                                Adult  Female  Age: 53 yrs      ECHOCARDIOGRAM REPORT               Outpatient                                KC^^[facility]    STUDY:CHEST WALL        TAPE:0000: 00: 0: 00: 00 MD1: Callwood, Dwayne, MD    ECHO:Yes  DOPPLER:Yes    FILE:0000-000-000    BP: 110/62 mmHg    COLOR:Yes  CONTRAST:No   MACHINE:Philips  RV BIOPSY:No     3D:No SOUND QLTY:Moderate      Height: 63 in   MEDIUM:None                       Weight: 153 lb                                BSA: 1.7 m2 _________________________________________________________________________________________        HISTORY: DOE, Chest pain         REASON: Assess, LV function       INDICATION:  I20.8 Other forms of angina pectoris, R06.02 Shortness of breath _________________________________________________________________________________________ ECHOCARDIOGRAPHIC MEASUREMENTS 2D DIMENSIONS AORTA         Values  Normal Range  MAIN PA     Values  Normal Range        Annulus: nm*     [2.1-2.5]     PA Main: nm*    [1.5-2.1]       Aorta Sin: 3.1 cm    [2.7-3.3]  RIGHT VENTRICLE      ST Junction: nm*     [2.3-2.9]     RV Base: nm*    [<4.2]       Asc.Aorta: nm*     [2.3-3.1]     RV Mid: 2.7 cm  [<  3.5] LEFT VENTRICLE                   RV Length: nm*    [<8.6]         LVIDd: 4.6 cm    [3.9-5.3]  INFERIOR VENA CAVA         LVIDs: 3.2 cm            Max. IVC: nm*    [<=2.1]           FS: 28.9 %    [>25]      Min. IVC: nm*          SWT: 0.70 cm   [0.5-0.9]  ------------------          PWT: 0.67 cm   [0.5-0.9]  nm* - not measured LEFT ATRIUM        LA Diam: 3.2 cm    [2.7-3.8]      LA A4C Area: nm*     [<20]       LA Volume: nm*     [22-52] _________________________________________________________________________________________ ECHOCARDIOGRAPHIC DESCRIPTIONS AORTIC ROOT          Size: Normal       Dissection: INDETERM FOR DISSECTION AORTIC VALVE        Leaflets: Tricuspid          Morphology: Normal        Mobility: Fully mobile LEFT VENTRICLE          Size: Normal            Anterior: Normal      Contraction: Normal             Lateral: Normal       Closest EF: >55% (Estimated)        Septal: Normal       LV Masses: No Masses            Apical: Normal          LVH: None             Inferior: Normal                            Posterior: Normal      Dias.FxClass: (Grade 1) relaxation abnormal, E/A reversal MITRAL VALVE        Leaflets: Normal            Mobility: Fully mobile       Morphology: Normal LEFT ATRIUM          Size: Normal            LA Masses: No masses       IA Septum: Normal IAS MAIN PA          Size: Normal PULMONIC VALVE       Morphology: Normal            Mobility: Fully mobile RIGHT VENTRICLE       RV Masses: No Masses             Size: Normal       Free Wall: Normal           Contraction: Normal TRICUSPID VALVE        Leaflets: Normal            Mobility: Fully mobile       Morphology: Normal RIGHT ATRIUM          Size: Normal  RA Other: None        RA Mass: No masses PERICARDIUM         Fluid: No effusion INFERIOR VENACAVA          Size: Normal Normal respiratory collapse _________________________________________________________________________________________  DOPPLER ECHO and OTHER SPECIAL PROCEDURES         Aortic: No AR           No AS             112.9 cm/sec peak vel   5.1 mmHg peak grad             2.9 mmHg mean grad     2.2 cm^2 by DOPPLER         Mitral: MODERATE MR        No MS             MV Inflow E Vel = 89.7 cm/sec   MV Annulus E'Vel = 5.8 cm/sec             E/E'Ratio = 15.5       Tricuspid: MILD TR          No TS             257.9 cm/sec peak TR vel  31.6 mmHg peak RV pressure       Pulmonary: TRIVIAL PR         No PS _________________________________________________________________________________________ INTERPRETATION NORMAL LEFT VENTRICULAR SYSTOLIC FUNCTION WITH AN ESTIMATED EF = >55 % NORMAL RIGHT VENTRICULAR SYSTOLIC FUNCTION MILD-TO-MODERATE  MITRAL VALVE INSUFFICIENCY MILD TRICUSPID VALVE INSUFFICIENCY NO VALVULAR STENOSIS _________________________________________________________________________________________ Electronically signed by  Lujean Amel, MD on 04/08/2018 01: 02 PM      Performed By: Cephus Shelling, RVT   Ordering Physician: Lujean Amel, MD _________________________________________________________________________________________  Other Result Information  Interface, Text Results In - 04/08/2018  1:03 PM EDT                        CARDIOLOGY DEPARTMENT                 [place], [place]                                    [id number]           [place] #: [id number]           [address], [place], [place] [address]       Date: 03/30/2018 11: 67 AM                                                              Adult   Female   Age: 66 yrs           ECHOCARDIOGRAM REPORT                              Outpatient  KC^^KCWC      STUDY:CHEST WALL               TAPE:0000: 00: 0: 00: 00 MD1: Callwood, Dwayne, MD       ECHO:Yes    DOPPLER:Yes       FILE:0000-000-000        BP: 110/62 mmHg      COLOR:Yes   CONTRAST:No     MACHINE:Philips  RV BIOPSY:No          3D:No  SOUND QLTY:Moderate            Height: 63 in     MEDIUM:None                                              Weight: 153 lb                                                              BSA: 1.7 m2 _________________________________________________________________________________________               HISTORY: DOE, Chest pain                REASON: Assess, LV function            INDICATION: I20.8 Other forms of angina pectoris, R06.02 Shortness of breath _________________________________________________________________________________________ ECHOCARDIOGRAPHIC MEASUREMENTS 2D DIMENSIONS AORTA                  Values    Normal Range   MAIN PA         Values    Normal Range               Annulus: nm*          [2.1-2.5]         PA Main: nm*       [1.5-2.1]             Aorta Sin: 3.1 cm       [2.7-3.3]    RIGHT VENTRICLE           ST Junction: nm*          [2.3-2.9]         RV Base: nm*       [<4.2]             Asc.Aorta: nm*          [2.3-3.1]          RV Mid: 2.7 cm    [<3.5] LEFT VENTRICLE                                      RV Length: nm*       [<8.6]                 LVIDd: 4.6 cm       [3.9-5.3]    INFERIOR VENA CAVA                 LVIDs: 3.2 cm  Max. IVC: nm*       [<=2.1]                    FS: 28.9 %       [>25]            Min. IVC: nm*                   SWT: 0.70 cm      [0.5-0.9]    ------------------                   PWT: 0.67 cm      [0.5-0.9]    nm* - not measured LEFT ATRIUM               LA Diam: 3.2 cm       [2.7-3.8]           LA A4C Area: nm*          [<20]             LA Volume: nm*          [22-52] _________________________________________________________________________________________ ECHOCARDIOGRAPHIC DESCRIPTIONS AORTIC ROOT                  Size: Normal            Dissection: INDETERM FOR DISSECTION AORTIC VALVE              Leaflets: Tricuspid                   Morphology: Normal              Mobility: Fully mobile LEFT VENTRICLE                  Size: Normal                        Anterior: Normal           Contraction: Normal                         Lateral: Normal            Closest EF: >55% (Estimated)                Septal: Normal             LV Masses: No Masses                       Apical: Normal                   LVH: None                          Inferior: Normal                                                     Posterior: Normal          Dias.FxClass: (Grade 1) relaxation abnormal, E/A reversal MITRAL VALVE              Leaflets: Normal                        Mobility:  Fully mobile            Morphology: Normal LEFT ATRIUM                   Size: Normal                       LA Masses: No masses             IA Septum: Normal IAS MAIN PA                  Size: Normal PULMONIC VALVE            Morphology: Normal                        Mobility: Fully mobile RIGHT VENTRICLE             RV Masses: No Masses                         Size: Normal             Free Wall: Normal                     Contraction: Normal TRICUSPID VALVE              Leaflets: Normal                        Mobility: Fully mobile            Morphology: Normal RIGHT ATRIUM                  Size: Normal                        RA Other: None               RA Mass: No masses PERICARDIUM                 Fluid: No effusion INFERIOR VENACAVA                  Size: Normal Normal respiratory collapse _________________________________________________________________________________________  DOPPLER ECHO and OTHER SPECIAL PROCEDURES                Aortic: No AR                      No AS                        112.9 cm/sec peak vel      5.1 mmHg peak grad                        2.9 mmHg mean grad         2.2 cm^2 by DOPPLER                Mitral: MODERATE MR                No MS                        MV Inflow E Vel = 89.7 cm/sec     MV Annulus E'Vel = 5.8 cm/sec  E/E'Ratio = 15.5             Tricuspid: MILD TR                    No TS                        257.9 cm/sec peak TR vel   31.6 mmHg peak RV pressure             Pulmonary: TRIVIAL PR                 No PS _________________________________________________________________________________________ INTERPRETATION NORMAL LEFT VENTRICULAR SYSTOLIC FUNCTION WITH AN ESTIMATED EF = >55 % NORMAL RIGHT VENTRICULAR SYSTOLIC FUNCTION MILD-TO-MODERATE MITRAL VALVE INSUFFICIENCY MILD TRICUSPID VALVE INSUFFICIENCY NO VALVULAR STENOSIS _________________________________________________________________________________________ Electronically signed by    Lujean Amel, MD on 04/08/2018 01:  02 PM          Performed By: Cephus Shelling, RVT    Ordering Physician: Lujean Amel, MD _________________________________________________________________________________________  Status Results Details   Encounter Summary

## 2019-03-18 NOTE — Pre-Procedure Instructions (Signed)
Progress Notes - documented in this encounter Jobe Gibbon, MD - 04/07/2018 10:30 AM EDT Formatting of this note might be different from the original. Established Patient Visit   Chief Complaint: Chief Complaint  Patient presents with  . Follow-up Studies  ECHO AND MYOVIEW  Date of Service: 04/07/2018 Date of Birth: 03-Aug-1966 PCP: Scarlette Ar, MD  History of Present Illness: [name] is a 53 y.o.female patient who Presents with symptoms of chest pain possible angina shortness of breath dyspnea on exertion. Patient has had problems with arthritis and frequent falls chronic back pain patient is not able to work right now some disability the patient now underwent noninvasive cardiac studies reasonable remarkable here for routine follow-up. Patient has had a strained relationship with her mother who does not spend much time with her and prefers to spend time with her granddaughter instead  Past Medical and Surgical History  Past Medical History Past Medical History:  Diagnosis Date  . Allergic state  . Arthritis  . Chronic back pain  . Seasonal allergies  . Seizures (CMS-HCC)   Past Surgical History She has a past surgical history that includes Cesarean section; Surgery for seizures 1985; Cholecystectomy; Colonoscopy (09/29/2016); Knee arthroscopy with excision plica, partial synovectomy (Left, 11/27/2016); and knee arthroscopy with medial menisectomy, partia synovectomy (Right, 09/03/2017).   Medications and Allergies  Current Medications  Current Outpatient Medications  Medication Sig Dispense Refill  . ascorbic acid, vitamin C, (VITAMIN C) 1000 MG tablet Take 1,000 mg by mouth once daily.  . biotin 1 mg tablet Take 500 mcg by mouth once daily.  . cloBAZam (ONFI) 20 mg Tab Take 1 tablet (20 mg total) by mouth nightly 30 tablet 5  . cyanocobalamin (VITAMIN B12) 1000 MCG tablet Take 1,000 mcg by mouth once daily  . diclofenac (VOLTAREN) 75 MG EC tablet  Take 1 tablet (75 mg total) by mouth 2 (two) times daily with meals 60 tablet 11  . ERGOCALCIFEROL, VITAMIN D2, (VITAMIN D3 ORAL) Take 1 tablet by mouth once daily.  . flaxseed oil 1,000 mg Cap Take 1,000 mg by mouth once daily  . FOLIC ACID/MV,FE,MIN (CENTRUM ORAL) Take 1 tablet by mouth once daily.  Marland Kitchen lamoTRIgine (LAMICTAL) 150 MG tablet Take 1 tablet (150 mg total) by mouth 2 (two) times daily 60 tablet 11  . levETIRAcetam (KEPPRA XR) 750 mg ER tablet Take 1 tablets (750mg ) twice a day. 60 tablet 11  . loratadine (CLARITIN) 10 mg tablet Take 10 mg by mouth once daily.   Marland Kitchen omeprazole (PRILOSEC) 20 MG DR capsule Take 1 capsule (20 mg total) by mouth once daily 30 capsule 11  . pyridoxine, vitamin B6, (B-6) 500 MG tablet Take 500 mg by mouth once daily  . tiZANidine (ZANAFLEX) 4 MG tablet 1/2-1 po qday prn 30 tablet 11  . traMADol (ULTRAM) 50 mg tablet 1 po qday prn 25 tablet 5  . VITAMIN A ORAL Take 1 tablet by mouth once daily.  Marland Kitchen VITAMIN E ACETATE (VITAMIN E ORAL) Take 1 tablet by mouth once daily.  Marland Kitchen zonisamide (ZONEGRAN) 100 MG capsule Take 4 capsules (400 mg total) by mouth nightly 120 capsule 11   No current facility-administered medications for this visit.   Allergies: Patient has no known allergies.  Social and Family History  Social History reports that she quit smoking about 29 years ago. Her smoking use included cigarettes. She has never used smokeless tobacco. She reports that she drinks alcohol. She reports that she does  not use drugs.  Family History Family History  Problem Relation Age of Onset  . Osteoporosis (Thinning of bones) Mother  . Kidney disease Father  . Osteoporosis (Thinning of bones) Paternal Grandmother   Review of Systems   Review of Systems: The patient denies chest pain, shortness of breath, orthopnea, paroxysmal nocturnal dyspnea, pedal edema, palpitations, heart racing, presyncope, syncope. Review of 12 Systems is negative except as described above.   Physical Examination   Vitals:BP 102/58  Pulse 84  Ht 160 cm (5\' 3" )  Wt 69.7 kg (153 lb 10.6 oz)  SpO2 98%  BMI 27.22 kg/m  Ht:160 cm (5\' 3" ) Wt:69.7 kg (153 lb 10.6 oz) QQP:YPPJ surface area is 1.76 meters squared. Body mass index is 27.22 kg/m.  HEENT: Pupils equally reactive to light and accomodation  Neck: Supple without thyromegaly, carotid pulses 2+ Lungs: clear to auscultation bilaterally; no wheezes, rales, rhonchi Heart: Regular rate and rhythm. No gallops, murmurs or rub Abdomen: soft nontender, nondistended, with normal bowel sounds Extremities: no cyanosis, clubbing, or edema Peripheral Pulses: 2+ in all extremities, 2+ femoral pulses bilaterally  Assessment   53 y.o. female with  1. Angina at rest (CMS-HCC)  2. SOB (shortness of breath)  3. Rotator cuff tendinitis, left  4. Traumatic incomplete tear of left rotator cuff, subsequent encounter  5. Frequent falls  6. DDD (degenerative disc disease), cervical  7. Partial epilepsy with impairment of consciousness, intractable (CMS-HCC)  8. Osteoarthritis of knee, unspecified   Plan  1 Chest pain thought to be angina recent noninvasive cardiac studies were benign recommend conservative medical therapy 2 dyspnea shortness of breath unclear etiology recommend aerobic exercise consider inhalers with consider follow-up with pulmonary symptoms persist 3 frequent falls probably from ataxia poor balance consider physical therapy recommend fall risk training 4 seizure activity follow up with Neurology for treatment and care 5 borderline obesity recommend modest weight loss success portion control 6 depression mild moderate consider antidepressant consider follow-up with primary physician 7 have the patient follow up in 1 year  Return in about 1 year (around 04/08/2019).  DWAYNE Prince Rome, MD  This dictation was prepared with dragon dictation. Any transcription errors that result from this process are unintentional.    Electronically signed by Jobe Gibbon, MD at 04/19/2018 3:12 PM EDT   Plan of Treatment - documented as of this encounter Upcoming Encounters Upcoming Encounters  Date Type Specialty Care Team Description  03/29/2019 PT/OT Office Visit Physical Therapy Moger, Vivia Budge, PT  [place], [place] [address]  [phone]  [phone] (Fax)    04/06/2019 Post Op Orthopaedics Lattie Corns, [place]  [place], [place] [address]  [phone]  [phone] (Fax)    04/13/2019 Office Visit Cardiology Jobe Gibbon, MD  [place], [place] [address]  [phone]  [phone] (Fax)    05/06/2019 Post Op Orthopaedics Poggi, Smith Mince, MD  [place]  [facility] [place], [place] [address]  [phone]  [phone] ([phone]    06/13/2019 Office Visit Physical Medicine and Rehabilitation [place], [place]  [address]  [place], [place] [address]  [phone]  [phone] (Fax)    12/13/2019 Office Visit Neurology Cain Sieve, MD  [facility] [place], [place] [address]  [phone]  [phone] (Fax)     Visit Diagnoses - documented in this encounter Diagnosis  Angina at rest (CMS-HCC) - Primary  Other  and unspecified angina pectoris   SOB (shortness of breath)  Shortness of breath   Rotator cuff tendinitis, left   Traumatic incomplete tear of left rotator cuff, subsequent encounter   Frequent falls   DDD (degenerative disc disease), cervical  Degeneration of cervical intervertebral disc   Partial epilepsy with impairment of consciousness, intractable (CMS-HCC)  Localization-related (focal) (partial) epilepsy and epileptic syndromes with complex partial seizures, with intractable epilepsy   Osteoarthritis of knee, unspecified   Images  Patient Contacts  Contact Name  Contact Address Communication Relationship to Patient  Marilynne [address] [place] [place], [place] [address] [phone] [facility]) Parent  Diane [address] [place], [place] [address] [phone] [facility]) [phone] (Home) Parent, Emergency Contact  Document Information  Primary Care Provider Other Service Providers Document Coverage Dates  Scarlette Ar, MD (Oct. 30, 2012October 30, 2012 - Present) DM: [id number] [phone] (Work) [phone] (Fax) [place], [place] [address] Family Medicine  Aug. 21, 2019August 21, 2019   [place] [address] [place], [place] [address]   Encounter Providers Encounter Date  Dwayne Aida Raider, MD (Attending) DM: [phone] (Work) [phone] (Fax) [place], [place] [address] Cardiovascular Disease Aug. 21, 2019August 21, 2019

## 2019-03-18 NOTE — Patient Instructions (Signed)
Your procedure is scheduled on: 03-22-19 TUESDAY Report to Same Day Surgery 2nd floor medical mall [facility] Entrance-take elevator on left to 2nd floor.  Check in with surgery information desk.) To find out your arrival time please call [phone] between 1PM - 3PM on 03-21-19 MONDAY  Remember: Instructions that are not followed completely may result in serious medical risk, up to and including death, or upon the discretion of your surgeon and anesthesiologist your surgery may need to be rescheduled.    _x___ 1. Do not eat food after midnight the night before your procedure. NO GUM OR CANDY AFTER MIDNIGHT. You may drink clear liquids up to 2 hours before you are scheduled to arrive at the hospital for your procedure.  Do not drink clear liquids within 2 hours of your scheduled arrival to the hospital.  Clear liquids include  --Water or Apple juice without pulp  --Clear carbohydrate beverage such as ClearFast or Gatorade  --Black Coffee or Clear Tea (No milk, no creamers, do not add anything to the coffee or Tea   ____Ensure clear carbohydrate drink on the way to the hospital for bariatric patients  ____Ensure clear carbohydrate drink 3 hours before surgery.     __x__ 2. No Alcohol for 24 hours before or after surgery.   __x__3. No Smoking or e-cigarettes for 24 prior to surgery.  Do not use any chewable tobacco products for at least 6 hour prior to surgery   ____  4. Bring all medications with you on the day of surgery if instructed.    __x__ 5. Notify your doctor if there is any change in your medical condition     (cold, fever, infections).    x___6. On the morning of surgery brush your teeth with toothpaste and water.  You may rinse your mouth with mouth wash if you wish.  Do not swallow any toothpaste or mouthwash.   Do not wear jewelry, make-up, hairpins, clips or nail polish.  Do not wear lotions, powders, or perfumes. You may wear deodorant.  Do not shave 48 hours prior  to surgery. Men may shave face and neck.  Do not bring valuables to the hospital.    [facility] is not responsible for any belongings or valuables.               Contacts, dentures or bridgework may not be worn into surgery.  Leave your suitcase in the car. After surgery it may be brought to your room.  For patients admitted to the hospital, discharge time is determined by your treatment team.  _  Patients discharged the day of surgery will not be allowed to drive home.  You will need someone to drive you home and stay with you the night of your procedure.    Please read over the following fact sheets that you were given:   [facility] Preparing for Surgery   _x___ TAKE THE FOLLOWING MEDICATION THE MORNING OF SURGERY WITH SMALL SIP OF WATER. These include:  1. LAMICTAL (LAMOTRIGENE)  2. KEPPRA (LEVETIRACETAM)  3. TRAMADOL (ULTRAM)  4. PRILOSEC (OMEPRAZOLE)  5. TAKE AN EXTRA OMEPRAZOLE THE NIGHT BEFORE YOUR SURGERY  6.  ____Fleets enema or Magnesium Citrate as directed.   _x___ Use CHG Soap or sage wipes as directed on instruction sheet   ____ Use inhalers on the day of surgery and bring to hospital day of surgery  ____ Stop Metformin and Janumet 2 days prior to surgery.    ____ Take 1/2  of usual insulin dose the night before surgery and none on the morning surgery.   ____ Follow recommendations from Cardiologist, Pulmonologist or PCP regarding  stopping Aspirin, Coumadin, Plavix ,Eliquis, Effient, or Pradaxa, and Pletal.  X____Stop Anti-inflammatories such as Advil, Aleve, Ibuprofen, Motrin, Naproxen, DICLOFENAC (VOLTAREN), Naprosyn, Goodies powders or aspirin products NOW-OK to take Tylenol OR TRAMADOL   _x___ Stop supplements until after surgery-STOP VITAMIN E AND FLAX SEED OIL NOW-MAY RESUME AFTER SURGERY   ____ Bring C-Pap to the hospital.

## 2019-03-19 LAB — SARS CORONAVIRUS 2 (TAT 6-24 HRS): SARS Coronavirus 2: NEGATIVE

## 2019-03-21 NOTE — Pre-Procedure Instructions (Signed)
CALLED OVER TO [facility] WHO SAID CALLWOOD IS OUT OF THE OFFICE TODAY. WANTING TO MAKE SURE THAT THEY RECEIVED THE CLEARANCE-BETSY WILL RELAY THIS INFO TO DR CALLWOODS NURSE

## 2019-03-21 NOTE — Pre-Procedure Instructions (Signed)
DR CALLWOODS NURSE CALLED ME BACK AND SAID THAT DR CALLWOOD IS OUT OF OFFICE TODAY AND WILL BE BACK TOMORROW AND THAT NO ONE ELSE WILL CLEAR PT. I CALLED OVER TO DR POGGIS OFFICE AND LEFT VM FOR TIFFANY AND INFORMED HER THAT PTS SURGERY WILL HAVE TO BE POSTPONED UNTIL SHE GETS HER CARDIAC CLEARANCE

## 2019-03-21 NOTE — Pre-Procedure Instructions (Signed)
CALLED TIFFANY AT DR POGGIS OFFICE AND NOTIFIED HER OF CARDIAC CLEARANCE. FAXED CLEARANCE OVER TO DR CALLWOOD AND TO DR Roland Rack

## 2019-03-22 ENCOUNTER — Encounter
Admission: RE | Disposition: A | Discharge status: Home / Self Care | Payer: Self-pay | Source: Home / Self Care | Attending: Surgery

## 2019-03-22 ENCOUNTER — Ambulatory Visit
Admission: RE | Admit: 2019-03-22 | Discharge: 2019-03-22 | Disposition: A | Discharge status: Home / Self Care | Payer: 59 | Attending: Surgery | Admitting: Surgery

## 2019-03-22 ENCOUNTER — Ambulatory Visit: Payer: 59

## 2019-03-22 ENCOUNTER — Other Ambulatory Visit: Payer: Self-pay

## 2019-03-22 ENCOUNTER — Ambulatory Visit: Payer: 59 | Admitting: Anesthesiology

## 2019-03-22 DIAGNOSIS — I2510 Atherosclerotic heart disease of native coronary artery without angina pectoris: Secondary | ICD-10-CM | POA: Insufficient documentation

## 2019-03-22 DIAGNOSIS — M71312 Other bursal cyst, left shoulder: Secondary | ICD-10-CM | POA: Insufficient documentation

## 2019-03-22 DIAGNOSIS — M1990 Unspecified osteoarthritis, unspecified site: Secondary | ICD-10-CM | POA: Insufficient documentation

## 2019-03-22 DIAGNOSIS — Z791 Long term (current) use of non-steroidal anti-inflammatories (NSAID): Secondary | ICD-10-CM | POA: Diagnosis not present

## 2019-03-22 DIAGNOSIS — Z87891 Personal history of nicotine dependence: Secondary | ICD-10-CM | POA: Insufficient documentation

## 2019-03-22 DIAGNOSIS — X58XXXA Exposure to other specified factors, initial encounter: Secondary | ICD-10-CM | POA: Insufficient documentation

## 2019-03-22 DIAGNOSIS — G709 Myoneural disorder, unspecified: Secondary | ICD-10-CM | POA: Diagnosis not present

## 2019-03-22 DIAGNOSIS — Z79899 Other long term (current) drug therapy: Secondary | ICD-10-CM | POA: Insufficient documentation

## 2019-03-22 DIAGNOSIS — M75112 Incomplete rotator cuff tear or rupture of left shoulder, not specified as traumatic: Secondary | ICD-10-CM | POA: Diagnosis not present

## 2019-03-22 DIAGNOSIS — R569 Unspecified convulsions: Secondary | ICD-10-CM | POA: Insufficient documentation

## 2019-03-22 DIAGNOSIS — I340 Nonrheumatic mitral (valve) insufficiency: Secondary | ICD-10-CM | POA: Insufficient documentation

## 2019-03-22 DIAGNOSIS — K219 Gastro-esophageal reflux disease without esophagitis: Secondary | ICD-10-CM | POA: Insufficient documentation

## 2019-03-22 HISTORY — PX: SUBACROMIAL DECOMPRESSION: SHX5174

## 2019-03-22 HISTORY — PX: SHOULDER ARTHROSCOPY WITH DEBRIDEMENT AND BICEP TENDON REPAIR: SHX5690

## 2019-03-22 SURGERY — SHOULDER ARTHROSCOPY WITH DEBRIDEMENT AND BICEP TENDON REPAIR
Anesthesia: General | Laterality: Left

## 2019-03-22 MED ORDER — PHENYLEPHRINE HCL (PRESSORS) 10 MG/ML IV SOLN
INTRAVENOUS | Status: DC | PRN
  Administered 2019-03-22 (×3): 100 ug via INTRAVENOUS
  Administered 2019-03-22 (×2): 50 ug via INTRAVENOUS

## 2019-03-22 MED ORDER — OXYCODONE HCL 5 MG PO TABS
ORAL_TABLET | ORAL | Status: AC
  Administered 2019-03-22: 5 mg via ORAL
  Filled 2019-03-22: qty 1

## 2019-03-22 MED ORDER — BUPIVACAINE LIPOSOME 1.3 % IJ SUSP
INTRAMUSCULAR | Status: AC
  Filled 2019-03-22: qty 20

## 2019-03-22 MED ORDER — SUGAMMADEX SODIUM 200 MG/2ML IV SOLN
INTRAVENOUS | Status: AC
  Filled 2019-03-22: qty 2

## 2019-03-22 MED ORDER — LACTATED RINGERS IV SOLN
INTRAVENOUS | Status: DC
  Administered 2019-03-22: 12:00:00 via INTRAVENOUS

## 2019-03-22 MED ORDER — ACETAMINOPHEN 10 MG/ML IV SOLN
INTRAVENOUS | Status: AC
  Filled 2019-03-22: qty 100

## 2019-03-22 MED ORDER — ONDANSETRON HCL 4 MG/2ML IJ SOLN
INTRAMUSCULAR | Status: DC | PRN
  Administered 2019-03-22: 4 mg via INTRAVENOUS

## 2019-03-22 MED ORDER — BUPIVACAINE-EPINEPHRINE 0.5% -1:200000 IJ SOLN
INTRAMUSCULAR | Status: DC | PRN
  Administered 2019-03-22: 30 mL

## 2019-03-22 MED ORDER — BUPIVACAINE LIPOSOME 1.3 % IJ SUSP
INTRAMUSCULAR | Status: DC | PRN
  Administered 2019-03-22: 7 mL via PERINEURAL
  Administered 2019-03-22: 13 mL via PERINEURAL

## 2019-03-22 MED ORDER — OXYCODONE HCL 5 MG PO TABS
5.0000 mg | ORAL_TABLET | Freq: Once | ORAL | Status: AC | PRN
  Administered 2019-03-22: 17:00:00 5 mg via ORAL

## 2019-03-22 MED ORDER — DEXAMETHASONE SODIUM PHOSPHATE 10 MG/ML IJ SOLN
INTRAMUSCULAR | Status: DC | PRN
  Administered 2019-03-22: 5 mg via INTRAVENOUS

## 2019-03-22 MED ORDER — FENTANYL CITRATE (PF) 100 MCG/2ML IJ SOLN
50.0000 ug | Freq: Once | INTRAMUSCULAR | Status: AC
  Administered 2019-03-22: 13:00:00 50 ug via INTRAVENOUS

## 2019-03-22 MED ORDER — MIDAZOLAM HCL 2 MG/2ML IJ SOLN
1.0000 mg | Freq: Once | INTRAMUSCULAR | Status: AC
  Administered 2019-03-22: 1 mg via INTRAVENOUS

## 2019-03-22 MED ORDER — PROPOFOL 10 MG/ML IV BOLUS
INTRAVENOUS | Status: AC
  Filled 2019-03-22: qty 40

## 2019-03-22 MED ORDER — FENTANYL CITRATE (PF) 100 MCG/2ML IJ SOLN
INTRAMUSCULAR | Status: DC | PRN
  Administered 2019-03-22: 50 ug via INTRAVENOUS

## 2019-03-22 MED ORDER — DEXAMETHASONE SODIUM PHOSPHATE 10 MG/ML IJ SOLN
INTRAMUSCULAR | Status: AC
  Filled 2019-03-22: qty 1

## 2019-03-22 MED ORDER — OXYCODONE HCL 5 MG PO TABS: 5.0000 mg | ORAL_TABLET | ORAL | 0 refills | Status: DC | PRN

## 2019-03-22 MED ORDER — MIDAZOLAM HCL 2 MG/2ML IJ SOLN
INTRAMUSCULAR | Status: AC
  Administered 2019-03-22: 13:00:00 1 mg via INTRAVENOUS
  Filled 2019-03-22: qty 2

## 2019-03-22 MED ORDER — FENTANYL CITRATE (PF) 100 MCG/2ML IJ SOLN
INTRAMUSCULAR | Status: AC
  Administered 2019-03-22: 13:00:00 50 ug via INTRAVENOUS
  Filled 2019-03-22: qty 2

## 2019-03-22 MED ORDER — LIDOCAINE HCL (PF) 2 % IJ SOLN
INTRAMUSCULAR | Status: AC
  Filled 2019-03-22: qty 10

## 2019-03-22 MED ORDER — MIDAZOLAM HCL 2 MG/2ML IJ SOLN
INTRAMUSCULAR | Status: AC
  Filled 2019-03-22: qty 2

## 2019-03-22 MED ORDER — CEFAZOLIN SODIUM-DEXTROSE 2-4 GM/100ML-% IV SOLN
INTRAVENOUS | Status: AC
  Filled 2019-03-22: qty 100

## 2019-03-22 MED ORDER — FENTANYL CITRATE (PF) 100 MCG/2ML IJ SOLN
INTRAMUSCULAR | Status: AC
  Filled 2019-03-22: qty 2

## 2019-03-22 MED ORDER — ROCURONIUM BROMIDE 100 MG/10ML IV SOLN
INTRAVENOUS | Status: DC | PRN
  Administered 2019-03-22 (×3): 10 mg via INTRAVENOUS
  Administered 2019-03-22: 50 mg via INTRAVENOUS

## 2019-03-22 MED ORDER — LIDOCAINE HCL (CARDIAC) PF 100 MG/5ML IV SOSY
PREFILLED_SYRINGE | INTRAVENOUS | Status: DC | PRN
  Administered 2019-03-22: 60 mg via INTRAVENOUS

## 2019-03-22 MED ORDER — ONDANSETRON HCL 4 MG/2ML IJ SOLN
INTRAMUSCULAR | Status: AC
  Filled 2019-03-22: qty 2

## 2019-03-22 MED ORDER — EPINEPHRINE PF 1 MG/ML IJ SOLN
INTRAMUSCULAR | Status: AC
  Filled 2019-03-22: qty 1

## 2019-03-22 MED ORDER — BUPIVACAINE HCL (PF) 0.5 % IJ SOLN
INTRAMUSCULAR | Status: AC
  Filled 2019-03-22: qty 10

## 2019-03-22 MED ORDER — ROCURONIUM BROMIDE 50 MG/5ML IV SOLN
INTRAVENOUS | Status: AC
  Filled 2019-03-22: qty 1

## 2019-03-22 MED ORDER — BUPIVACAINE-EPINEPHRINE (PF) 0.5% -1:200000 IJ SOLN
INTRAMUSCULAR | Status: AC
  Filled 2019-03-22: qty 30

## 2019-03-22 MED ORDER — CEFAZOLIN SODIUM-DEXTROSE 2-4 GM/100ML-% IV SOLN
2.0000 g | Freq: Once | INTRAVENOUS | Status: AC
  Administered 2019-03-22: 2 g via INTRAVENOUS

## 2019-03-22 MED ORDER — PROPOFOL 10 MG/ML IV BOLUS
INTRAVENOUS | Status: DC | PRN
  Administered 2019-03-22: 150 mg via INTRAVENOUS

## 2019-03-22 MED ORDER — BUPIVACAINE HCL (PF) 0.5 % IJ SOLN
INTRAMUSCULAR | Status: DC | PRN
  Administered 2019-03-22: 7 mL via PERINEURAL
  Administered 2019-03-22: 3 mL via PERINEURAL

## 2019-03-22 MED ORDER — OXYCODONE HCL 5 MG/5ML PO SOLN: 5.0000 mg | Freq: Once | ORAL | Status: AC | PRN

## 2019-03-22 MED ORDER — FENTANYL CITRATE (PF) 100 MCG/2ML IJ SOLN
25.0000 ug | INTRAMUSCULAR | Status: DC | PRN
  Administered 2019-03-22: 25 ug via INTRAVENOUS

## 2019-03-22 MED ORDER — SODIUM CHLORIDE 0.9 % IV SOLN
INTRAVENOUS | Status: DC | PRN
  Administered 2019-03-22: 25 ug/min via INTRAVENOUS

## 2019-03-22 MED ORDER — SUGAMMADEX SODIUM 200 MG/2ML IV SOLN
INTRAVENOUS | Status: DC | PRN
  Administered 2019-03-22: 200 mg via INTRAVENOUS

## 2019-03-22 MED ORDER — ACETAMINOPHEN 10 MG/ML IV SOLN
INTRAVENOUS | Status: DC | PRN
  Administered 2019-03-22: 1000 mg via INTRAVENOUS

## 2019-03-22 MED ORDER — LIDOCAINE HCL (PF) 1 % IJ SOLN
INTRAMUSCULAR | Status: AC
  Filled 2019-03-22: qty 5

## 2019-03-22 SURGICAL SUPPLY — 54 items
ANCH SUT 2 2.9 2 LD TPR NDL (Anchor) ×2 IMPLANT
ANCH SUT 2 2/0 ABS BRD STRL (Anchor) ×2 IMPLANT
ANCH SUT KNTLS STRL SHLDR SYS (Anchor) ×2 IMPLANT
ANCH SUT Q-FX 2.8 (Anchor) ×2 IMPLANT
ANCHOR ALL-SUT Q-FIX 2.8 (Anchor) ×3 IMPLANT
ANCHOR JUGGERKNOT WTAP NDL 2.9 (Anchor) ×2 IMPLANT
ANCHOR SUT QUATTRO KNTLS 4.5 (Anchor) ×2 IMPLANT
ANCHOR SUT W/ ORTHOCORD (Anchor) ×3 IMPLANT
APL PRP STRL LF DISP 70% ISPRP (MISCELLANEOUS) ×2
BIT DRILL JUGRKNT W/NDL BIT2.9 (DRILL) ×1 IMPLANT
BLADE FULL RADIUS 3.5 (BLADE) ×4 IMPLANT
BUR ACROMIONIZER 4.0 (BURR) ×4 IMPLANT
CANNULA SHAVER 8MMX76MM (CANNULA) ×4 IMPLANT
CHLORAPREP W/TINT 26 (MISCELLANEOUS) ×4 IMPLANT
COVER MAYO STAND REUSABLE (DRAPES) ×4 IMPLANT
COVER WAND RF STERILE (DRAPES) ×4 IMPLANT
DRAPE IMP U-DRAPE 54X76 (DRAPES) ×8 IMPLANT
DRAPE SPLIT 6X30 W/TAPE (DRAPES) ×8 IMPLANT
DRILL JUGGERKNOT W/NDL BIT 2.9 (DRILL) ×4
ELECT REM PT RETURN 9FT ADLT (ELECTROSURGICAL) ×4
ELECTRODE REM PT RTRN 9FT ADLT (ELECTROSURGICAL) ×2 IMPLANT
GAUZE SPONGE 4X4 12PLY STRL (GAUZE/BANDAGES/DRESSINGS) ×4 IMPLANT
GAUZE XEROFORM 1X8 LF (GAUZE/BANDAGES/DRESSINGS) ×4 IMPLANT
GLOVE BIO SURGEON STRL SZ7.5 (GLOVE) ×8 IMPLANT
GLOVE BIO SURGEON STRL SZ8 (GLOVE) ×8 IMPLANT
GLOVE BIOGEL PI IND STRL 8 (GLOVE) ×2 IMPLANT
GLOVE BIOGEL PI INDICATOR 8 (GLOVE) ×2
GLOVE INDICATOR 8.0 STRL GRN (GLOVE) ×4 IMPLANT
GOWN STRL REUS W/ TWL LRG LVL3 (GOWN DISPOSABLE) ×3 IMPLANT
GOWN STRL REUS W/ TWL XL LVL3 (GOWN DISPOSABLE) ×2 IMPLANT
GOWN STRL REUS W/TWL LRG LVL3 (GOWN DISPOSABLE) ×8
GOWN STRL REUS W/TWL XL LVL3 (GOWN DISPOSABLE) ×4
GRASPER SUT 15 45D LOW PRO (SUTURE) ×2 IMPLANT
IV LACTATED RINGER IRRG 3000ML (IV SOLUTION) ×4
IV LR IRRIG 3000ML ARTHROMATIC (IV SOLUTION) ×4 IMPLANT
KIT SUTURE 2.8 Q-FIX DISP (MISCELLANEOUS) ×2 IMPLANT
MANIFOLD NEPTUNE II (INSTRUMENTS) ×4 IMPLANT
MASK FACE SPIDER DISP (MASK) ×4 IMPLANT
MAT ABSORB  FLUID 56X50 GRAY (MISCELLANEOUS) ×2
MAT ABSORB FLUID 56X50 GRAY (MISCELLANEOUS) ×2 IMPLANT
PACK ARTHROSCOPY SHOULDER (MISCELLANEOUS) ×4 IMPLANT
PASSER SUT FIRSTPASS SELF (INSTRUMENTS) ×2 IMPLANT
SLING ARM LRG DEEP (SOFTGOODS) ×1 IMPLANT
SLING ULTRA II LG (MISCELLANEOUS) ×2 IMPLANT
STAPLER SKIN PROX 35W (STAPLE) ×4 IMPLANT
STRAP SAFETY 5IN WIDE (MISCELLANEOUS) ×4 IMPLANT
SUT ETHIBOND 0 MO6 C/R (SUTURE) ×4 IMPLANT
SUT VIC AB 2-0 CT1 27 (SUTURE) ×8
SUT VIC AB 2-0 CT1 TAPERPNT 27 (SUTURE) ×4 IMPLANT
TAPE MICROFOAM 4IN (TAPE) ×4 IMPLANT
TUBING ARTHRO INFLOW-ONLY STRL (TUBING) ×4 IMPLANT
TUBING CONNECTING 10 (TUBING) ×3 IMPLANT
TUBING CONNECTING 10' (TUBING) ×1
WAND WEREWOLF FLOW 90D (MISCELLANEOUS) ×4 IMPLANT

## 2019-03-22 NOTE — Anesthesia Procedure Notes (Signed)
Procedure Name: Intubation Date/Time: 03/22/2019 1:59 PM Performed by: Lowry Bowl, CRNA Pre-anesthesia Checklist: Patient identified, Emergency Drugs available, Suction available and Patient being monitored Patient Re-evaluated:Patient Re-evaluated prior to induction Oxygen Delivery Method: Circle system utilized Preoxygenation: Pre-oxygenation with 100% oxygen Induction Type: IV induction Ventilation: Mask ventilation without difficulty Laryngoscope Size: Mac and 3 Grade View: Grade I Tube type: Oral Tube size: 7.0 mm Number of attempts: 1 Airway Equipment and Method: Stylet Placement Confirmation: ETT inserted through vocal cords under direct vision,  positive ETCO2 and breath sounds checked- equal and bilateral Secured at: 21 cm Tube secured with: Tape Dental Injury: Teeth and Oropharynx as per pre-operative assessment

## 2019-03-22 NOTE — Anesthesia Preprocedure Evaluation (Addendum)
Anesthesia Evaluation  Patient identified by MRN, date of birth, ID band Patient awake    Reviewed: Allergy & Precautions, H&P , NPO status , Patient's Chart, lab work & pertinent test results  History of Anesthesia Complications Negative for: history of anesthetic complications  Airway Mallampati: III  TM Distance: >3 FB Neck ROM: full    Dental  (+) Chipped, Poor Dentition   Pulmonary neg shortness of breath, former smoker,           Cardiovascular + angina + CAD and + DOE       Neuro/Psych Seizures -, Well Controlled,  PSYCHIATRIC DISORDERS  Neuromuscular disease negative psych ROS   GI/Hepatic Neg liver ROS, GERD  Medicated and Controlled,  Endo/Other  negative endocrine ROS  Renal/GU      Musculoskeletal  (+) Arthritis ,   Abdominal   Peds  Hematology negative hematology ROS (+)   Anesthesia Other Findings Past Medical History: No date: Allergic rhinitis No date: Anginal pain [facility])     Comment:  sees dr Clayborn Bigness No date: Anxiety No date: Arthritis     Comment:  all over...knees, ankles, back No date: Back pain No date: Cervical radiculitis     Comment:  no surgery, just cortisone shots.   No date: Constipation No date: Constipation No date: DDD (degenerative disc disease), cervical 2019: Dermatophytosis of nail     Comment:  both feet No date: GERD (gastroesophageal reflux disease) No date: History of kidney stones     Comment:  h/o No date: Osteoarthritis No date: Panic attacks No date: Plantar fasciitis     Comment:  bilaterally 1972: Seizures ([place])     Comment:  unsure of when she had last seizure due to petit mal               type. does not know what brings on a seizure. No date: Stress bladder incontinence, female No date: Urinary incontinence  Past Surgical History: 2002,1994: CESAREAN SECTION 12/06/2015: CHOLECYSTECTOMY; N/A     Comment:  Procedure: LAPAROSCOPIC CHOLECYSTECTOMY WITH                INTRAOPERATIVE CHOLANGIOGRAM;  Surgeon: Dia Crawford III,               MD;  Location: [facility] ORS;  Service: General;  Laterality:               N/A; 09/29/2016: COLONOSCOPY WITH PROPOFOL; N/A     Comment:  Procedure: COLONOSCOPY WITH PROPOFOL;  Surgeon: Manya Silvas, MD;  Location: [facility] ENDOSCOPY;  Service:               Endoscopy;  Laterality: N/A; 11/27/2016: KNEE ARTHROSCOPY WITH EXCISION PLICA; Left     Comment:  Procedure: KNEE ARTHROSCOPY WITH EXCISION PLICA, PARTIAL              SYNOVECTOMY;  Surgeon: Hessie Knows, MD;  Location: [facility]               ORS;  Service: Orthopedics;  Laterality: Left; 09/03/2017: KNEE ARTHROSCOPY WITH MEDIAL MENISECTOMY; Right     Comment:  Procedure: KNEE ARTHROSCOPY WITH MEDIAL MENISECTOMY,               PARTIAL SYNOVECTOMY;  Surgeon: Hessie Knows, MD;                Location: [facility] ORS;  Service: Orthopedics;  Laterality:  Right; No date: SURGERY FOR SEIZURES     Comment:  nothing implanted in head. she was 53 years old. done at              [place] 2003: TUBAL LIGATION  BMI    Body Mass Index: 32.37 kg/m      Reproductive/Obstetrics negative OB ROS                             Anesthesia Physical Anesthesia Plan  ASA: IV  Anesthesia Plan: General ETT   Post-op Pain Management: GA combined w/ Regional for post-op pain   Induction: Intravenous  PONV Risk Score and Plan: Ondansetron, Dexamethasone, Midazolam and Treatment may vary due to age or medical condition  Airway Management Planned: Oral ETT  Additional Equipment:   Intra-op Plan:   Post-operative Plan: Extubation in OR  Informed Consent: I have reviewed the patients History and Physical, chart, labs and discussed the procedure including the risks, benefits and alternatives for the proposed anesthesia with the patient or authorized representative who has indicated his/her understanding and acceptance.     Dental  Advisory Given  Plan Discussed with: Anesthesiologist, CRNA and Surgeon  Anesthesia Plan Comments: (Patient has cardiac clearance for this procedure.   Patient informed that they are higher risk for complications from anesthesia during this procedure due to their medical history.  Patient voiced understanding.  Patient consented for risks of anesthesia including but not limited to:  - adverse reactions to medications - damage to teeth, lips or other oral mucosa - sore throat or hoarseness - Damage to heart, brain, lungs or loss of life  Patient voiced understanding.)       Anesthesia Quick Evaluation

## 2019-03-22 NOTE — Transfer of Care (Signed)
Immediate Anesthesia Transfer of Care Note  Patient: Cindy Mcclure  Procedure(s) Performed: SHOULDER ARTHROSCOPY WITH DEBRIDEMENT, ROTATOR CUFF REPAIR, AND BICEP TENDON REPAIR (Left )  Patient Location: PACU  Anesthesia Type:General  Level of Consciousness: drowsy  Airway & Oxygen Therapy: Patient Spontanous Breathing and Patient connected to face mask oxygen  Post-op Assessment: Report given to RN and Post -op Vital signs reviewed and stable  Post vital signs: stable  Last Vitals:  Vitals Value Taken Time  BP 121/75 03/22/19 1555  Temp 36.4 C 03/22/19 1555  Pulse 78 03/22/19 1600  Resp 12 03/22/19 1600  SpO2 99 % 03/22/19 1600  Vitals shown include unvalidated device data.  Last Pain:  Vitals:   03/22/19 1555  PainSc: Asleep         Complications: No apparent anesthesia complications

## 2019-03-22 NOTE — Anesthesia Procedure Notes (Signed)
Anesthesia Regional Block: Interscalene brachial plexus block   Pre-Anesthetic Checklist: ,, timeout performed, Correct Patient, Correct Site, Correct Laterality, Correct Procedure, Correct Position, site marked, Risks and benefits discussed,  Surgical consent,  Pre-op evaluation,  At surgeon's request and post-op pain management  Laterality: Upper and Left  Prep: chloraprep       Needles:  Injection technique: Single-shot  Needle Type: Stimiplex     Needle Length: 5cm  Needle Gauge: 22     Additional Needles:   Procedures:,,,, ultrasound used (permanent image in chart),,,,  Narrative:  Start time: 03/22/2019 12:55 PM End time: 03/22/2019 1:01 PM Injection made incrementally with aspirations every 5 mL.  Performed by: Personally  Anesthesiologist: Zareth Rippetoe, Precious Haws, MD  Additional Notes: Patient consented for risk and benefits of nerve block including but not limited to nerve damage, failed block, bleeding and infection.  Patient voiced understanding.  Functioning IV was confirmed and monitors were applied.  A 73mm 22ga Stimuplex needle was used. Sterile prep,hand hygiene and sterile gloves were used.  Minimal sedation used for procedure.  No paresthesia endorsed by patient during the procedure.  Negative aspiration and negative test dose prior to incremental administration of local anesthetic. The patient tolerated the procedure well with no immediate complications.

## 2019-03-22 NOTE — H&P (Signed)
Paper H&P to be scanned into permanent record. H&P reviewed and patient re-examined. No changes. 

## 2019-03-22 NOTE — Discharge Instructions (Addendum)
Orthopedic discharge instructions: Keep dressing dry and intact.  May shower after dressing changed on post-op day #4 (Saturday).  Cover staples with Band-Aids after drying off. Apply ice frequently to shoulder. Take Diclofenac 75 mg BID OR ibuprofen 600-800 mg TID with meals for 7-10 days, then as necessary. Take oxycodone as prescribed when needed.  May supplement with ES Tylenol if necessary. Keep shoulder immobilizer on at all times except may remove for bathing purposes. Follow-up in 10-14 days or as scheduled.   AMBULATORY SURGERY  DISCHARGE INSTRUCTIONS   1) The drugs that you were given will stay in your system until tomorrow so for the next 24 hours you should not:  A) Drive an automobile B) Make any legal decisions C) Drink any alcoholic beverage  2) You may resume regular meals tomorrow.  Today it is better to start with liquids and gradually work up to solid foods. You may eat anything you prefer, but it is better to start with liquids, then soup and crackers, and gradually work up to solid foods.  3) Please notify your doctor immediately if you have any unusual bleeding, trouble breathing, redness and pain at the surgery site, drainage, fever, or pain not relieved by medication.  4) Additional Instructions:   Please contact your physician with any problems or Same Day Surgery at [phone], Monday through Friday 6 am to 4 pm, or [place] at [facility] number at [phone].        Interscalene Nerve Block with Exparel  1.  For your surgery you have received an Interscalene Nerve Block with Exparel. 2. Nerve Blocks affect many types of nerves, including nerves that control movement, pain and normal sensation.  You may experience feelings such as numbness, tingling, heaviness, weakness or the inability to move your arm or the feeling or sensation that your arm has "fallen asleep". 3. A nerve block with Exparel can last up to 5 days.  Usually the weakness  wears off first.  The tingling and heaviness usually wear off next.  Finally you may start to notice pain.  Keep in mind that this may occur in any order.  Once a nerve block starts to wear off it is usually completely gone within 60 minutes. 4. ISNB may cause mild shortness of breath, a hoarse voice, blurry vision, unequal pupils, or drooping of the face on the same side as the nerve block.  These symptoms will usually resolve with the numbness.  Very rarely the procedure itself can cause mild seizures. 5. If needed, your surgeon will give you a prescription for pain medication.  It will take about 60 minutes for the oral pain medication to become fully effective.  So, it is recommended that you start taking this medication before the nerve block first begins to wear off, or when you first begin to feel discomfort. 6. Take your pain medication only as prescribed.  Pain medication can cause sedation and decrease your breathing if you take more than you need for the level of pain that you have. 7. Nausea is a common side effect of many pain medications.  You may want to eat something before taking your pain medicine to prevent nausea. 8. After an Interscalene nerve block, you cannot feel pain, pressure or extremes in temperature in the effected arm.  Because your arm is numb it is at an increased risk for injury.  To decrease the possibility of injury, please practice the following:  a. While you are awake change the position  of your arm frequently to prevent too much pressure on any one area for prolonged periods of time. b.  If you have a cast or tight dressing, check the color or your fingers every couple of hours.  Call your surgeon with the appearance of any discoloration (white or blue). c. If you are given a sling to wear before you go home, please wear it  at all times until the block has completely worn off.  Do not get up at night without your sling. d. Please contact [facility] or your  surgeon if you do not begin to regain sensation after 7 days from the surgery.  Anesthesia may be contacted by calling the Same Day Surgery Department, Mon. through Fri., 6 am to 4 pm at [phone].   e. If you experience any other problems or concerns, please contact your surgeon's office. f. If you experience severe or prolonged shortness of breath go to the nearest emergency department.

## 2019-03-22 NOTE — Anesthesia Post-op Follow-up Note (Signed)
Anesthesia QCDR form completed.        

## 2019-03-22 NOTE — Op Note (Signed)
03/22/2019  4:05 PM  Patient:   Cindy Mcclure  Pre-Op Diagnosis:   Traumatic partial-thickness rotator cuff tear, left shoulder.  Post-Op Diagnosis:   Partial-thickness rotator cuff tears of supraspinatus and subscapularis tendons, benign-appearing soft tissue mass of rotator interval, and biceps tendinopathy, left shoulder.  Procedure:   Limited arthroscopic debridement, arthroscopic repair of partial-thickness subscapularis tendon tear, arthroscopic subacromial decompression, mini-open rotator cuff repair with excision of benign-appearing soft tissue mass of rotator interval, and mini-open biceps tenodesis, left shoulder.  Anesthesia:   General endotracheal with interscalene block using Exparel placed preoperatively by the anesthesiologist.  Surgeon:   Pascal Lux, MD  Assistant:   Cameron Proud, PA-C; Freddie Apley, PA-S  Findings:   As above. There was an articular sided partial-thickness tear involving the superior insertional fibers of the subscapularis tendon, as well as a small full-thickness tear of the anterior insertional fibers of the supraspinatus tendon. The remainder the rotator cuff was in satisfactory condition. There was a benign appearing soft tissue mass in involving the bursal side of the rotator interval measuring approximately 10 x 12 mm which was excised and sent to Pathology. The biceps tendon demonstrated moderate inflammatory changes but no partial-thickness tearing. There was a type I tear involving the superior portion of the labrum without frank detachment from the glenoid. The articular surfaces of the humeral head and glenoid surfaces were in satisfactory condition.  Complications:   None  Fluids:   1000 cc  Estimated blood loss:   10 cc  Tourniquet time:   None  Drains:   None  Closure:   Staples      Brief clinical note:   The patient is a 53 year old female with a several year history of left shoulder pain. The patient's symptoms have progressed  despite medications, activity modification, etc. The patient's history and examination are consistent with impingement/tendinopathy with a rotator cuff tear. These findings were confirmed by MRI scan. The patient presents at this time for definitive management of these shoulder symptoms.  Procedure:   The patient underwent placement of an interscalene block using Exparel by the anesthesiologist in the preoperative holding area before being brought into the operating room and lain in the supine position. The patient then underwent general endotracheal intubation and anesthesia before being repositioned in the beach chair position using the beach chair positioner. The left shoulder and upper extremity were prepped with ChloraPrep solution before being draped sterilely. Preoperative antibiotics were administered. A timeout was performed to confirm the proper surgical site before the expected portal sites and incision site were injected with 0.5% Sensorcaine with epinephrine. A posterior portal was created and the glenohumeral joint thoroughly inspected with the findings as described above. An anterior portal was created using an outside-in technique. The labrum and rotator cuff were further probed, again confirming the above-noted findings. The areas of labral fraying were debrided back to stable margins using the full-radius resector, as were the frayed margins of the rotator cuff tears. Areas of synovitis anteriorly also were debrided using the full-radius resector. The ArthroCare wand was inserted and used to release the biceps from its labral anchor. It also was used to obtain hemostasis as well as to "anneal" the labrum superiorly and anteriorly. The instruments were removed from the joint after suctioning the excess fluid.  The camera was repositioned through the posterior portal into the subacromial space. A separate lateral portal was created using an outside-in technique. The 3.5 mm full-radius resector  was introduced and used to  perform a subtotal bursectomy. The ArthroCare wand was then inserted and used to remove the periosteal tissue off the undersurface of the anterior third of the acromion as well as to recess the coracoacromial ligament from its attachment along the anterior and lateral margins of the acromion. The 4.0 mm acromionizing bur was introduced and used to complete the decompression by removing the undersurface of the anterior third of the acromion. The full radius resector was reintroduced to remove any residual bony debris before the ArthroCare wand was reintroduced to obtain hemostasis. The instruments were then removed from the subacromial space after suctioning the excess fluid.  An approximately 4-5 cm incision was made over the anterolateral aspect of the shoulder beginning at the anterolateral corner of the acromion and extending distally in line with the bicipital groove. This incision was carried down through the subcutaneous tissues to expose the deltoid fascia. The raphae between the anterior and middle thirds was identified and this plane developed to provide access into the subacromial space. Additional bursal tissues were debrided sharply using Metzenbaum scissors. The rotator cuff tear was readily identified, as was an unusual benign-appearing firm soft tissue mass measuring approximately 10 x 12 mm and localized over the rotator interval at the rotator cuff margin. This mass was excised sharply with a #15 blade and sent to Pathology for further evaluation.   The exposed greater tuberosity roughened with a rongeur. The tear was repaired using a single [employer] 2.8 mm Q-Fix anchor. These sutures were then brought back laterally and secured using one [place] QuatroLink anchor to create a two-layer closure. An apparent watertight closure was obtained.  The bicipital groove was identified by palpation and opened for 1-1.5 cm. The biceps tendon stump was retrieved through  this defect. The floor of the bicipital groove was roughened with a curet before a Biomet 2.9 mm JuggerKnot anchor was inserted. Both sets of sutures were passed through the biceps tendon and tied securely to effect the tenodesis. The bicipital sheath was reapproximated using two #0 Ethibond interrupted sutures, incorporating the biceps tendon to further reinforce the tenodesis.  The wound was copiously irrigated with sterile saline solution before the deltoid raphae was reapproximated using 2-0 Vicryl interrupted sutures. The subcutaneous tissues were closed in two layers using 2-0 Vicryl interrupted sutures before the skin was closed using staples. The portal sites also were closed using staples. A sterile bulky dressing was applied to the shoulder before the arm was placed into a shoulder immobilizer. The patient was then awakened, extubated, and returned to the recovery room in satisfactory condition after tolerating the procedure well.

## 2019-03-22 NOTE — Progress Notes (Signed)
Pt in PACU reports 5 out of 10 pain. Offered oxycodone 5mg  IR per order. Pt declined pain medication, stating she may want it later before leaving. Pt transported to post op and hand off report given to Dalene Seltzer, Therapist, sports. Oxycodone pill handed to Dalene Seltzer, Therapist, sports. Garfield Cornea, RN, also aware. Pt up to chair and offered pain medication and a snack by post op RN. Nikki, Therapist, sports, states if patient declines oxycodone 5mg , she will return pill to the pyxis.

## 2019-03-23 ENCOUNTER — Encounter: Payer: Self-pay | Admitting: Surgery

## 2019-03-23 DIAGNOSIS — D2112 Benign neoplasm of connective and other soft tissue of left upper limb, including shoulder: Secondary | ICD-10-CM | POA: Insufficient documentation

## 2019-03-23 DIAGNOSIS — M7522 Bicipital tendinitis, left shoulder: Secondary | ICD-10-CM | POA: Insufficient documentation

## 2019-03-23 NOTE — Anesthesia Postprocedure Evaluation (Signed)
Anesthesia Post Note  Patient: Cindy Mcclure  Procedure(s) Performed: SHOULDER ARTHROSCOPY WITH DEBRIDEMENT, ROTATOR CUFF REPAIR, AND BICEP TENDON REPAIR (Left ) SUBACROMIAL DECOMPRESSION  Patient location during evaluation: PACU Anesthesia Type: General Level of consciousness: awake and alert Pain management: pain level controlled Vital Signs Assessment: post-procedure vital signs reviewed and stable Respiratory status: spontaneous breathing, nonlabored ventilation, respiratory function stable and patient connected to nasal cannula oxygen Cardiovascular status: blood pressure returned to baseline and stable Postop Assessment: no apparent nausea or vomiting Anesthetic complications: no     Last Vitals:  Vitals:   03/22/19 1707 03/22/19 1743  BP: 128/75 130/79  Pulse: 79 81  Resp: 18 16  Temp: 36.4 C   SpO2: 97% 98%    Last Pain:  Vitals:   03/22/19 1743  TempSrc:   PainSc: 5                  Precious Haws [place]

## 2019-03-24 LAB — SURGICAL PATHOLOGY

## 2019-03-28 ENCOUNTER — Encounter: Payer: Self-pay | Admitting: Surgery

## 2019-04-04 ENCOUNTER — Ambulatory Visit (INDEPENDENT_AMBULATORY_CARE_PROVIDER_SITE_OTHER): Payer: 59 | Admitting: Licensed Clinical Social Worker

## 2019-04-04 ENCOUNTER — Encounter: Payer: Self-pay | Admitting: Licensed Clinical Social Worker

## 2019-04-04 ENCOUNTER — Other Ambulatory Visit: Payer: Self-pay

## 2019-04-04 DIAGNOSIS — F411 Generalized anxiety disorder: Secondary | ICD-10-CM

## 2019-04-04 DIAGNOSIS — F320 Major depressive disorder, single episode, mild: Secondary | ICD-10-CM

## 2019-04-04 NOTE — Progress Notes (Signed)
Virtual Visit via Telephone Note  I connected with Cindy Mcclure on 04/04/19 at 11:00 AM EDT by telephone and verified that I am speaking with the correct person using two identifiers.   I discussed the limitations, risks, security and privacy concerns of performing an evaluation and management service by telephone and the availability of in person appointments. I also discussed with the patient that there may be a patient responsible charge related to this service. The patient expressed understanding and agreed to proceed.  I discussed the assessment and treatment plan with the patient. The patient was provided an opportunity to ask questions and all were answered. The patient agreed with the plan and demonstrated an understanding of the instructions.   The patient was advised to call back or seek an in-person evaluation if the symptoms worsen or if the condition fails to improve as anticipated.  I provided 45 minutes of non-face-to-face time during this encounter.   Cindy Hipp, [name]    THERAPIST PROGRESS NOTE  Session Time: 1100  Participation Level: Active  Behavioral Response: CasualAlertIrritable  Type of Therapy: Individual Therapy  Treatment Goals addressed: Coping  Interventions: Assertiveness Training  Summary: Cindy Mcclure is a 53 y.o. female who presents with continued symptoms related to her diagnosis. Cindy Mcclure reports doing, "not so well," since our last session. She reports having rotator cuff surgery since our last session, and is currently recovering from that. She stated physical therapy has been difficult but she has continued going.  She reports ongoing problems in her relationship with her mother and daughter, "my mom doesn't think I need any help during recovering from surgery." [name] asked Cindy Mcclure if she had discussed this concern with her mother. Cindy Mcclure at one point stated she did discuss it with her mother, but at another part in the conversation  stated she did not ask her mother for help. Cindy Mcclure provided conflicting information at various parts throughout the session, and was unable to clarify when asked by [name]. [name] encouraged Cindy Mcclure to discuss her needs with her mother and how her mother is best able to help throughout her recovery time. Cindy Mcclure expressed agreement. Cindy Mcclure also stated her daughter has not helped her with any chores throughout her recovery either, "I ask her to but she just won't." [name] encouraged Cindy Mcclure to begin setting firm boundaries, and taking things away if needed. Cindy Mcclure expressed concern about taking stuff away, but was in agreement with setting more firm boundaries.   Suicidal/Homicidal: No  Therapist Response: Cindy Mcclure continues to work towards her tx goals but has not yet reached them. We will continue to work on emotional regulation skills and improving communication.   Plan: Return again in 4 weeks.  Diagnosis: Axis I: Generalized Anxiety Disorder    Axis II: No diagnosis    Cindy Hipp, [name] 04/04/2019

## 2019-04-20 ENCOUNTER — Encounter: Payer: Self-pay | Admitting: Psychiatry

## 2019-04-20 ENCOUNTER — Ambulatory Visit (INDEPENDENT_AMBULATORY_CARE_PROVIDER_SITE_OTHER): Payer: 59 | Admitting: Psychiatry

## 2019-04-20 ENCOUNTER — Other Ambulatory Visit: Payer: Self-pay

## 2019-04-20 DIAGNOSIS — F320 Major depressive disorder, single episode, mild: Secondary | ICD-10-CM

## 2019-04-20 DIAGNOSIS — F411 Generalized anxiety disorder: Secondary | ICD-10-CM | POA: Diagnosis not present

## 2019-04-20 MED ORDER — SERTRALINE HCL 50 MG PO TABS: 50.0000 mg | ORAL_TABLET | Freq: Every day | ORAL | 1 refills | Status: DC

## 2019-04-20 NOTE — Progress Notes (Signed)
Virtual Visit via Video Note  I connected with Cindy Mcclure on 04/20/19 at 10:15 AM EDT by a video enabled telemedicine application and verified that I am speaking with the correct person using two identifiers.   I discussed the limitations of evaluation and management by telemedicine and the availability of in person appointments. The patient expressed understanding and agreed to proceed.  I discussed the assessment and treatment plan with the patient. The patient was provided an opportunity to ask questions and all were answered. The patient agreed with the plan and demonstrated an understanding of the instructions.   The patient was advised to call back or seek an in-person evaluation if the symptoms worsen or if the condition fails to improve as anticipated.   [facility] MD OP Progress Note  04/20/2019 11:12 AM Cindy Mcclure  MRN:  [id number]  Chief Complaint:  Chief Complaint    Follow-up     HPI: Cindy Mcclure is a 53 year old Caucasian female, on disability, lives in [place], has a history of MDD, GAD, seizure disorder, urinary incontinence, degenerative disc disease, was evaluated by telemedicine today.  Patient reports she continues to struggle with some anxiety and depressive symptoms more so because of her relationship problems with her daughter and her mother.  She reports she continues to work with her therapist.  She is also currently recovering from a rotator cuff surgery.  She reports she does have pain on and off however is currently on pain medications.  She is tolerating the Zoloft well.  She is interested in dosage increase.  Will increase Zoloft to 50 mg p.o. daily.  She denies any suicidality, homicidality or perceptual disturbances.  She reports sleep as restless on and off due to pain however overall she is doing okay.  She denies any other concerns today. Visit Diagnosis:    ICD-10-CM   1. MDD (major depressive disorder), single episode, mild (HCC)  F32.0  sertraline (ZOLOFT) 50 MG tablet  2. GAD (generalized anxiety disorder)  F41.1 sertraline (ZOLOFT) 50 MG tablet    Past Psychiatric History: I have reviewed past psychiatric history from my progress note on 11/04/2018  Past Medical History:  Past Medical History:  Diagnosis Date  . Allergic rhinitis   . Anginal pain [facility])    sees dr Clayborn Bigness  . Anxiety   . Arthritis    all over...knees, ankles, back  . Back pain   . Cervical radiculitis    no surgery, just cortisone shots.    . Constipation   . Constipation   . DDD (degenerative disc disease), cervical   . Dermatophytosis of nail 2019   both feet  . GERD (gastroesophageal reflux disease)   . History of kidney stones    h/o  . Osteoarthritis   . Panic attacks   . Plantar fasciitis    bilaterally  . Seizures ([place]) 1972   unsure of when she had last seizure due to petit mal type. does not know what brings on a seizure.  . Stress bladder incontinence, female   . Urinary incontinence     Past Surgical History:  Procedure Laterality Date  . CESAREAN SECTION  [id number]  . CHOLECYSTECTOMY N/A 12/06/2015   Procedure: LAPAROSCOPIC CHOLECYSTECTOMY WITH INTRAOPERATIVE CHOLANGIOGRAM;  Surgeon: Dia Crawford III, MD;  Location: [facility] ORS;  Service: General;  Laterality: N/A;  . COLONOSCOPY WITH PROPOFOL N/A 09/29/2016   Procedure: COLONOSCOPY WITH PROPOFOL;  Surgeon: Manya Silvas, MD;  Location: [facility];  Service: Endoscopy;  Laterality: N/A;  .  KNEE ARTHROSCOPY WITH EXCISION PLICA Left [id number]   Procedure: KNEE ARTHROSCOPY WITH EXCISION PLICA, PARTIAL SYNOVECTOMY;  Surgeon: Hessie Knows, MD;  Location: [facility] ORS;  Service: Orthopedics;  Laterality: Left;  . KNEE ARTHROSCOPY WITH MEDIAL MENISECTOMY Right 09/03/2017   Procedure: KNEE ARTHROSCOPY WITH MEDIAL MENISECTOMY, PARTIAL SYNOVECTOMY;  Surgeon: Hessie Knows, MD;  Location: [facility] ORS;  Service: Orthopedics;  Laterality: Right;  . SHOULDER ARTHROSCOPY WITH DEBRIDEMENT AND BICEP  TENDON REPAIR Left 03/22/2019   Procedure: SHOULDER ARTHROSCOPY WITH DEBRIDEMENT, ROTATOR CUFF REPAIR, AND BICEP TENDON REPAIR;  Surgeon: Corky Mull, MD;  Location: [facility] ORS;  Service: Orthopedics;  Laterality: Left;  . SUBACROMIAL DECOMPRESSION  03/22/2019   Procedure: SUBACROMIAL DECOMPRESSION;  Surgeon: Corky Mull, MD;  Location: [facility] ORS;  Service: Orthopedics;;  . SURGERY FOR SEIZURES     nothing implanted in head. she was 53 years old. done [facility]  . TUBAL LIGATION  2003    Family Psychiatric History: I have reviewed family psychiatric history from my progress note on 11/04/2018  Family History:  Family History  Problem Relation Age of Onset  . Anxiety disorder Daughter     Social History: I have reviewed social history from my progress note on 11/04/2018 Social History   Socioeconomic History  . Marital status: Divorced    Spouse name: Not on file  . Number of children: Not on file  . Years of education: Not on file  . Highest education level: Not on file  Occupational History  . Not on file  Social Needs  . Financial resource strain: Not on file  . Food insecurity    Worry: Not on file    Inability: Not on file  . Transportation needs    Medical: Not on file    Non-medical: Not on file  Tobacco Use  . Smoking status: Former Smoker    Packs/day: 0.25    Types: Cigarettes    Quit date: 08/17/2012    Years since quitting: 6.6  . Smokeless tobacco: Never Used  . Tobacco comment: pt states she smoked intermittently-smokers in home  Substance and Sexual Activity  . Alcohol use: Yes    Comment: only on special occasions   . Drug use: No    Comment: denies  . Sexual activity: Not Currently  Lifestyle  . Physical activity    Days per week: Not on file    Minutes per session: Not on file  . Stress: Not on file  Relationships  . Social Herbalist on phone: Not on file    Gets together: Not on file    Attends religious service: Not on file    Active  member of club or organization: Not on file    Attends meetings of clubs or organizations: Not on file    Relationship status: Not on file  Other Topics Concern  . Not on file  Social History Narrative  . Not on file    Allergies: No Known Allergies  Metabolic Disorder Labs: No results found for: HGBA1C, MPG No results found for: PROLACTIN No results found for: CHOL, TRIG, HDL, CHOLHDL, VLDL, LDLCALC Lab Results  Component Value Date   TSH 1.930 11/04/2018    Therapeutic Level Labs: No results found for: LITHIUM No results found for: VALPROATE No components found for:  CBMZ  Current Medications: Current Outpatient Medications  Medication Sig Dispense Refill  . traMADol (ULTRAM) 50 MG tablet Take by mouth.    . Calcium Carb-Cholecalciferol (CALCIUM  600 + D PO) Take 1 tablet by mouth daily.    . cloBAZam (ONFI) 20 MG tablet Take 20 mg by mouth at bedtime.     . diclofenac (VOLTAREN) 75 MG EC tablet Take 75 mg by mouth 2 (two) times daily.    . Flaxseed, Linseed, (FLAXSEED OIL) 1000 MG CAPS Take 1,000 mg by mouth daily.    Marland Kitchen ibuprofen (ADVIL,MOTRIN) 200 MG tablet Take 400 mg by mouth every 8 (eight) hours as needed for mild pain.    Marland Kitchen lamoTRIgine (LAMICTAL) 150 MG tablet Take 150 mg by mouth 2 (two) times daily.     . Levetiracetam (KEPPRA XR) 750 MG TB24 Take 750 mg by mouth 2 (two) times daily.    . Multiple Vitamins-Minerals (CENTRUM ADULTS PO) Take 1 tablet by mouth daily.     Marland Kitchen omeprazole (PRILOSEC) 20 MG capsule Take 20 mg by mouth every morning.     Marland Kitchen oxyCODONE (ROXICODONE) 5 MG immediate release tablet Take 1-2 tablets (5-10 mg total) by mouth every 4 (four) hours as needed for moderate pain or severe pain. 50 tablet 0  . pyridOXINE (VITAMIN B-6) 100 MG tablet Take 100 mg by mouth daily.     . sertraline (ZOLOFT) 50 MG tablet Take 1 tablet (50 mg total) by mouth daily. 30 tablet 1  . Vitamin A 3 MG (10000 UT) TABS Take 3,000 Units by mouth daily.     . vitamin B-12  (CYANOCOBALAMIN) 500 MCG tablet Take 500 mcg by mouth daily.     . vitamin C (ASCORBIC ACID) 500 MG tablet Take 500 mg by mouth daily.    . Vitamin E 200 units TABS Take 200 Units by mouth daily.     Marland Kitchen zonisamide (ZONEGRAN) 100 MG capsule Take 400 mg by mouth daily with supper.     No current facility-administered medications for this visit.      Musculoskeletal: Strength & Muscle Tone: UTA Gait & Station: normal Patient leans: N/A  Psychiatric Specialty Exam: Review of Systems  Psychiatric/Behavioral: The patient is nervous/anxious.   All other systems reviewed and are negative.   There were no vitals taken for this visit.There is no height or weight on file to calculate BMI.  General Appearance: Casual  Eye Contact:  Fair  Speech:  Normal Rate  Volume:  Normal  Mood:  Anxious and Depressed  Affect:  Congruent  Thought Process:  Goal Directed and Descriptions of Associations: Intact  Orientation:  Full (Time, Place, and Person)  Thought Content: Logical   Suicidal Thoughts:  No  Homicidal Thoughts:  No  Memory:  Immediate;   Fair Recent;   Fair Remote;   Fair  Judgement:  Fair  Insight:  Fair  Psychomotor Activity:  Normal  Concentration:  Concentration: Fair and Attention Span: Fair  Recall:  [employer] of Knowledge: Fair  Language: Fair  Akathisia:  No  Handed:  Right  AIMS (if indicated): Denies tremors, rigidity  Assets:  Communication Skills Desire for Improvement Housing  ADL's:  Intact  Cognition: WNL  Sleep:  Fair   Screenings:   Assessment and Plan: Cindy Mcclure is a 53 year old Caucasian female, widowed, lives in [place], has a history of seizure disorder, MDD, GAD, degenerative disc disease, urinary incontinence, chronic pain was evaluated by telemedicine today.  Patient is biologically predisposed given her multiple medical problems and chronic pain as well as family history of mental health problems.  She also has psychosocial stressors of chronic  health issues, relationship  struggles.  Patient continues to struggle with mood symptoms and will benefit from medication readjustment.  Patient to continue psychotherapy sessions.  Plan MDD-improving Increase Zoloft to 50 mg p.o. daily.  Patient advised to take it with supper or at bedtime. Continue CBT with Ms. Cecilie Lowers  For GAD-improving Zoloft as prescribed  Follow-up in clinic in 1 month or sooner if needed.  October 5 at 10 AM  I have spent atleast 15 MINUTES NON  face to face with patient today. More than 50 % of the time was spent for psychoeducation and supportive psychotherapy and care coordination. This note was generated in part or whole with voice recognition software. Voice recognition is usually quite accurate but there are transcription errors that can and very often do occur. I apologize for any typographical errors that were not detected and corrected.       Ursula Alert, MD 04/20/2019, 11:12 AM

## 2019-04-27 ENCOUNTER — Telehealth: Payer: Self-pay

## 2019-04-27 NOTE — Telephone Encounter (Signed)
Zoloft was sent out on 04/20/2019

## 2019-04-27 NOTE — Telephone Encounter (Signed)
pt called left a message that she needs a refill on medication.

## 2019-04-28 ENCOUNTER — Other Ambulatory Visit: Payer: Self-pay

## 2019-04-28 DIAGNOSIS — F411 Generalized anxiety disorder: Secondary | ICD-10-CM

## 2019-04-28 DIAGNOSIS — F320 Major depressive disorder, single episode, mild: Secondary | ICD-10-CM

## 2019-04-28 MED ORDER — SERTRALINE HCL 50 MG PO TABS: 50.0000 mg | ORAL_TABLET | Freq: Every day | ORAL | 1 refills | Status: DC

## 2019-04-28 NOTE — Progress Notes (Signed)
Patient called and her Zoloft was sent to the wrong pharmacy, I updated the pharmacy and resent the medication

## 2019-05-18 ENCOUNTER — Other Ambulatory Visit: Payer: Self-pay

## 2019-05-18 ENCOUNTER — Encounter: Payer: Self-pay | Admitting: Licensed Clinical Social Worker

## 2019-05-18 ENCOUNTER — Ambulatory Visit (INDEPENDENT_AMBULATORY_CARE_PROVIDER_SITE_OTHER): Payer: 59 | Admitting: Licensed Clinical Social Worker

## 2019-05-18 DIAGNOSIS — F320 Major depressive disorder, single episode, mild: Secondary | ICD-10-CM | POA: Diagnosis not present

## 2019-05-18 DIAGNOSIS — F411 Generalized anxiety disorder: Secondary | ICD-10-CM

## 2019-05-18 NOTE — Progress Notes (Signed)
  Virtual Visit via Telephone Note  I connected with Cindy Mcclure on 05/18/19 at 11:00 AM EDT by telephone and verified that I am speaking with the correct person using two identifiers.   I discussed the limitations, risks, security and privacy concerns of performing an evaluation and management service by telephone and the availability of in person appointments. I also discussed with the patient that there may be a patient responsible charge related to this service. The patient expressed understanding and agreed to proceed.  I discussed the assessment and treatment plan with the patient. The patient was provided an opportunity to ask questions and all were answered. The patient agreed with the plan and demonstrated an understanding of the instructions.   The patient was advised to call back or seek an in-person evaluation if the symptoms worsen or if the condition fails to improve as anticipated.  I provided 45 minutes of non-face-to-face time during this encounter.   Alden Hipp, [name]   THERAPIST PROGRESS NOTE  Session Time: 1100  Participation Level: Active  Behavioral Response: NeatAlertIrritable  Type of Therapy: Individual Therapy  Treatment Goals addressed: Coping  Interventions: Supportive  Summary: ILO HARTE is a 53 y.o. female who presents with continued symptoms related to her diagnosis. Jaquana reports doing well since our last session, but reports ongoing issues within her relationships with her daughter and mother. Corayma reports, "since Dr. Shea Evans upped my medication, I'm seeing things more clearly. My mother will tell me one thing but will tell my daughter another thing, and I'm not sure why everyone is lying to me." [name] validated these feelings and encouraged Zena to discuss these problems with her mother. We revisited ways to set firm boundaries and ways to utilize assertive communication with both her mother and her daughter. We walked through  several examples of how Artina could best do this. Another problem Dejane noted was her son bringing his girlfriend over and allowing her to stay there for days at a time. [name] encouraged Leshonda to also set more firm boundaries with her son in order to eliminate the resentment she is feeling now. Canon expressed understanding and agreement with this information as well.    Suicidal/Homicidal: No  Therapist Response: Mellinda continues to work towards her tx goals but has not yet reached them. We will continue to work on improving communication and setting boundaries with family members.   Plan: Return again in 4 weeks.  Diagnosis: Axis I: Generalized Anxiety Disorder    Axis II: No diagnosis    Alden Hipp, [name] 05/18/2019

## 2019-05-23 ENCOUNTER — Ambulatory Visit (INDEPENDENT_AMBULATORY_CARE_PROVIDER_SITE_OTHER): Payer: 59 | Admitting: Psychiatry

## 2019-05-23 ENCOUNTER — Encounter: Payer: Self-pay | Admitting: Psychiatry

## 2019-05-23 ENCOUNTER — Other Ambulatory Visit: Payer: Self-pay

## 2019-05-23 DIAGNOSIS — F320 Major depressive disorder, single episode, mild: Secondary | ICD-10-CM

## 2019-05-23 DIAGNOSIS — F411 Generalized anxiety disorder: Secondary | ICD-10-CM

## 2019-05-23 NOTE — Progress Notes (Signed)
Virtual Visit via Video Note  I connected with Cindy Mcclure on 05/23/19 at 10:00 AM EDT by a video enabled telemedicine application and verified that I am speaking with the correct person using two identifiers.   I discussed the limitations of evaluation and management by telemedicine and the availability of in person appointments. The patient expressed understanding and agreed to proceed.   I discussed the assessment and treatment plan with the patient. The patient was provided an opportunity to ask questions and all were answered. The patient agreed with the plan and demonstrated an understanding of the instructions.   The patient was advised to call back or seek an in-person evaluation if the symptoms worsen or if the condition fails to improve as anticipated.   [place] MD OP Progress Note  05/23/2019 10:13 AM [name]  MRN:  [id number]  Chief Complaint:  Chief Complaint    Follow-up     HPI: Cindy Mcclure is a 53 year old Caucasian female on disability, lives in [place], has a history of MDD, GAD, seizure disorder, urinary incontinence, degenerative disc disease was evaluated by telemedicine today.  Patient reports she is currently making progress on the Zoloft.  She is on a higher dosage at this time.  She is tolerating it well.  She does report feeling sad on and off.  She however reports that Zoloft has definitely helped.  She has also started psychotherapy sessions with Ms. Alden Hipp which is going well.  She reports sleep is good.  Patient denies any suicidality, homicidality or perceptual disturbances.  Patient reports she continues to work with her other providers.  She reports she is no longer taking oxycodone.  She reports her pain is more under control now.  She reports her daughter went to the beach and now she is spending more time with her mother.  She reports that is going well.  Patient denies any other concerns today. Visit Diagnosis:    ICD-10-CM    1. MDD (major depressive disorder), single episode, mild ([place])  F32.0   2. GAD (generalized anxiety disorder)  F41.1     Past Psychiatric History: I have reviewed past psychiatric history from my progress note on 11/04/2018  Past Medical History:  Past Medical History:  Diagnosis Date  . Allergic rhinitis   . Anginal pain [facility])    sees dr Clayborn Bigness  . Anxiety   . Arthritis    all over...knees, ankles, back  . Back pain   . Cervical radiculitis    no surgery, just cortisone shots.    . Constipation   . Constipation   . DDD (degenerative disc disease), cervical   . Dermatophytosis of nail 2019   both feet  . GERD (gastroesophageal reflux disease)   . History of kidney stones    h/o  . Osteoarthritis   . Panic attacks   . Plantar fasciitis    bilaterally  . Seizures ([place]) 1972   unsure of when she had last seizure due to petit mal type. does not know what brings on a seizure.  . Stress bladder incontinence, female   . Urinary incontinence     Past Surgical History:  Procedure Laterality Date  . CESAREAN SECTION  [id number]  . CHOLECYSTECTOMY N/A 12/06/2015   Procedure: LAPAROSCOPIC CHOLECYSTECTOMY WITH INTRAOPERATIVE CHOLANGIOGRAM;  Surgeon: Dia Crawford III, MD;  Location: [facility] ORS;  Service: General;  Laterality: N/A;  . COLONOSCOPY WITH PROPOFOL N/A 09/29/2016   Procedure: COLONOSCOPY WITH PROPOFOL;  Surgeon: Manya Silvas, MD;  Location: [facility] ENDOSCOPY;  Service: Endoscopy;  Laterality: N/A;  . KNEE ARTHROSCOPY WITH EXCISION PLICA Left [id number]   Procedure: KNEE ARTHROSCOPY WITH EXCISION PLICA, PARTIAL SYNOVECTOMY;  Surgeon: Hessie Knows, MD;  Location: [facility] ORS;  Service: Orthopedics;  Laterality: Left;  . KNEE ARTHROSCOPY WITH MEDIAL MENISECTOMY Right 09/03/2017   Procedure: KNEE ARTHROSCOPY WITH MEDIAL MENISECTOMY, PARTIAL SYNOVECTOMY;  Surgeon: Hessie Knows, MD;  Location: [facility] ORS;  Service: Orthopedics;  Laterality: Right;  . SHOULDER ARTHROSCOPY WITH DEBRIDEMENT  AND BICEP TENDON REPAIR Left 03/22/2019   Procedure: SHOULDER ARTHROSCOPY WITH DEBRIDEMENT, ROTATOR CUFF REPAIR, AND BICEP TENDON REPAIR;  Surgeon: Corky Mull, MD;  Location: [facility] ORS;  Service: Orthopedics;  Laterality: Left;  . SUBACROMIAL DECOMPRESSION  03/22/2019   Procedure: SUBACROMIAL DECOMPRESSION;  Surgeon: Corky Mull, MD;  Location: [facility] ORS;  Service: Orthopedics;;  . SURGERY FOR SEIZURES     nothing implanted in head. she was 53 years old. done at [facility]  . TUBAL LIGATION  2003    Family Psychiatric History: I have reviewed family psychiatric history from my progress note on 11/04/2018  Family History:  Family History  Problem Relation Age of Onset  . Anxiety disorder Daughter     Social History: I have reviewed social history from my progress note on 11/04/2018 Social History   Socioeconomic History  . Marital status: Divorced    Spouse name: Not on file  . Number of children: Not on file  . Years of education: Not on file  . Highest education level: Not on file  Occupational History  . Not on file  Social Needs  . Financial resource strain: Not on file  . Food insecurity    Worry: Not on file    Inability: Not on file  . Transportation needs    Medical: Not on file    Non-medical: Not on file  Tobacco Use  . Smoking status: Former Smoker    Packs/day: 0.25    Types: Cigarettes    Quit date: 08/17/2012    Years since quitting: 6.7  . Smokeless tobacco: Never Used  . Tobacco comment: pt states she smoked intermittently-smokers in home  Substance and Sexual Activity  . Alcohol use: Yes    Comment: only on special occasions   . Drug use: No    Comment: denies  . Sexual activity: Not Currently  Lifestyle  . Physical activity    Days per week: Not on file    Minutes per session: Not on file  . Stress: Not on file  Relationships  . Social Herbalist on phone: Not on file    Gets together: Not on file    Attends religious service: Not on file     Active member of club or organization: Not on file    Attends meetings of clubs or organizations: Not on file    Relationship status: Not on file  Other Topics Concern  . Not on file  Social History Narrative  . Not on file    Allergies: No Known Allergies  Metabolic Disorder Labs: No results found for: HGBA1C, MPG No results found for: PROLACTIN No results found for: CHOL, TRIG, HDL, CHOLHDL, VLDL, LDLCALC Lab Results  Component Value Date   TSH 1.930 11/04/2018    Therapeutic Level Labs: No results found for: LITHIUM No results found for: VALPROATE No components found for:  CBMZ  Current Medications: Current Outpatient Medications  Medication Sig Dispense Refill  . Calcium Carb-Cholecalciferol (CALCIUM 600 +  D PO) Take 1 tablet by mouth daily.    . cloBAZam (ONFI) 20 MG tablet Take 20 mg by mouth at bedtime.     . diclofenac (VOLTAREN) 75 MG EC tablet Take 75 mg by mouth 2 (two) times daily.    . Flaxseed, Linseed, (FLAXSEED OIL) 1000 MG CAPS Take 1,000 mg by mouth daily.    Marland Kitchen ibuprofen (ADVIL,MOTRIN) 200 MG tablet Take 400 mg by mouth every 8 (eight) hours as needed for mild pain.    Marland Kitchen lamoTRIgine (LAMICTAL) 150 MG tablet Take 150 mg by mouth 2 (two) times daily.     . Levetiracetam (KEPPRA XR) 750 MG TB24 Take 750 mg by mouth 2 (two) times daily.    . Multiple Vitamins-Minerals (CENTRUM ADULTS PO) Take 1 tablet by mouth daily.     Marland Kitchen omeprazole (PRILOSEC) 20 MG capsule Take 20 mg by mouth every morning.     . pyridOXINE (VITAMIN B-6) 100 MG tablet Take 100 mg by mouth daily.     . sertraline (ZOLOFT) 50 MG tablet Take 1 tablet (50 mg total) by mouth daily. 30 tablet 1  . traMADol (ULTRAM) 50 MG tablet Take by mouth.    . Vitamin A 3 MG (10000 UT) TABS Take 3,000 Units by mouth daily.     . vitamin B-12 (CYANOCOBALAMIN) 500 MCG tablet Take 500 mcg by mouth daily.     . vitamin C (ASCORBIC ACID) 500 MG tablet Take 500 mg by mouth daily.    . Vitamin E 200 units TABS  Take 200 Units by mouth daily.     Marland Kitchen zonisamide (ZONEGRAN) 100 MG capsule Take 400 mg by mouth daily with supper.     No current facility-administered medications for this visit.      Musculoskeletal: Strength & Muscle Tone: UTA Gait & Station: Observed as seated Patient leans: N/A  Psychiatric Specialty Exam: Review of Systems  Psychiatric/Behavioral: Positive for depression.  All other systems reviewed and are negative.   There were no vitals taken for this visit.There is no height or weight on file to calculate BMI.  General Appearance: Casual  Eye Contact:  Fair  Speech:  Clear and Coherent  Volume:  Normal  Mood:  Depressed improving  Affect:  Congruent  Thought Process:  Goal Directed and Descriptions of Associations: Intact  Orientation:  Full (Time, Place, and Person)  Thought Content: Logical   Suicidal Thoughts:  No  Homicidal Thoughts:  No  Memory:  Immediate;   Fair Recent;   Fair Remote;   Fair  Judgement:  Fair  Insight:  Fair  Psychomotor Activity:  Normal  Concentration:  Concentration: Fair and Attention Span: Fair  Recall:  [employer] of Knowledge: Fair  Language: Fair  Akathisia:  No  Handed:  Right  AIMS (if indicated): denies tremors, rigidity  Assets:  Communication Skills Desire for Improvement Housing Social Support  ADL's:  Intact  Cognition: WNL  Sleep:  Fair   Screenings: PHQ2-9     Office Visit from 05/23/2019 in [place]  PHQ-2 Total Score  2  PHQ-9 Total Score  6       Assessment and Plan: Glendean is a 53 year old Caucasian female, widowed, lives in [place], has a history of seizure disorder, MDD, GAD, degenerative disc disease, urinary incontinence, chronic pain was evaluated by telemedicine today.  Patient is biologically predisposed given her multiple medical problems and chronic pain as well as family history of mental health problems.  She  also has psychosocial stressors of chronic health  issues, relationship struggles.  Patient is currently making progress on the medications.  Plan MDD- stable Zoloft 50 mg p.o. daily PHQ-9 equals 6 Continue CBT with Ms. Alden Hipp  GAD-improving Zoloft as prescribed  Follow-up in clinic in 1 month or sooner if needed.  November 6 at 11 AM  I have spent atleast 15 minutes non face to face with patient today. More than 50 % of the time was spent for psychoeducation and supportive psychotherapy and care coordination. This note was generated in part or whole with voice recognition software. Voice recognition is usually quite accurate but there are transcription errors that can and very often do occur. I apologize for any typographical errors that were not detected and corrected.       Ursula Alert, MD 05/23/2019, 10:13 AM

## 2019-05-31 ENCOUNTER — Other Ambulatory Visit: Payer: Self-pay | Admitting: Psychiatry

## 2019-05-31 DIAGNOSIS — F411 Generalized anxiety disorder: Secondary | ICD-10-CM

## 2019-05-31 DIAGNOSIS — F320 Major depressive disorder, single episode, mild: Secondary | ICD-10-CM

## 2019-06-15 ENCOUNTER — Other Ambulatory Visit: Payer: Self-pay

## 2019-06-15 ENCOUNTER — Encounter: Payer: Self-pay | Admitting: Licensed Clinical Social Worker

## 2019-06-15 ENCOUNTER — Ambulatory Visit (INDEPENDENT_AMBULATORY_CARE_PROVIDER_SITE_OTHER): Payer: 59 | Admitting: Licensed Clinical Social Worker

## 2019-06-15 DIAGNOSIS — F411 Generalized anxiety disorder: Secondary | ICD-10-CM

## 2019-06-15 DIAGNOSIS — F320 Major depressive disorder, single episode, mild: Secondary | ICD-10-CM

## 2019-06-15 NOTE — Progress Notes (Signed)
Virtual Visit via Telephone Note  I connected with Cindy Mcclure on 06/15/19 at 10:00 AM EDT by telephone and verified that I am speaking with the correct person using two identifiers.   I discussed the limitations, risks, security and privacy concerns of performing an evaluation and management service by telephone and the availability of in person appointments. I also discussed with the patient that there may be a patient responsible charge related to this service. The patient expressed understanding and agreed to proceed.  I discussed the assessment and treatment plan with the patient. The patient was provided an opportunity to ask questions and all were answered. The patient agreed with the plan and demonstrated an understanding of the instructions.   The patient was advised to call back or seek an in-person evaluation if the symptoms worsen or if the condition fails to improve as anticipated.  I provided 53 minutes of non-face-to-face time during this encounter.   Alden Hipp, [name]    THERAPIST PROGRESS NOTE  Session Time: 1000  Participation Level: Active  Behavioral Response: NeatAlertAnxious  Type of Therapy: Individual Therapy  Treatment Goals addressed: Anxiety  Interventions: Supportive  Summary: WAREESHA GETSON is a 53 y.o. female who presents with continued symptoms related to her diagnosis. Darcy reports doing well since our last session, but noted ongoing problems within her relationships with her mother dand daughter. Alazay reported her daughter has continued to not listen to her or do things she's asked to do. Nessa reports she feels she has no control over her daughter. [name] validated Geraline's feelings, and encouraged her to begin setting boundaries with her daughter. We discussed how she could take away privileges in order to motivate her daughter to listen more appropriately. Selima reports she is unsure if she will be able to do this, due to  feeling her mother will step in and undermine what she says. [name] encouraged her to have a conversation with her mother to alert her that she needs to support Randell's parenting decisions. Fatimata expressed feeling stuck and as though she is unable to change behaviors. [name] encouraged Aliyanna to at least attempt to have these conversations with both her daughter and her mother, and to recognize she has to make changes in order to have any different results. Danieliz expressed understanding.   Suicidal/Homicidal: No  Therapist Response: Chasidi continues to work towards her tx goals but has not yet reached them. We will continue to work on boundary setting and utilizing CBT skills moving forward.   Plan: Return again in 4 weeks.  Diagnosis: Axis I: MDD    Axis II: No diagnosis    Alden Hipp, [name] 06/15/2019

## 2019-06-23 NOTE — Progress Notes (Signed)
Virtual Visit via Video Note  I connected with Cindy Mcclure on 06/24/19 at 11:00 AM EST by a video enabled telemedicine application and verified that I am speaking with the correct person using two identifiers.   I discussed the limitations of evaluation and management by telemedicine and the availability of in person appointments. The patient expressed understanding and agreed to proceed.   I discussed the assessment and treatment plan with the patient. The patient was provided an opportunity to ask questions and all were answered. The patient agreed with the plan and demonstrated an understanding of the instructions.   The patient was advised to call back or seek an in-person evaluation if the symptoms worsen or if the condition fails to improve as anticipated.   [place] MD OP Progress Note  06/24/2019 12:23 PM TAYLRE RAISON  MRN:  [id number]  Chief Complaint:  Chief Complaint    Follow-up     HPI: Cindy Mcclure is a 53 year old Caucasian female on disability, lives in [place], has a history of MDD, GAD, seizure disorder, urinary incontinence, degenerative disc disease was evaluated by telemedicine today.  Patient today reports she is currently struggling with depressive symptoms.  She reports that Zoloft has improved some of her symptoms.  She denies any side effects.  She reports she however continues to struggle with her relationship with her mother and her daughter.  She feels isolated and excluded from what ever is going on in their life and that kind of frustrates her.  She feels they are ganging up together.  Patient reports she is currently in psychotherapy sessions which is going well.  She is trying to work on some techniques that her therapist has been teaching her.  She reports sleep is good.  Patient denies any suicidality, homicidality or perceptual disturbances.  Patient denies any other concerns today.      Visit Diagnosis:    ICD-10-CM   1. MDD (major  depressive disorder), single episode, mild ([facility])  F32.0 sertraline (ZOLOFT) 100 MG tablet  2. GAD (generalized anxiety disorder)  F41.1 sertraline (ZOLOFT) 100 MG tablet    Past Psychiatric History: Reviewed past psychiatric history from my progress note on 11/04/2018  Past Medical History:  Past Medical History:  Diagnosis Date  . Allergic rhinitis   . Anginal pain [facility])    sees dr Clayborn Bigness  . Anxiety   . Arthritis    all over...knees, ankles, back  . Back pain   . Cervical radiculitis    no surgery, just cortisone shots.    . Constipation   . Constipation   . DDD (degenerative disc disease), cervical   . Dermatophytosis of nail 2019   both feet  . GERD (gastroesophageal reflux disease)   . History of kidney stones    h/o  . Osteoarthritis   . Panic attacks   . Plantar fasciitis    bilaterally  . Seizures ([place]) 1972   unsure of when she had last seizure due to petit mal type. does not know what brings on a seizure.  . Stress bladder incontinence, female   . Urinary incontinence     Past Surgical History:  Procedure Laterality Date  . CESAREAN SECTION  [id number]  . CHOLECYSTECTOMY N/A 12/06/2015   Procedure: LAPAROSCOPIC CHOLECYSTECTOMY WITH INTRAOPERATIVE CHOLANGIOGRAM;  Surgeon: Dia Crawford III, MD;  Location: [facility] ORS;  Service: General;  Laterality: N/A;  . COLONOSCOPY WITH PROPOFOL N/A 09/29/2016   Procedure: COLONOSCOPY WITH PROPOFOL;  Surgeon: Manya Silvas, MD;  Location:  [facility];  Service: Endoscopy;  Laterality: N/A;  . KNEE ARTHROSCOPY WITH EXCISION PLICA Left [id number]   Procedure: KNEE ARTHROSCOPY WITH EXCISION PLICA, PARTIAL SYNOVECTOMY;  Surgeon: Hessie Knows, MD;  Location: [facility] ORS;  Service: Orthopedics;  Laterality: Left;  . KNEE ARTHROSCOPY WITH MEDIAL MENISECTOMY Right 09/03/2017   Procedure: KNEE ARTHROSCOPY WITH MEDIAL MENISECTOMY, PARTIAL SYNOVECTOMY;  Surgeon: Hessie Knows, MD;  Location: [facility] ORS;  Service: Orthopedics;  Laterality: Right;   . SHOULDER ARTHROSCOPY WITH DEBRIDEMENT AND BICEP TENDON REPAIR Left 03/22/2019   Procedure: SHOULDER ARTHROSCOPY WITH DEBRIDEMENT, ROTATOR CUFF REPAIR, AND BICEP TENDON REPAIR;  Surgeon: Corky Mull, MD;  Location: [facility] ORS;  Service: Orthopedics;  Laterality: Left;  . SUBACROMIAL DECOMPRESSION  03/22/2019   Procedure: SUBACROMIAL DECOMPRESSION;  Surgeon: Corky Mull, MD;  Location: [facility] ORS;  Service: Orthopedics;;  . SURGERY FOR SEIZURES     nothing implanted in head. she was 53 years old. done [facility]  . TUBAL LIGATION  2003    Family Psychiatric History: Reviewed family psychiatric history from my progress note on 11/04/2018  Family History:  Family History  Problem Relation Age of Onset  . Anxiety disorder Daughter     Social History: Reviewed social history from my progress note on 11/04/2018 Social History   Socioeconomic History  . Marital status: Divorced    Spouse name: Not on file  . Number of children: Not on file  . Years of education: Not on file  . Highest education level: Not on file  Occupational History  . Not on file  Social Needs  . Financial resource strain: Not on file  . Food insecurity    Worry: Not on file    Inability: Not on file  . Transportation needs    Medical: Not on file    Non-medical: Not on file  Tobacco Use  . Smoking status: Former Smoker    Packs/day: 0.25    Types: Cigarettes    Quit date: 08/17/2012    Years since quitting: 6.8  . Smokeless tobacco: Never Used  . Tobacco comment: pt states she smoked intermittently-smokers in home  Substance and Sexual Activity  . Alcohol use: Yes    Comment: only on special occasions   . Drug use: No    Comment: denies  . Sexual activity: Not Currently  Lifestyle  . Physical activity    Days per week: Not on file    Minutes per session: Not on file  . Stress: Not on file  Relationships  . Social Herbalist on phone: Not on file    Gets together: Not on file    Attends  religious service: Not on file    Active member of club or organization: Not on file    Attends meetings of clubs or organizations: Not on file    Relationship status: Not on file  Other Topics Concern  . Not on file  Social History Narrative  . Not on file    Allergies: No Known Allergies  Metabolic Disorder Labs: No results found for: HGBA1C, MPG No results found for: PROLACTIN No results found for: CHOL, TRIG, HDL, CHOLHDL, VLDL, LDLCALC Lab Results  Component Value Date   TSH 1.930 11/04/2018    Therapeutic Level Labs: No results found for: LITHIUM No results found for: VALPROATE No components found for:  CBMZ  Current Medications: Current Outpatient Medications  Medication Sig Dispense Refill  . Calcium Carb-Cholecalciferol (CALCIUM 600 + D PO) Take 1  tablet by mouth daily.    . cloBAZam (ONFI) 20 MG tablet Take 20 mg by mouth at bedtime.     . diclofenac (VOLTAREN) 75 MG EC tablet Take 75 mg by mouth 2 (two) times daily.    . Flaxseed, Linseed, (FLAXSEED OIL) 1000 MG CAPS Take 1,000 mg by mouth daily.    Marland Kitchen ibuprofen (ADVIL,MOTRIN) 200 MG tablet Take 400 mg by mouth every 8 (eight) hours as needed for mild pain.    Marland Kitchen lamoTRIgine (LAMICTAL) 150 MG tablet Take 150 mg by mouth 2 (two) times daily.     . Levetiracetam (KEPPRA XR) 750 MG TB24 Take 750 mg by mouth 2 (two) times daily.    . Multiple Vitamins-Minerals (CENTRUM ADULTS PO) Take 1 tablet by mouth daily.     Marland Kitchen omeprazole (PRILOSEC) 20 MG capsule Take 20 mg by mouth every morning.     . pyridOXINE (VITAMIN B-6) 100 MG tablet Take 100 mg by mouth daily.     . sertraline (ZOLOFT) 100 MG tablet Take 1 tablet (100 mg total) by mouth daily. 30 tablet 2  . traMADol (ULTRAM) 50 MG tablet Take by mouth.    . Vitamin A 3 MG (10000 UT) TABS Take 3,000 Units by mouth daily.     . vitamin B-12 (CYANOCOBALAMIN) 500 MCG tablet Take 500 mcg by mouth daily.     . vitamin C (ASCORBIC ACID) 500 MG tablet Take 500 mg by mouth daily.     . Vitamin E 200 units TABS Take 200 Units by mouth daily.     Marland Kitchen zonisamide (ZONEGRAN) 100 MG capsule Take 400 mg by mouth daily with supper.     No current facility-administered medications for this visit.      Musculoskeletal: Strength & Muscle Tone: UTA Gait & Station: normal Patient leans: N/A  Psychiatric Specialty Exam: Review of Systems  Psychiatric/Behavioral: Positive for depression.  All other systems reviewed and are negative.   There were no vitals taken for this visit.There is no height or weight on file to calculate BMI.  General Appearance: Casual  Eye Contact:  Fair  Speech:  Clear and Coherent  Volume:  Normal  Mood:  Depressed  Affect:  Tearful  Thought Process:  Goal Directed and Descriptions of Associations: Intact  Orientation:  Full (Time, Place, and Person)  Thought Content: Logical   Suicidal Thoughts:  No  Homicidal Thoughts:  No  Memory:  Immediate;   Fair Recent;   Fair Remote;   Fair  Judgement:  Good  Insight:  Fair  Psychomotor Activity:  Normal  Concentration:  Concentration: Fair and Attention Span: Fair  Recall:  [employer] of Knowledge: Fair  Language: Fair  Akathisia:  No  Handed:  Right  AIMS (if indicated): Denies tremors, rigidity  Assets:  Communication Skills Desire for Improvement Housing Transportation  ADL's:  Intact  Cognition: WNL  Sleep:  Fair   Screenings: PHQ2-9     Office Visit from 05/23/2019 in [facility]  PHQ-2 Total Score  2  PHQ-9 Total Score  6       Assessment and Plan: Trayana is a 53 year old Caucasian female, widowed, lives in [place], has a history of seizure disorder, MDD, GAD, degenerative disc disease, urinary incontinence, chronic pain was evaluated by telemedicine today.  She is biologically predisposed given her multiple medical problems and chronic pain as well as family history of mental health problems.  She also has psychosocial stressors of chronic  health  issues, relationship struggles.  Patient continues to struggle with depressive symptoms although she is is currently making progress on the current medication regimen.  Plan MDD-some improvement Increase Zoloft to 100 mg p.o. daily Continue CBT with Ms. Alden Hipp  GAD-improving Zoloft as prescribed  Follow-up in clinic in 1 month or sooner if needed.  December 8 at 4 PM   I have spent atleast 15 minutes non  face to face with patient today. More than 50 % of the time was spent for psychoeducation and supportive psychotherapy and care coordination. This note was generated in part or whole with voice recognition software. Voice recognition is usually quite accurate but there are transcription errors that can and very often do occur. I apologize for any typographical errors that were not detected and corrected.     Ursula Alert, MD 06/24/2019, 12:23 PM

## 2019-06-24 ENCOUNTER — Ambulatory Visit (INDEPENDENT_AMBULATORY_CARE_PROVIDER_SITE_OTHER): Payer: 59 | Admitting: Psychiatry

## 2019-06-24 ENCOUNTER — Other Ambulatory Visit: Payer: Self-pay

## 2019-06-24 ENCOUNTER — Encounter: Payer: Self-pay | Admitting: Psychiatry

## 2019-06-24 DIAGNOSIS — F411 Generalized anxiety disorder: Secondary | ICD-10-CM

## 2019-06-24 DIAGNOSIS — F320 Major depressive disorder, single episode, mild: Secondary | ICD-10-CM

## 2019-06-24 MED ORDER — SERTRALINE HCL 100 MG PO TABS: 100.0000 mg | ORAL_TABLET | Freq: Every day | ORAL | 2 refills | Status: DC

## 2019-06-27 ENCOUNTER — Other Ambulatory Visit: Payer: Self-pay

## 2019-06-27 ENCOUNTER — Ambulatory Visit (INDEPENDENT_AMBULATORY_CARE_PROVIDER_SITE_OTHER): Payer: 59 | Admitting: Licensed Clinical Social Worker

## 2019-06-27 ENCOUNTER — Encounter: Payer: Self-pay | Admitting: Licensed Clinical Social Worker

## 2019-06-27 DIAGNOSIS — F320 Major depressive disorder, single episode, mild: Secondary | ICD-10-CM

## 2019-06-27 NOTE — Progress Notes (Signed)
Virtual Visit via Telephone Note  I connected with Cindy Mcclure on 06/27/19 at 11:00 AM EST by telephone and verified that I am speaking with the correct person using two identifiers.   I discussed the limitations, risks, security and privacy concerns of performing an evaluation and management service by telephone and the availability of in person appointments. I also discussed with the patient that there may be a patient responsible charge related to this service. The patient expressed understanding and agreed to proceed.   I discussed the assessment and treatment plan with the patient. The patient was provided an opportunity to ask questions and all were answered. The patient agreed with the plan and demonstrated an understanding of the instructions.   The patient was advised to call back or seek an in-person evaluation if the symptoms worsen or if the condition fails to improve as anticipated.  I provided 45  minutes of non-face-to-face time during this encounter.   Alden Hipp, [name]   THERAPIST PROGRESS NOTE  Session Time: 1100  Participation Level: Active  Behavioral Response: NeatAlertAnxious and Depressed  Type of Therapy: Individual Therapy  Treatment Goals addressed: Anxiety  Interventions: Supportive  Summary: Cindy Mcclure is a 53 y.o. female who presents with continued symptoms related to her diagnosis. [name] reports doing "about the same," since our last session. [name] reports continued stress and anxiety over her relationships with her mother and daughter. She reports her mother is still taking her daughter places without inviting her, and her daughter will not listen to her when she attempts to set boundaries. [name] validated Cindy Mcclure's feelings, and encouraged her to look at her behavior and see what she can change. We discussed how we do not have control over others' actions, but we do have control over ours. We discussed ways to set appropriate boundaries  with her daughter, and taking back control over the situation--she is the parent and her daughter is the child. We discussed ways to communicate her needs, and to allow herself set consequences with her daughter in order to see different results. We discussed how Cindy Mcclure is continuing to do the same things and is getting the same results. We discussed what she could change, though Cindy Mcclure was very apprehensive to try new things within her relationships.   Suicidal/Homicidal: No  Therapist Response: [name] continues to work towards her tx goals but has not yet reached them. We will continue to work on improving communication and assertiveness.   Plan: Return again in 4 weeks.  Diagnosis: Axis I: MDD    Axis II: No diagnosis    Alden Hipp, [name] 06/27/2019

## 2019-07-07 ENCOUNTER — Telehealth: Payer: Self-pay | Admitting: Psychiatry

## 2019-07-07 DIAGNOSIS — F320 Major depressive disorder, single episode, mild: Secondary | ICD-10-CM

## 2019-07-07 DIAGNOSIS — F411 Generalized anxiety disorder: Secondary | ICD-10-CM

## 2019-07-07 MED ORDER — SERTRALINE HCL 100 MG PO TABS: 100.0000 mg | ORAL_TABLET | Freq: Every day | ORAL | 2 refills | Status: DC

## 2019-07-07 NOTE — Telephone Encounter (Signed)
Received cinical question from [facility] - patient is on zoloft 100 mg. Sent escript with clarification.

## 2019-07-25 ENCOUNTER — Other Ambulatory Visit: Payer: Self-pay

## 2019-07-25 ENCOUNTER — Ambulatory Visit: Payer: 59 | Admitting: Licensed Clinical Social Worker

## 2019-07-26 ENCOUNTER — Ambulatory Visit (INDEPENDENT_AMBULATORY_CARE_PROVIDER_SITE_OTHER): Payer: 59 | Admitting: Psychiatry

## 2019-07-26 ENCOUNTER — Encounter: Payer: Self-pay | Admitting: Psychiatry

## 2019-07-26 DIAGNOSIS — F320 Major depressive disorder, single episode, mild: Secondary | ICD-10-CM

## 2019-07-26 DIAGNOSIS — F411 Generalized anxiety disorder: Secondary | ICD-10-CM

## 2019-07-26 NOTE — Progress Notes (Signed)
Virtual Visit via Video Note  I connected with Cindy Mcclure on 07/26/19 at  4:00 PM EST by a video enabled telemedicine application and verified that I am speaking with the correct person using two identifiers.   I discussed the limitations of evaluation and management by telemedicine and the availability of in person appointments. The patient expressed understanding and agreed to proceed.    I discussed the assessment and treatment plan with the patient. The patient was provided an opportunity to ask questions and all were answered. The patient agreed with the plan and demonstrated an understanding of the instructions.   The patient was advised to call back or seek an in-person evaluation if the symptoms worsen or if the condition fails to improve as anticipated.  [place] MD OP Progress Note  07/26/2019 5:37 PM Cindy Mcclure  MRN:  [id number]  Chief Complaint:  Chief Complaint    Follow-up     HPI: Cindy Mcclure is a 53 year old Caucasian female on disability, lives in [place], has a history of MDD, GAD, seizure disorder, urinary incontinence, degenerative disc disease was evaluated by telemedicine today.  Patient reports she is currently doing well with regards to her depressive symptoms.  She is compliant on her Zoloft.  She denies side effects.  She reports sleep is good.  She continues to be in psychotherapy sessions and is motivated to keep her therapy appointments.  She is currently working on her relationship with her daughter.  She denies any other concerns today. Visit Diagnosis:    ICD-10-CM   1. MDD (major depressive disorder), single episode, mild (HCC)  F32.0    improving  2. GAD (generalized anxiety disorder)  F41.1     Past Psychiatric History: I have reviewed past psychiatric history from my progress note on 11/04/2018.  Past Medical History:  Past Medical History:  Diagnosis Date  . Allergic rhinitis   . Anginal pain [facility])    sees dr Clayborn Bigness  .  Anxiety   . Arthritis    all over...knees, ankles, back  . Back pain   . Cervical radiculitis    no surgery, just cortisone shots.    . Constipation   . Constipation   . DDD (degenerative disc disease), cervical   . Dermatophytosis of nail 2019   both feet  . GERD (gastroesophageal reflux disease)   . History of kidney stones    h/o  . Osteoarthritis   . Panic attacks   . Plantar fasciitis    bilaterally  . Seizures ([place]) 1972   unsure of when she had last seizure due to petit mal type. does not know what brings on a seizure.  . Stress bladder incontinence, female   . Urinary incontinence     Past Surgical History:  Procedure Laterality Date  . CESAREAN SECTION  [id number]  . CHOLECYSTECTOMY N/A 12/06/2015   Procedure: LAPAROSCOPIC CHOLECYSTECTOMY WITH INTRAOPERATIVE CHOLANGIOGRAM;  Surgeon: Dia Crawford III, MD;  Location: [facility] ORS;  Service: General;  Laterality: N/A;  . COLONOSCOPY WITH PROPOFOL N/A 09/29/2016   Procedure: COLONOSCOPY WITH PROPOFOL;  Surgeon: Manya Silvas, MD;  Location: [facility] ENDOSCOPY;  Service: Endoscopy;  Laterality: N/A;  . KNEE ARTHROSCOPY WITH EXCISION PLICA Left [id number]   Procedure: KNEE ARTHROSCOPY WITH EXCISION PLICA, PARTIAL SYNOVECTOMY;  Surgeon: Hessie Knows, MD;  Location: [facility] ORS;  Service: Orthopedics;  Laterality: Left;  . KNEE ARTHROSCOPY WITH MEDIAL MENISECTOMY Right 09/03/2017   Procedure: KNEE ARTHROSCOPY WITH MEDIAL MENISECTOMY, PARTIAL SYNOVECTOMY;  Surgeon: Hessie Knows, MD;  Location: [facility] ORS;  Service: Orthopedics;  Laterality: Right;  . SHOULDER ARTHROSCOPY WITH DEBRIDEMENT AND BICEP TENDON REPAIR Left 03/22/2019   Procedure: SHOULDER ARTHROSCOPY WITH DEBRIDEMENT, ROTATOR CUFF REPAIR, AND BICEP TENDON REPAIR;  Surgeon: Corky Mull, MD;  Location: [facility] ORS;  Service: Orthopedics;  Laterality: Left;  . SUBACROMIAL DECOMPRESSION  03/22/2019   Procedure: SUBACROMIAL DECOMPRESSION;  Surgeon: Corky Mull, MD;  Location: [facility] ORS;   Service: Orthopedics;;  . SURGERY FOR SEIZURES     nothing implanted in head. she was 53 years old. done at [facility]  . TUBAL LIGATION  2003    Family Psychiatric History: I have reviewed family psychiatric history from my progress note on 11/04/2018.  Family History:  Family History  Problem Relation Age of Onset  . Anxiety disorder Daughter     Social History: Reviewed social history from my progress note on 11/04/2018. Social History   Socioeconomic History  . Marital status: Divorced    Spouse name: Not on file  . Number of children: Not on file  . Years of education: Not on file  . Highest education level: Not on file  Occupational History  . Not on file  Social Needs  . Financial resource strain: Not on file  . Food insecurity    Worry: Not on file    Inability: Not on file  . Transportation needs    Medical: Not on file    Non-medical: Not on file  Tobacco Use  . Smoking status: Former Smoker    Packs/day: 0.25    Types: Cigarettes    Quit date: 08/17/2012    Years since quitting: 6.9  . Smokeless tobacco: Never Used  . Tobacco comment: pt states she smoked intermittently-smokers in home  Substance and Sexual Activity  . Alcohol use: Yes    Comment: only on special occasions   . Drug use: No    Comment: denies  . Sexual activity: Not Currently  Lifestyle  . Physical activity    Days per week: Not on file    Minutes per session: Not on file  . Stress: Not on file  Relationships  . Social Herbalist on phone: Not on file    Gets together: Not on file    Attends religious service: Not on file    Active member of club or organization: Not on file    Attends meetings of clubs or organizations: Not on file    Relationship status: Not on file  Other Topics Concern  . Not on file  Social History Narrative  . Not on file    Allergies: No Known Allergies  Metabolic Disorder Labs: No results found for: HGBA1C, MPG No results found for:  PROLACTIN No results found for: CHOL, TRIG, HDL, CHOLHDL, VLDL, LDLCALC Lab Results  Component Value Date   TSH 1.930 11/04/2018    Therapeutic Level Labs: No results found for: LITHIUM No results found for: VALPROATE No components found for:  CBMZ  Current Medications: Current Outpatient Medications  Medication Sig Dispense Refill  . Calcium Carb-Cholecalciferol (CALCIUM 600 + D PO) Take 1 tablet by mouth daily.    . cloBAZam (ONFI) 20 MG tablet Take 20 mg by mouth at bedtime.     . diclofenac (VOLTAREN) 75 MG EC tablet Take 75 mg by mouth 2 (two) times daily.    . Flaxseed, Linseed, (FLAXSEED OIL) 1000 MG CAPS Take 1,000 mg by mouth daily.    Marland Kitchen ibuprofen (ADVIL,MOTRIN) 200 MG  tablet Take 400 mg by mouth every 8 (eight) hours as needed for mild pain.    Marland Kitchen lamoTRIgine (LAMICTAL) 150 MG tablet Take 150 mg by mouth 2 (two) times daily.     . Levetiracetam (KEPPRA XR) 750 MG TB24 Take 750 mg by mouth 2 (two) times daily.    . Multiple Vitamins-Minerals (CENTRUM ADULTS PO) Take 1 tablet by mouth daily.     Marland Kitchen omeprazole (PRILOSEC) 20 MG capsule Take 20 mg by mouth every morning.     . pyridOXINE (VITAMIN B-6) 100 MG tablet Take 100 mg by mouth daily.     . sertraline (ZOLOFT) 100 MG tablet Take 1 tablet (100 mg total) by mouth daily. 30 tablet 2  . traMADol (ULTRAM) 50 MG tablet Take by mouth.    . Vitamin A 3 MG (10000 UT) TABS Take 3,000 Units by mouth daily.     . vitamin B-12 (CYANOCOBALAMIN) 500 MCG tablet Take 500 mcg by mouth daily.     . vitamin C (ASCORBIC ACID) 500 MG tablet Take 500 mg by mouth daily.    . Vitamin E 200 units TABS Take 200 Units by mouth daily.     Marland Kitchen zonisamide (ZONEGRAN) 100 MG capsule Take 400 mg by mouth daily with supper.     No current facility-administered medications for this visit.      Musculoskeletal: Strength & Muscle Tone: UTA Gait & Station: normal Patient leans: N/A  Psychiatric Specialty Exam: Review of Systems  Psychiatric/Behavioral:  Negative for depression, hallucinations, substance abuse and suicidal ideas. The patient is not nervous/anxious and does not have insomnia.   All other systems reviewed and are negative.   There were no vitals taken for this visit.There is no height or weight on file to calculate BMI.  General Appearance: Casual  Eye Contact:  Fair  Speech:  Clear and Coherent  Volume:  Normal  Mood:  Euthymic  Affect:  Congruent  Thought Process:  Goal Directed and Descriptions of Associations: Intact  Orientation:  Full (Time, Place, and Person)  Thought Content: Logical   Suicidal Thoughts:  No  Homicidal Thoughts:  No  Memory:  Immediate;   Fair Recent;   Fair Remote;   Fair  Judgement:  Fair  Insight:  Fair  Psychomotor Activity:  Normal  Concentration:  Concentration: Fair and Attention Span: Fair  Recall:  [employer] of Knowledge: Fair  Language: Fair  Akathisia:  No  Handed:  Right  AIMS (if indicated): Denies tremors, rigidity  Assets:  Communication Skills Desire for Improvement Housing Social Support  ADL's:  Intact  Cognition: WNL  Sleep:  Fair   Screenings: PHQ2-9     Office Visit from 05/23/2019 in [place]  PHQ-2 Total Score  2  PHQ-9 Total Score  6       Assessment and Plan: Cindy Mcclure is a 52 year old Caucasian female, widowed, lives in [place], has a history of seizure disorder, MDD, GAD, degenerative disc disease, urinary incontinence, chronic pain was evaluated by telemedicine today.  Patient is biologically predisposed given her multiple medical problems, chronic pain as well as family history of mental health problems.  She also has psychosocial stressors of chronic health issues, relationship struggles.  Patient is currently making progress on the current medication regimen.  Plan as noted below.  Plan MDD-improving Zoloft 100 mg p.o. daily Continue CBT with Ms. Alden Hipp  GAD-improving Zoloft as prescribed  Follow-up in  clinic in 1 to 2 months or  sooner if needed.  February 11 at 4 PM  I have spent atleast 15 minutes non face to face with patient today. More than 50 % of the time was spent for psychoeducation and supportive psychotherapy and care coordination. This note was generated in part or whole with voice recognition software. Voice recognition is usually quite accurate but there are transcription errors that can and very often do occur. I apologize for any typographical errors that were not detected and corrected.       Ursula Alert, MD 07/26/2019, 5:37 PM

## 2019-07-27 ENCOUNTER — Ambulatory Visit (INDEPENDENT_AMBULATORY_CARE_PROVIDER_SITE_OTHER): Payer: 59 | Admitting: Licensed Clinical Social Worker

## 2019-07-27 ENCOUNTER — Encounter: Payer: Self-pay | Admitting: Licensed Clinical Social Worker

## 2019-07-27 ENCOUNTER — Other Ambulatory Visit: Payer: Self-pay

## 2019-07-27 DIAGNOSIS — F320 Major depressive disorder, single episode, mild: Secondary | ICD-10-CM | POA: Diagnosis not present

## 2019-07-27 DIAGNOSIS — F411 Generalized anxiety disorder: Secondary | ICD-10-CM

## 2019-07-27 NOTE — Progress Notes (Signed)
Virtual Visit via Video Note  I connected with Cindy Mcclure on 07/27/19 at 11:00 AM EST by a video enabled telemedicine application and verified that I am speaking with the correct person using two identifiers.   I discussed the limitations of evaluation and management by telemedicine and the availability of in person appointments. The patient expressed understanding and agreed to proceed.  I discussed the assessment and treatment plan with the patient. The patient was provided an opportunity to ask questions and all were answered. The patient agreed with the plan and demonstrated an understanding of the instructions.   The patient was advised to call back or seek an in-person evaluation if the symptoms worsen or if the condition fails to improve as anticipated.  I provided 30 minutes of non-face-to-face time during this encounter.   Alden Hipp, [name]    THERAPIST PROGRESS NOTE  Session Time: 1100  Participation Level: Active  Behavioral Response: NeatAlertAnxious  Type of Therapy: Individual Therapy  Treatment Goals addressed: Coping  Interventions: Supportive  Summary: Cindy Mcclure is a 53 y.o. female who presents with continued symptoms related to her diagnosis. Cindy Mcclure reports doing well since our last session. She reports her daughter has been more willing to talk to her, and more willing to include her in on things. [name] validated Cindy Mcclure's feelings and asked her what changed since our last talk. Cindy Mcclure reported she was unsure why this situation changed, but was happy that it had. She reported feeling her mother is still overly involved with her daughter, and shared insight on that during today's session. Cindy Mcclure reported, "My girlfriends and I were talking about it, and we think because she wasn't very involved with me growing up, that she might be trying to get that experience through my daughter." [name] validated this idea and asked if she'd spoken to her mother  about her thought. Cindy Mcclure stated she had not because she feels her daughter is always listening. [name] encouraged Cindy Mcclure to find a time to discuss this with her mother and ask her daughter to go into another room. [name] explained this could help facilitate bonding and closeness in her relationship with her mother. Prem expressed understanding and agreement with this information. [name] held space for Cindy Mcclure to discuss other things from her childhood that contributed to the conclusion about their relationship.   Suicidal/Homicidal: No  Therapist Response: Cindy Mcclure continues to work towards her tx goals but has not yet reached them.   Plan: Return again in 4 weeks.  Diagnosis: Axis I: Generalized Anxiety Disorder    Axis II: No diagnosis    Alden Hipp, [name] 07/27/2019

## 2019-08-23 ENCOUNTER — Other Ambulatory Visit: Payer: Self-pay | Admitting: Psychiatry

## 2019-08-23 DIAGNOSIS — F411 Generalized anxiety disorder: Secondary | ICD-10-CM

## 2019-08-23 DIAGNOSIS — F320 Major depressive disorder, single episode, mild: Secondary | ICD-10-CM

## 2019-08-29 ENCOUNTER — Encounter: Payer: Self-pay | Admitting: Licensed Clinical Social Worker

## 2019-08-29 ENCOUNTER — Ambulatory Visit (INDEPENDENT_AMBULATORY_CARE_PROVIDER_SITE_OTHER): Payer: 59 | Admitting: Licensed Clinical Social Worker

## 2019-08-29 ENCOUNTER — Other Ambulatory Visit: Payer: Self-pay

## 2019-08-29 DIAGNOSIS — F320 Major depressive disorder, single episode, mild: Secondary | ICD-10-CM | POA: Diagnosis not present

## 2019-08-29 DIAGNOSIS — F411 Generalized anxiety disorder: Secondary | ICD-10-CM

## 2019-08-29 NOTE — Progress Notes (Signed)
Virtual Visit via Telephone Note  I connected with Cindy Mcclure on 08/29/19 at 11:00 AM EST by telephone and verified that I am speaking with the correct person using two identifiers.   I discussed the limitations, risks, security and privacy concerns of performing an evaluation and management service by telephone and the availability of in person appointments. I also discussed with the patient that there may be a patient responsible charge related to this service. The patient expressed understanding and agreed to proceed.    I discussed the assessment and treatment plan with the patient. The patient was provided an opportunity to ask questions and all were answered. The patient agreed with the plan and demonstrated an understanding of the instructions.   The patient was advised to call back or seek an in-person evaluation if the symptoms worsen or if the condition fails to improve as anticipated.  I provided 30 minutes of non-face-to-face time during this encounter.   Cindy Hipp, [name]   THERAPIST PROGRESS NOTE  Session Time: 1100  Participation Level: Active  Behavioral Response: NeatAlertAnxious  Type of Therapy: Individual Therapy  Treatment Goals addressed: Coping  Interventions: Supportive  Summary: Cindy Mcclure is a 54 y.o. female who presents with continued symptoms related to her diagnosis. Cindy Mcclure reports doing well since our last session. She reports her mother, "told me some stuff that she thinks I'm doing wrong." Cindy Mcclure reported her mother told her that it was her attitude contributing to the space between [place], her mother, and her daughter. Cindy Mcclure was unable to understand what her mother meant by this, and expressed confusion. [name] encouraged Cindy Mcclure to talk to her mother more in depth, and ask questions when she does not understand the conversation or points her mother is making. Cindy Mcclure expressed understanding and agreement with this information as  well. Cindy Mcclure reported she started writing her mother a letter, as suggested by [name]. Cindy Mcclure reported the primary thing she's written about in her letter so far is her medication and how those are contributing negatively to her attitude. [name] encouraged Cindy Mcclure to continue writing the letter, and to think about how she words things as she says them. We discussed ways to improve communication within the letter in order to avoid her mother's response being emotional. Cindy Mcclure expressed understanding and agreement with this information. Cindy Mcclure also reported she was considering writing her daughter a [employer]. [name] validated this idea and encouraged Cindy Mcclure to do so.   Suicidal/Homicidal: No   Therapist Response: Cindy Mcclure continues to work towards her tx goals but has not yet reached them. We will continue to work on improving emotional regulation skills and communication moving forward.   Plan: Return again in 4 weeks.  Diagnosis: Axis I: Generalized Anxiety Disorder    Axis II: No diagnosis    Cindy Hipp, [name] 08/29/2019

## 2019-09-29 ENCOUNTER — Ambulatory Visit: Payer: 59 | Admitting: Licensed Clinical Social Worker

## 2019-09-29 ENCOUNTER — Ambulatory Visit (INDEPENDENT_AMBULATORY_CARE_PROVIDER_SITE_OTHER): Payer: 59 | Admitting: Psychiatry

## 2019-09-29 ENCOUNTER — Other Ambulatory Visit: Payer: Self-pay

## 2019-09-29 ENCOUNTER — Encounter: Payer: Self-pay | Admitting: Psychiatry

## 2019-09-29 DIAGNOSIS — F3341 Major depressive disorder, recurrent, in partial remission: Secondary | ICD-10-CM | POA: Diagnosis not present

## 2019-09-29 DIAGNOSIS — F411 Generalized anxiety disorder: Secondary | ICD-10-CM

## 2019-09-29 NOTE — Progress Notes (Signed)
Provider Location : [facility] Patient Location : Home  Virtual Visit via Video Note  I connected with Cindy Mcclure on 09/29/19 at  4:00 PM EST by a video enabled telemedicine application and verified that I am speaking with the correct person using two identifiers.   I discussed the limitations of evaluation and management by telemedicine and the availability of in person appointments. The patient expressed understanding and agreed to proceed.     I discussed the assessment and treatment plan with the patient. The patient was provided an opportunity to ask questions and all were answered. The patient agreed with the plan and demonstrated an understanding of the instructions.   The patient was advised to call back or seek an in-person evaluation if the symptoms worsen or if the condition fails to improve as anticipated.  [place] MD OP Progress Note  09/29/2019 5:15 PM Cindy Mcclure  MRN:  [id number]  Chief Complaint:  Chief Complaint    Follow-up     HPI: Cindy Mcclure is a 54 year old Caucasian female on disability, lives in [place], has a history of MDD, GAD, seizure disorder, urinary incontinence, degenerative disc disease was evaluated by telemedicine today.  Patient today reports she is currently making progress with regards to her depressive symptoms.  She is compliant on Zoloft as prescribed.  She is tolerating it well and denies side effects.  Patient reports she is sleeping through the night.  She denies any appetite changes.  She denies any suicidality, homicidality or perceptual disturbances.  She continues to have relationship struggles with her daughter and her mother however she reports she has been able to accept it better and is able to look at it differently than before.  That has helped her with her mood symptoms.  She continues to work with her therapist and reports therapy sessions is beneficial.  Visit Diagnosis:    ICD-10-CM   1. MDD (major depressive  disorder), recurrent, in partial remission ([place])  F33.41   2. GAD (generalized anxiety disorder)  F41.1     Past Psychiatric History: I have reviewed past psychiatric history from my progress note on 11/04/2018.  Past Medical History:  Past Medical History:  Diagnosis Date  . Allergic rhinitis   . Anginal pain [facility])    sees dr Clayborn Bigness  . Anxiety   . Arthritis    all over...knees, ankles, back  . Back pain   . Cervical radiculitis    no surgery, just cortisone shots.    . Constipation   . Constipation   . DDD (degenerative disc disease), cervical   . Dermatophytosis of nail 2019   both feet  . GERD (gastroesophageal reflux disease)   . History of kidney stones    h/o  . Osteoarthritis   . Panic attacks   . Plantar fasciitis    bilaterally  . Seizures ([place]) 1972   unsure of when she had last seizure due to petit mal type. does not know what brings on a seizure.  . Stress bladder incontinence, female   . Urinary incontinence     Past Surgical History:  Procedure Laterality Date  . CESAREAN SECTION  [id number]  . CHOLECYSTECTOMY N/A 12/06/2015   Procedure: LAPAROSCOPIC CHOLECYSTECTOMY WITH INTRAOPERATIVE CHOLANGIOGRAM;  Surgeon: Dia Crawford III, MD;  Location: [facility] ORS;  Service: General;  Laterality: N/A;  . COLONOSCOPY WITH PROPOFOL N/A 09/29/2016   Procedure: COLONOSCOPY WITH PROPOFOL;  Surgeon: Manya Silvas, MD;  Location: [facility] ENDOSCOPY;  Service: Endoscopy;  Laterality: N/A;  .  KNEE ARTHROSCOPY WITH EXCISION PLICA Left [id number]   Procedure: KNEE ARTHROSCOPY WITH EXCISION PLICA, PARTIAL SYNOVECTOMY;  Surgeon: Hessie Knows, MD;  Location: [facility] ORS;  Service: Orthopedics;  Laterality: Left;  . KNEE ARTHROSCOPY WITH MEDIAL MENISECTOMY Right 09/03/2017   Procedure: KNEE ARTHROSCOPY WITH MEDIAL MENISECTOMY, PARTIAL SYNOVECTOMY;  Surgeon: Hessie Knows, MD;  Location: [facility] ORS;  Service: Orthopedics;  Laterality: Right;  . SHOULDER ARTHROSCOPY WITH DEBRIDEMENT AND BICEP TENDON  REPAIR Left 03/22/2019   Procedure: SHOULDER ARTHROSCOPY WITH DEBRIDEMENT, ROTATOR CUFF REPAIR, AND BICEP TENDON REPAIR;  Surgeon: Corky Mull, MD;  Location: [facility] ORS;  Service: Orthopedics;  Laterality: Left;  . SUBACROMIAL DECOMPRESSION  03/22/2019   Procedure: SUBACROMIAL DECOMPRESSION;  Surgeon: Corky Mull, MD;  Location: [facility] ORS;  Service: Orthopedics;;  . SURGERY FOR SEIZURES     nothing implanted in head. she was 54 years old. done [facility]  . TUBAL LIGATION  2003    Family Psychiatric History: I have reviewed family psychiatric history from my progress note on 11/04/2018.  Family History:  Family History  Problem Relation Age of Onset  . Anxiety disorder Daughter     Social History: I have reviewed social history from my progress note on 11/04/2018. Social History   Socioeconomic History  . Marital status: Divorced    Spouse name: Not on file  . Number of children: Not on file  . Years of education: Not on file  . Highest education level: Not on file  Occupational History  . Not on file  Tobacco Use  . Smoking status: Former Smoker    Packs/day: 0.25    Types: Cigarettes    Quit date: 08/17/2012    Years since quitting: 7.1  . Smokeless tobacco: Never Used  . Tobacco comment: pt states she smoked intermittently-smokers in home  Substance and Sexual Activity  . Alcohol use: Yes    Comment: only on special occasions   . Drug use: No    Comment: denies  . Sexual activity: Not Currently  Other Topics Concern  . Not on file  Social History Narrative  . Not on file   Social Determinants of Health   Financial Resource Strain:   . Difficulty of Paying Living Expenses: Not on file  Food Insecurity:   . Worried About Charity fundraiser in the Last Year: Not on file  . Ran Out of Food in the Last Year: Not on file  Transportation Needs:   . Lack of Transportation (Medical): Not on file  . Lack of Transportation (Non-Medical): Not on file  Physical Activity:   .  Days of Exercise per Week: Not on file  . Minutes of Exercise per Session: Not on file  Stress:   . Feeling of Stress : Not on file  Social Connections:   . Frequency of Communication with Friends and Family: Not on file  . Frequency of Social Gatherings with Friends and Family: Not on file  . Attends Religious Services: Not on file  . Active Member of Clubs or Organizations: Not on file  . Attends Archivist Meetings: Not on file  . Marital Status: Not on file    Allergies: No Known Allergies  Metabolic Disorder Labs: No results found for: HGBA1C, MPG No results found for: PROLACTIN No results found for: CHOL, TRIG, HDL, CHOLHDL, VLDL, LDLCALC Lab Results  Component Value Date   TSH 1.930 11/04/2018    Therapeutic Level Labs: No results found for: LITHIUM No results found  for: VALPROATE No components found for:  CBMZ  Current Medications: Current Outpatient Medications  Medication Sig Dispense Refill  . ascorbic acid (VITAMIN C) 1000 MG tablet Take by mouth.    . Calcium Carb-Cholecalciferol (CALCIUM 600 + D PO) Take 1 tablet by mouth daily.    . cloBAZam (ONFI) 20 MG tablet Take 20 mg by mouth at bedtime.     . diclofenac (VOLTAREN) 75 MG EC tablet Take 75 mg by mouth 2 (two) times daily.    . Flaxseed, Linseed, (FLAXSEED OIL) 1000 MG CAPS Take 1,000 mg by mouth daily.    Marland Kitchen ibuprofen (ADVIL,MOTRIN) 200 MG tablet Take 400 mg by mouth every 8 (eight) hours as needed for mild pain.    Marland Kitchen lamoTRIgine (LAMICTAL) 150 MG tablet Take 150 mg by mouth 2 (two) times daily.     . Levetiracetam (KEPPRA XR) 750 MG TB24 Take 750 mg by mouth 2 (two) times daily.    . Multiple Vitamins-Minerals (CENTRUM ADULTS PO) Take 1 tablet by mouth daily.     Marland Kitchen omeprazole (PRILOSEC) 20 MG capsule Take 20 mg by mouth every morning.     . pyridOXINE (VITAMIN B-6) 100 MG tablet Take 100 mg by mouth daily.     . sertraline (ZOLOFT) 100 MG tablet TAKE 1 TABLET BY MOUTH ONCE DAILY 30 tablet 1  .  traMADol (ULTRAM) 50 MG tablet Take by mouth.    . Vitamin A 3 MG (10000 UT) TABS Take 3,000 Units by mouth daily.     . vitamin B-12 (CYANOCOBALAMIN) 500 MCG tablet Take 500 mcg by mouth daily.     . vitamin C (ASCORBIC ACID) 500 MG tablet Take 500 mg by mouth daily.    . Vitamin E 200 units TABS Take 200 Units by mouth daily.     Marland Kitchen zonisamide (ZONEGRAN) 100 MG capsule Take 400 mg by mouth daily with supper.     No current facility-administered medications for this visit.     Musculoskeletal: Strength & Muscle Tone: UTA Gait & Station: normal Patient leans: N/A  Psychiatric Specialty Exam: Review of Systems  Psychiatric/Behavioral: Negative for agitation, behavioral problems, confusion, decreased concentration, dysphoric mood, hallucinations, self-injury, sleep disturbance and suicidal ideas. The patient is not nervous/anxious and is not hyperactive.   All other systems reviewed and are negative.   There were no vitals taken for this visit.There is no height or weight on file to calculate BMI.  General Appearance: Casual  Eye Contact:  Fair  Speech:  Clear and Coherent  Volume:  Normal  Mood:  Euthymic  Affect:  Congruent  Thought Process:  Goal Directed and Descriptions of Associations: Intact  Orientation:  Full (Time, Place, and Person)  Thought Content: Logical   Suicidal Thoughts:  No  Homicidal Thoughts:  No  Memory:  Immediate;   Fair Recent;   Fair Remote;   Fair  Judgement:  Fair  Insight:  Fair  Psychomotor Activity:  Normal  Concentration:  Concentration: Fair and Attention Span: Fair  Recall:  [employer] of Knowledge: Fair  Language: Fair  Akathisia:  No  Handed:  Right  AIMS (if indicated):UTA  Assets:  Communication Skills Desire for Improvement Housing Social Support  ADL's:  Intact  Cognition: WNL  Sleep:  Fair   Screenings: PHQ2-9     Office Visit from 05/23/2019 in [place]  PHQ-2 Total Score  2  PHQ-9 Total  Score  6       Assessment  and Plan: Marnice is a 54 year old Caucasian female, widowed, lives in [place], has a history of seizure disorder, MDD, GAD, degenerative disc disease, urinary incontinence, chronic pain was evaluated by telemedicine today.  Patient is biologically predisposed given her multiple medical problems, chronic pain as well as family history of mental health problems.  She also has psychosocial stressors of chronic health issues and relationship struggles.  Patient is currently making progress with regards to her depression.  Plan as noted below.  Plan  MDD in partial remission Zoloft 100 mg p.o. daily Continue CBT with Ms. Alden Hipp  GAD-improving Zoloft as prescribed  Follow-up in clinic in 2 months or sooner if needed.  April 22 at 4 PM  I have spent atleast 20 minutes non face to face with patient today. More than 50 % of the time was spent for ordering medications and test ,psychoeducation and supportive psychotherapy and care coordination,as well as documenting clinical information in electronic health record. This note was generated in part or whole with voice recognition software. Voice recognition is usually quite accurate but there are transcription errors that can and very often do occur. I apologize for any typographical errors that were not detected and corrected.       Ursula Alert, MD 09/29/2019, 5:15 PM

## 2019-10-14 ENCOUNTER — Ambulatory Visit (INDEPENDENT_AMBULATORY_CARE_PROVIDER_SITE_OTHER): Payer: 59 | Admitting: Licensed Clinical Social Worker

## 2019-10-14 ENCOUNTER — Other Ambulatory Visit: Payer: Self-pay

## 2019-10-14 ENCOUNTER — Encounter: Payer: Self-pay | Admitting: Licensed Clinical Social Worker

## 2019-10-14 DIAGNOSIS — F3341 Major depressive disorder, recurrent, in partial remission: Secondary | ICD-10-CM | POA: Diagnosis not present

## 2019-10-14 NOTE — Progress Notes (Signed)
Virtual Visit via Telephone Note  I connected with Cindy Mcclure on 10/14/19 at 11:00 AM EST by telephone and verified that I am speaking with the correct person using two identifiers.   I discussed the limitations, risks, security and privacy concerns of performing an evaluation and management service by telephone and the availability of in person appointments. I also discussed with the patient that there may be a patient responsible charge related to this service. The patient expressed understanding and agreed to proceed.  I discussed the assessment and treatment plan with the patient. The patient was provided an opportunity to ask questions and all were answered. The patient agreed with the plan and demonstrated an understanding of the instructions.   The patient was advised to call back or seek an in-person evaluation if the symptoms worsen or if the condition fails to improve as anticipated.  I provided 30  minutes of non-face-to-face time during this encounter.   Alden Hipp, [name]    THERAPIST PROGRESS NOTE  Session Time: 1100  Participation Level: Active  Behavioral Response: NeatAlertAnxious  Type of Therapy: Individual Therapy  Treatment Goals addressed: Coping  Interventions: Supportive  Summary: Cindy Mcclure is a 54 y.o. female who presents with continued symptoms related to her diagnosis. Cindy Mcclure reports doing well since our last session. She reports her daughter and mother have been more inclusive recently, and she has felt more comfortable around them. However, Cindy Mcclure reports feeling her daughter is still being disrespectful towards her. She reports her daughter will tell her she does not have class and then Cindy Mcclure will find out she actually did have class. [name] validated Cindy Mcclure's feelings and encouraged her to set more firm boundaries with her daughter in an effort to regain authority in the home. Cindy Mcclure expressed understanding and agreement. [name] held  space for Cindy Mcclure to discuss other issues around her daughter and her mother. We reviewed assertive communication and boundary setting, and how she could best do that moving forward.   Suicidal/Homicidal: No   Therapist Response: Cindy Mcclure continues to work towards her tx goals but has not yet reached them. We will continue to work on improving emotional regulation skills and distress tolerance. We will also continue to work on improving communication moving forward.   Plan: Return again in 2 weeks.  Diagnosis: Axis I: MDD    Axis II: No diagnosis    Alden Hipp, [name] 10/14/2019

## 2019-11-10 ENCOUNTER — Ambulatory Visit (INDEPENDENT_AMBULATORY_CARE_PROVIDER_SITE_OTHER): Payer: 59 | Admitting: Licensed Clinical Social Worker

## 2019-11-10 ENCOUNTER — Other Ambulatory Visit: Payer: Self-pay

## 2019-11-10 ENCOUNTER — Encounter: Payer: Self-pay | Admitting: Licensed Clinical Social Worker

## 2019-11-10 DIAGNOSIS — F3341 Major depressive disorder, recurrent, in partial remission: Secondary | ICD-10-CM

## 2019-11-10 DIAGNOSIS — F411 Generalized anxiety disorder: Secondary | ICD-10-CM | POA: Diagnosis not present

## 2019-11-10 NOTE — Progress Notes (Signed)
Virtual Visit via Telephone Note  I connected with Cindy Mcclure on 11/10/19 at 10:00 AM EDT by telephone and verified that I am speaking with the correct person using two identifiers.   I discussed the limitations, risks, security and privacy concerns of performing an evaluation and management service by telephone and the availability of in person appointments. I also discussed with the patient that there may be a patient responsible charge related to this service. The patient expressed understanding and agreed to proceed.  I discussed the assessment and treatment plan with the patient. The patient was provided an opportunity to ask questions and all were answered. The patient agreed with the plan and demonstrated an understanding of the instructions.   The patient was advised to call back or seek an in-person evaluation if the symptoms worsen or if the condition fails to improve as anticipated.  I provided 30 minutes of non-face-to-face time during this encounter.   Alden Hipp, [name]   THERAPIST PROGRESS NOTE  Session Time: 1000  Participation Level: Active  Behavioral Response: CasualAlertAnxious  Type of Therapy: Individual Therapy  Treatment Goals addressed: Coping  Interventions: Solution Focused and Supportive  Summary: Cindy Mcclure is a 54 y.o. female who presents with continued symptoms related to her diagnosis. Cindy Mcclure reports doing well sine our last session, but notes she has experienced continued anxiety and depression around her relationship with her daughter and mother. "They finally told me that one of the reasons my daughter doesn't talk to me as much is because of how things were when we lived in the apartments." Cindy Mcclure noted that was around 6 years ago. [name] validated Cindy Mcclure's feelings and encouraged her to speak openly with her daughter and mother, all together, in the same room, to get everything out on the table. We discussed ways Cindy Mcclure could  utilize assertive communication to appropriately initiate this conversation. Cindy Mcclure expressed understanding and agreement with all information presented.   Suicidal/Homicidal: No  Therapist Response: Cindy Mcclure continues to work towards her tx goals but has not yet reached them. We will continue to work on improving emotional regulation skills and distress tolerance moving forward.   Plan: Return again in 2 weeks.  Diagnosis: Axis I: MDD    Axis II: No diagnosis    Alden Hipp, [name] 11/10/2019

## 2019-11-29 ENCOUNTER — Other Ambulatory Visit: Payer: Self-pay

## 2019-11-29 ENCOUNTER — Ambulatory Visit (INDEPENDENT_AMBULATORY_CARE_PROVIDER_SITE_OTHER): Payer: 59 | Admitting: Licensed Clinical Social Worker

## 2019-11-29 ENCOUNTER — Encounter: Payer: Self-pay | Admitting: Licensed Clinical Social Worker

## 2019-11-29 DIAGNOSIS — F3341 Major depressive disorder, recurrent, in partial remission: Secondary | ICD-10-CM | POA: Diagnosis not present

## 2019-11-29 NOTE — Progress Notes (Signed)
Virtual Visit via Video Note  I connected with Cindy Mcclure on 11/29/19 at 12:30 PM EDT by a video enabled telemedicine application and verified that I am speaking with the correct person using two identifiers.   I discussed the limitations of evaluation and management by telemedicine and the availability of in person appointments. The patient expressed understanding and agreed to proceed.    I discussed the assessment and treatment plan with the patient. The patient was provided an opportunity to ask questions and all were answered. The patient agreed with the plan and demonstrated an understanding of the instructions.   The patient was advised to call back or seek an in-person evaluation if the symptoms worsen or if the condition fails to improve as anticipated.  I provided 45 minutes of non-face-to-face time during this encounter.   Alden Hipp, [name]    THERAPIST PROGRESS NOTE  Session Time: 1230  Participation Level: Active  Behavioral Response: CasualAlertAnxious  Type of Therapy: Individual Therapy  Treatment Goals addressed: Coping  Interventions: Supportive  Summary: MOSSIE WOWK is a 54 y.o. female who presents with continued symptoms related to her diagnosis. Samyuktha reports doing well since our last session, but noted ongoing problems within her relationships with her daughter and her mother. Kyley expressed still feeling left out, and still feeling her mother favors her daughter. [name] encouraged Cydni to continue bringing up the things that bother her, and continue utilizing assertive communication to ensure she is appropriately conveying her needs to her family members. [name] held space for Tegan to discuss what aspects of the relationship that bothered her, and what she planned to do about it. [name] offered insight where appropriate, and encouraged Esmie to lean into her emotions that make her feel uncomfortable by talking to her daughter and mother  about them. Abishai expressed understanding and agreement.    Suicidal/Homicidal: No  Therapist Response: Shaniquia continues to work towards her tx goals but has not yet reached them. We will continue to work on improving emotional regulation and distress tolerance moving forward.   Plan: Return again in 2 weeks.  Diagnosis: Axis I: MDD    Axis II: No diagnosis    Alden Hipp, [name] 11/29/2019

## 2019-12-08 ENCOUNTER — Encounter: Payer: Self-pay | Admitting: Psychiatry

## 2019-12-08 ENCOUNTER — Telehealth (INDEPENDENT_AMBULATORY_CARE_PROVIDER_SITE_OTHER): Payer: 59 | Admitting: Psychiatry

## 2019-12-08 ENCOUNTER — Other Ambulatory Visit: Payer: Self-pay

## 2019-12-08 DIAGNOSIS — F3342 Major depressive disorder, recurrent, in full remission: Secondary | ICD-10-CM

## 2019-12-08 DIAGNOSIS — F411 Generalized anxiety disorder: Secondary | ICD-10-CM

## 2019-12-08 MED ORDER — SERTRALINE HCL 100 MG PO TABS: 100.0000 mg | ORAL_TABLET | Freq: Every day | ORAL | 1 refills | Status: DC

## 2019-12-08 NOTE — Patient Instructions (Signed)
Follow-up in clinic June 8, 10:40 AM

## 2019-12-08 NOTE — Progress Notes (Signed)
Provider Location : [facility] Patient Location : Home  Virtual Visit via Video Note  I connected with Cindy Mcclure on 12/08/19 at  4:00 PM EDT by a video enabled telemedicine application and verified that I am speaking with the correct person using two identifiers.   I discussed the limitations of evaluation and management by telemedicine and the availability of in person appointments. The patient expressed understanding and agreed to proceed.   I discussed the assessment and treatment plan with the patient. The patient was provided an opportunity to ask questions and all were answered. The patient agreed with the plan and demonstrated an understanding of the instructions.   The patient was advised to call back or seek an in-person evaluation if the symptoms worsen or if the condition fails to improve as anticipated.  [place] MD OP Progress Note  12/08/2019 5:27 PM Cindy Mcclure  MRN:  [id number]  Chief Complaint:  Chief Complaint    Follow-up     HPI: Cindy Mcclure is a 54 year old Caucasian female on disability, lives in [place], has a history of MDD, GAD, seizure disorder, urinary incontinence, degenerative disc disease was evaluated by telemedicine today.  Patient today reports she continues to have relationship struggles with her mother and daughter.  She reports she however was able to take a vacation with them recently.  She reports she is compliant on the Zoloft.  She denies side effects.  She describes her mood symptoms are stable.  She reports sleep and appetite as good.  She continues to want to have psychotherapy sessions more so because of her relationship struggles.  We will encourage the same.  She denies suicidality, homicidality or perceptual disturbances. Visit Diagnosis:    ICD-10-CM   1. MDD (major depressive disorder), recurrent, in full remission ([place])  F33.42 sertraline (ZOLOFT) 100 MG tablet  2. GAD (generalized anxiety disorder)  F41.1 sertraline (ZOLOFT) 100  MG tablet    Past Psychiatric History: I have reviewed past psychiatric history from my progress note on 11/04/2018  Past Medical History:  Past Medical History:  Diagnosis Date  . Allergic rhinitis   . Anginal pain [facility])    sees dr Clayborn Bigness  . Anxiety   . Arthritis    all over...knees, ankles, back  . Back pain   . Cervical radiculitis    no surgery, just cortisone shots.    . Constipation   . Constipation   . DDD (degenerative disc disease), cervical   . Dermatophytosis of nail 2019   both feet  . GERD (gastroesophageal reflux disease)   . History of kidney stones    h/o  . Osteoarthritis   . Panic attacks   . Plantar fasciitis    bilaterally  . Seizures ([place]) 1972   unsure of when she had last seizure due to petit mal type. does not know what brings on a seizure.  . Stress bladder incontinence, female   . Urinary incontinence     Past Surgical History:  Procedure Laterality Date  . CESAREAN SECTION  [id number]  . CHOLECYSTECTOMY N/A 12/06/2015   Procedure: LAPAROSCOPIC CHOLECYSTECTOMY WITH INTRAOPERATIVE CHOLANGIOGRAM;  Surgeon: Dia Crawford III, MD;  Location: [facility] ORS;  Service: General;  Laterality: N/A;  . COLONOSCOPY WITH PROPOFOL N/A 09/29/2016   Procedure: COLONOSCOPY WITH PROPOFOL;  Surgeon: Manya Silvas, MD;  Location: [facility] ENDOSCOPY;  Service: Endoscopy;  Laterality: N/A;  . KNEE ARTHROSCOPY WITH EXCISION PLICA Left [id number]   Procedure: KNEE ARTHROSCOPY WITH EXCISION PLICA, PARTIAL SYNOVECTOMY;  Surgeon:  Hessie Knows, MD;  Location: [facility] ORS;  Service: Orthopedics;  Laterality: Left;  . KNEE ARTHROSCOPY WITH MEDIAL MENISECTOMY Right 09/03/2017   Procedure: KNEE ARTHROSCOPY WITH MEDIAL MENISECTOMY, PARTIAL SYNOVECTOMY;  Surgeon: Hessie Knows, MD;  Location: [facility] ORS;  Service: Orthopedics;  Laterality: Right;  . SHOULDER ARTHROSCOPY WITH DEBRIDEMENT AND BICEP TENDON REPAIR Left 03/22/2019   Procedure: SHOULDER ARTHROSCOPY WITH DEBRIDEMENT, ROTATOR CUFF REPAIR,  AND BICEP TENDON REPAIR;  Surgeon: Corky Mull, MD;  Location: [facility] ORS;  Service: Orthopedics;  Laterality: Left;  . SUBACROMIAL DECOMPRESSION  03/22/2019   Procedure: SUBACROMIAL DECOMPRESSION;  Surgeon: Corky Mull, MD;  Location: [facility] ORS;  Service: Orthopedics;;  . SURGERY FOR SEIZURES     nothing implanted in head. she was 54 years old. done at [facility]  . TUBAL LIGATION  2003    Family Psychiatric History: Reviewed family psychiatric history from my progress note on 11/04/2018  Family History:  Family History  Problem Relation Age of Onset  . Anxiety disorder Daughter     Social History: Reviewed social history from my progress note on 11/04/2018 Social History   Socioeconomic History  . Marital status: Divorced    Spouse name: Not on file  . Number of children: Not on file  . Years of education: Not on file  . Highest education level: Not on file  Occupational History  . Not on file  Tobacco Use  . Smoking status: Former Smoker    Packs/day: 0.25    Types: Cigarettes    Quit date: 08/17/2012    Years since quitting: 7.3  . Smokeless tobacco: Never Used  . Tobacco comment: pt states she smoked intermittently-smokers in home  Substance and Sexual Activity  . Alcohol use: Yes    Comment: only on special occasions   . Drug use: No    Comment: denies  . Sexual activity: Not Currently  Other Topics Concern  . Not on file  Social History Narrative  . Not on file   Social Determinants of Health   Financial Resource Strain:   . Difficulty of Paying Living Expenses:   Food Insecurity:   . Worried About Charity fundraiser in the Last Year:   . Arboriculturist in the Last Year:   Transportation Needs:   . Film/video editor (Medical):   Marland Kitchen Lack of Transportation (Non-Medical):   Physical Activity:   . Days of Exercise per Week:   . Minutes of Exercise per Session:   Stress:   . Feeling of Stress :   Social Connections:   . Frequency of Communication with  Friends and Family:   . Frequency of Social Gatherings with Friends and Family:   . Attends Religious Services:   . Active Member of Clubs or Organizations:   . Attends Archivist Meetings:   Marland Kitchen Marital Status:     Allergies: No Known Allergies  Metabolic Disorder Labs: No results found for: HGBA1C, MPG No results found for: PROLACTIN No results found for: CHOL, TRIG, HDL, CHOLHDL, VLDL, LDLCALC Lab Results  Component Value Date   TSH 1.930 11/04/2018    Therapeutic Level Labs: No results found for: LITHIUM No results found for: VALPROATE No components found for:  CBMZ  Current Medications: Current Outpatient Medications  Medication Sig Dispense Refill  . ascorbic acid (VITAMIN C) 1000 MG tablet Take by mouth.    . Calcium Carb-Cholecalciferol (CALCIUM 600 + D PO) Take 1 tablet by mouth daily.    Marland Kitchen  cloBAZam (ONFI) 20 MG tablet Take 20 mg by mouth at bedtime.     . diclofenac (VOLTAREN) 75 MG EC tablet Take 75 mg by mouth 2 (two) times daily.    . Flaxseed, Linseed, (FLAXSEED OIL) 1000 MG CAPS Take 1,000 mg by mouth daily.    Marland Kitchen ibuprofen (ADVIL,MOTRIN) 200 MG tablet Take 400 mg by mouth every 8 (eight) hours as needed for mild pain.    Marland Kitchen lamoTRIgine (LAMICTAL) 150 MG tablet Take 150 mg by mouth 2 (two) times daily.     . Levetiracetam (KEPPRA XR) 750 MG TB24 Take 750 mg by mouth 2 (two) times daily.    . Multiple Vitamins-Minerals (CENTRUM ADULTS PO) Take 1 tablet by mouth daily.     Marland Kitchen omeprazole (PRILOSEC) 20 MG capsule Take 20 mg by mouth every morning.     . pyridOXINE (VITAMIN B-6) 100 MG tablet Take 100 mg by mouth daily.     . sertraline (ZOLOFT) 100 MG tablet Take 1 tablet (100 mg total) by mouth daily. 30 tablet 1  . traMADol (ULTRAM) 50 MG tablet Take by mouth.    . Vitamin A 3 MG (10000 UT) TABS Take 3,000 Units by mouth daily.     . vitamin B-12 (CYANOCOBALAMIN) 500 MCG tablet Take 500 mcg by mouth daily.     . vitamin C (ASCORBIC ACID) 500 MG tablet Take  500 mg by mouth daily.    . Vitamin E 200 units TABS Take 200 Units by mouth daily.     Marland Kitchen zonisamide (ZONEGRAN) 100 MG capsule Take 400 mg by mouth daily with supper.     No current facility-administered medications for this visit.     Musculoskeletal: Strength & Muscle Tone: UTA Gait & Station: normal Patient leans: N/A  Psychiatric Specialty Exam: Review of Systems  Psychiatric/Behavioral: Negative for agitation, behavioral problems, confusion, decreased concentration, dysphoric mood, hallucinations, self-injury, sleep disturbance and suicidal ideas. The patient is not nervous/anxious and is not hyperactive.   All other systems reviewed and are negative.   There were no vitals taken for this visit.There is no height or weight on file to calculate BMI.  General Appearance: Casual  Eye Contact:  Fair  Speech:  Clear and Coherent  Volume:  Normal  Mood:  Euthymic  Affect:  Congruent  Thought Process:  Goal Directed and Descriptions of Associations: Intact  Orientation:  Full (Time, Place, and Person)  Thought Content: Logical   Suicidal Thoughts:  No  Homicidal Thoughts:  No  Memory:  Immediate;   Fair Recent;   Fair Remote;   Fair  Judgement:  Fair  Insight:  Fair  Psychomotor Activity:  Normal  Concentration:  Concentration: Fair and Attention Span: Fair  Recall:  [employer] of Knowledge: Fair  Language: Fair  Akathisia:  No  Handed:  Right  AIMS (if indicated): UTA  Assets:  Communication Skills Desire for Improvement Housing Social Support  ADL's:  Intact  Cognition: WNL  Sleep:  Fair   Screenings: PHQ2-9     Office Visit from 05/23/2019 in [facility]  PHQ-2 Total Score  2  PHQ-9 Total Score  6       Assessment and Plan: Cindy Mcclure is a 54 year old Caucasian female, widowed, lives in [place], has a history of seizure disorder, MDD, GAD, degenerative disc disease, urinary incontinence, chronic pain was evaluated by  telemedicine today.  She is biologically predisposed given her multiple medical problems, chronic pain as well as family history  of mental health problems.  Patient with psychosocial stressors of chronic health issues and relationship struggles.  Patient is currently stable on current medication regimen however will continue to benefit from psychotherapy sessions for her relationship struggles.  Plan as noted below.  Plan MDD in  Remission Zoloft 100 mg p.o. daily Continue CBT.  She is aware that her therapist has left the practice and will establish care with a new therapist.  GAD-improving Zoloft as prescribed  Follow-up in clinic in 6 weeks or sooner if needed.  I have spent atleast 20 minutes non  face to face with patient today. More than 50 % of the time was spent for preparing to see the patient ( e.g., review of test, records ), ordering medications and test ,psychoeducation and supportive psychotherapy and care coordination,as well as documenting clinical information in electronic health record. This note was generated in part or whole with voice recognition software. Voice recognition is usually quite accurate but there are transcription errors that can and very often do occur. I apologize for any typographical errors that were not detected and corrected.        Ursula Alert, MD 12/08/2019, 5:27 PM

## 2019-12-19 ENCOUNTER — Other Ambulatory Visit: Payer: Self-pay | Admitting: Psychiatry

## 2019-12-19 DIAGNOSIS — F3342 Major depressive disorder, recurrent, in full remission: Secondary | ICD-10-CM

## 2019-12-19 DIAGNOSIS — F411 Generalized anxiety disorder: Secondary | ICD-10-CM

## 2019-12-22 ENCOUNTER — Inpatient Hospital Stay (HOSPITAL_COMMUNITY): Payer: 59

## 2019-12-22 ENCOUNTER — Inpatient Hospital Stay (HOSPITAL_COMMUNITY): Payer: 59 | Admitting: Anesthesiology

## 2019-12-22 ENCOUNTER — Emergency Department (HOSPITAL_COMMUNITY): Payer: 59

## 2019-12-22 ENCOUNTER — Encounter (HOSPITAL_COMMUNITY)
Admission: EM | Disposition: A | Discharge status: Skilled Nursing Facility | Payer: Self-pay | Source: Home / Self Care | Attending: Student

## 2019-12-22 ENCOUNTER — Encounter (HOSPITAL_COMMUNITY): Payer: Self-pay | Admitting: Emergency Medicine

## 2019-12-22 ENCOUNTER — Inpatient Hospital Stay (HOSPITAL_COMMUNITY)
Admission: EM | Admit: 2019-12-22 | Discharge: 2020-01-04 | DRG: 493 | Disposition: A | Discharge status: Skilled Nursing Facility | Payer: 59 | Attending: Student | Admitting: Student

## 2019-12-22 ENCOUNTER — Other Ambulatory Visit: Payer: Self-pay

## 2019-12-22 DIAGNOSIS — Z20822 Contact with and (suspected) exposure to covid-19: Secondary | ICD-10-CM | POA: Diagnosis present

## 2019-12-22 DIAGNOSIS — F329 Major depressive disorder, single episode, unspecified: Secondary | ICD-10-CM | POA: Diagnosis present

## 2019-12-22 DIAGNOSIS — F411 Generalized anxiety disorder: Secondary | ICD-10-CM | POA: Diagnosis present

## 2019-12-22 DIAGNOSIS — I209 Angina pectoris, unspecified: Secondary | ICD-10-CM | POA: Diagnosis present

## 2019-12-22 DIAGNOSIS — I340 Nonrheumatic mitral (valve) insufficiency: Secondary | ICD-10-CM | POA: Diagnosis present

## 2019-12-22 DIAGNOSIS — G40109 Localization-related (focal) (partial) symptomatic epilepsy and epileptic syndromes with simple partial seizures, not intractable, without status epilepticus: Secondary | ICD-10-CM | POA: Diagnosis present

## 2019-12-22 DIAGNOSIS — S92001A Unspecified fracture of right calcaneus, initial encounter for closed fracture: Secondary | ICD-10-CM

## 2019-12-22 DIAGNOSIS — Z87891 Personal history of nicotine dependence: Secondary | ICD-10-CM

## 2019-12-22 DIAGNOSIS — T1490XA Injury, unspecified, initial encounter: Secondary | ICD-10-CM | POA: Diagnosis present

## 2019-12-22 DIAGNOSIS — Z87442 Personal history of urinary calculi: Secondary | ICD-10-CM | POA: Diagnosis not present

## 2019-12-22 DIAGNOSIS — Z9049 Acquired absence of other specified parts of digestive tract: Secondary | ICD-10-CM

## 2019-12-22 DIAGNOSIS — R400 Somnolence: Secondary | ICD-10-CM | POA: Diagnosis not present

## 2019-12-22 DIAGNOSIS — S82839A Other fracture of upper and lower end of unspecified fibula, initial encounter for closed fracture: Secondary | ICD-10-CM

## 2019-12-22 DIAGNOSIS — S82831A Other fracture of upper and lower end of right fibula, initial encounter for closed fracture: Secondary | ICD-10-CM | POA: Diagnosis present

## 2019-12-22 DIAGNOSIS — M549 Dorsalgia, unspecified: Secondary | ICD-10-CM | POA: Diagnosis present

## 2019-12-22 DIAGNOSIS — S2231XA Fracture of one rib, right side, initial encounter for closed fracture: Secondary | ICD-10-CM | POA: Diagnosis present

## 2019-12-22 DIAGNOSIS — M1990 Unspecified osteoarthritis, unspecified site: Secondary | ICD-10-CM | POA: Diagnosis present

## 2019-12-22 DIAGNOSIS — W11XXXA Fall on and from ladder, initial encounter: Secondary | ICD-10-CM

## 2019-12-22 DIAGNOSIS — K219 Gastro-esophageal reflux disease without esophagitis: Secondary | ICD-10-CM | POA: Diagnosis present

## 2019-12-22 DIAGNOSIS — S82152A Displaced fracture of left tibial tuberosity, initial encounter for closed fracture: Secondary | ICD-10-CM | POA: Diagnosis present

## 2019-12-22 DIAGNOSIS — G8929 Other chronic pain: Secondary | ICD-10-CM | POA: Diagnosis present

## 2019-12-22 DIAGNOSIS — T148XXA Other injury of unspecified body region, initial encounter: Secondary | ICD-10-CM

## 2019-12-22 DIAGNOSIS — W19XXXA Unspecified fall, initial encounter: Secondary | ICD-10-CM

## 2019-12-22 DIAGNOSIS — S82832A Other fracture of upper and lower end of left fibula, initial encounter for closed fracture: Secondary | ICD-10-CM | POA: Diagnosis present

## 2019-12-22 DIAGNOSIS — M5010 Cervical disc disorder with radiculopathy, unspecified cervical region: Secondary | ICD-10-CM | POA: Diagnosis present

## 2019-12-22 DIAGNOSIS — Z79899 Other long term (current) drug therapy: Secondary | ICD-10-CM | POA: Diagnosis not present

## 2019-12-22 DIAGNOSIS — M810 Age-related osteoporosis without current pathological fracture: Secondary | ICD-10-CM | POA: Diagnosis present

## 2019-12-22 DIAGNOSIS — D62 Acute posthemorrhagic anemia: Secondary | ICD-10-CM | POA: Diagnosis not present

## 2019-12-22 DIAGNOSIS — S92061A Displaced intraarticular fracture of right calcaneus, initial encounter for closed fracture: Secondary | ICD-10-CM | POA: Diagnosis present

## 2019-12-22 DIAGNOSIS — Z419 Encounter for procedure for purposes other than remedying health state, unspecified: Secondary | ICD-10-CM

## 2019-12-22 DIAGNOSIS — S82142A Displaced bicondylar fracture of left tibia, initial encounter for closed fracture: Principal | ICD-10-CM | POA: Diagnosis present

## 2019-12-22 HISTORY — PX: EXTERNAL FIXATION LEG: SHX1549

## 2019-12-22 LAB — RESPIRATORY PANEL BY RT PCR (FLU A&B, COVID)
Influenza A by PCR: NEGATIVE
Influenza B by PCR: NEGATIVE
SARS Coronavirus 2 by RT PCR: NEGATIVE

## 2019-12-22 LAB — COMPREHENSIVE METABOLIC PANEL
ALT: 20 U/L (ref 0–44)
AST: 27 U/L (ref 15–41)
Albumin: 3.7 g/dL (ref 3.5–5.0)
Alkaline Phosphatase: 96 U/L (ref 38–126)
Anion gap: 7 (ref 5–15)
BUN: 20 mg/dL (ref 6–20)
CO2: 23 mmol/L (ref 22–32)
Calcium: 8.4 mg/dL — ABNORMAL LOW (ref 8.9–10.3)
Chloride: 108 mmol/L (ref 98–111)
Creatinine, Ser: 1.17 mg/dL — ABNORMAL HIGH (ref 0.44–1.00)
GFR calc Af Amer: >60 mL/min (ref >60)
GFR calc non Af Amer: 52 mL/min — ABNORMAL LOW (ref >60)
Glucose, Bld: 136 mg/dL — ABNORMAL HIGH (ref 70–99)
Potassium: 3.8 mmol/L (ref 3.5–5.1)
Sodium: 138 mmol/L (ref 135–145)
Total Bilirubin: 0.5 mg/dL (ref 0.3–1.2)
Total Protein: 6.7 g/dL (ref 6.5–8.1)

## 2019-12-22 LAB — CBC
HCT: 37.4 % (ref 36.0–46.0)
Hemoglobin: 11.9 g/dL — ABNORMAL LOW (ref 12.0–15.0)
MCH: 30.7 pg (ref 26.0–34.0)
MCHC: 31.8 g/dL (ref 30.0–36.0)
MCV: 96.6 fL (ref 80.0–100.0)
Platelets: 375 10*3/uL (ref 150–400)
RBC: 3.87 MIL/uL (ref 3.87–5.11)
RDW: 13.3 % (ref 11.5–15.5)
WBC: 18.1 10*3/uL — ABNORMAL HIGH (ref 4.0–10.5)
nRBC: 0 % (ref 0.0–0.2)

## 2019-12-22 LAB — HIV ANTIBODY (ROUTINE TESTING W REFLEX): HIV Screen 4th Generation wRfx: NONREACTIVE

## 2019-12-22 SURGERY — EXTERNAL FIXATION, LOWER EXTREMITY
Anesthesia: General | Site: Leg Lower | Laterality: Left

## 2019-12-22 MED ORDER — METHOCARBAMOL 500 MG PO TABS: 500.0000 mg | ORAL_TABLET | Freq: Four times a day (QID) | ORAL | Status: DC | PRN

## 2019-12-22 MED ORDER — PANTOPRAZOLE SODIUM 40 MG PO TBEC
40.0000 mg | DELAYED_RELEASE_TABLET | Freq: Every day | ORAL | Status: DC
  Administered 2019-12-23 – 2020-01-04 (×12): 40 mg via ORAL
  Filled 2019-12-22 (×12): qty 1

## 2019-12-22 MED ORDER — OXYCODONE HCL 5 MG PO TABS: 5.0000 mg | ORAL_TABLET | ORAL | Status: DC | PRN

## 2019-12-22 MED ORDER — SUCCINYLCHOLINE CHLORIDE 200 MG/10ML IV SOSY
PREFILLED_SYRINGE | INTRAVENOUS | Status: AC
  Filled 2019-12-22: qty 10

## 2019-12-22 MED ORDER — IOHEXOL 300 MG/ML  SOLN
100.0000 mL | Freq: Once | INTRAMUSCULAR | Status: AC | PRN
  Administered 2019-12-22: 100 mL via INTRAVENOUS

## 2019-12-22 MED ORDER — FENTANYL CITRATE (PF) 250 MCG/5ML IJ SOLN
INTRAMUSCULAR | Status: AC
  Filled 2019-12-22: qty 5

## 2019-12-22 MED ORDER — LIDOCAINE 2% (20 MG/ML) 5 ML SYRINGE
INTRAMUSCULAR | Status: AC
  Filled 2019-12-22: qty 5

## 2019-12-22 MED ORDER — PROPOFOL 10 MG/ML IV BOLUS
INTRAVENOUS | Status: AC
  Filled 2019-12-22: qty 20

## 2019-12-22 MED ORDER — ROCURONIUM BROMIDE 10 MG/ML (PF) SYRINGE
PREFILLED_SYRINGE | INTRAVENOUS | Status: AC
  Filled 2019-12-22: qty 10

## 2019-12-22 MED ORDER — ONDANSETRON HCL 4 MG/2ML IJ SOLN
INTRAMUSCULAR | Status: AC
  Filled 2019-12-22: qty 2

## 2019-12-22 MED ORDER — EPHEDRINE 5 MG/ML INJ
INTRAVENOUS | Status: AC
  Filled 2019-12-22: qty 10

## 2019-12-22 MED ORDER — LACTATED RINGERS IV SOLN: INTRAVENOUS | Status: DC | PRN

## 2019-12-22 MED ORDER — ONDANSETRON HCL 4 MG/2ML IJ SOLN
4.0000 mg | Freq: Four times a day (QID) | INTRAMUSCULAR | Status: DC | PRN
  Administered 2019-12-22: 4 mg via INTRAVENOUS
  Filled 2019-12-22: qty 2

## 2019-12-22 MED ORDER — LAMOTRIGINE 100 MG PO TABS
150.0000 mg | ORAL_TABLET | Freq: Two times a day (BID) | ORAL | Status: DC
  Administered 2019-12-22 – 2020-01-04 (×26): 150 mg via ORAL
  Filled 2019-12-22 (×5): qty 2
  Filled 2019-12-22: qty 1
  Filled 2019-12-22 (×21): qty 2

## 2019-12-22 MED ORDER — SERTRALINE HCL 100 MG PO TABS
100.0000 mg | ORAL_TABLET | Freq: Every day | ORAL | Status: DC
  Administered 2019-12-23 – 2020-01-04 (×12): 100 mg via ORAL
  Filled 2019-12-22 (×13): qty 1

## 2019-12-22 MED ORDER — PHENYLEPHRINE 40 MCG/ML (10ML) SYRINGE FOR IV PUSH (FOR BLOOD PRESSURE SUPPORT)
PREFILLED_SYRINGE | INTRAVENOUS | Status: AC
  Filled 2019-12-22: qty 10

## 2019-12-22 MED ORDER — PROPOFOL 10 MG/ML IV BOLUS
INTRAVENOUS | Status: DC | PRN
  Administered 2019-12-22: 160 mg via INTRAVENOUS

## 2019-12-22 MED ORDER — MORPHINE SULFATE (PF) 4 MG/ML IV SOLN
4.0000 mg | INTRAVENOUS | Status: DC | PRN
  Administered 2019-12-22: 4 mg via INTRAVENOUS
  Filled 2019-12-22: qty 1

## 2019-12-22 MED ORDER — FENTANYL CITRATE (PF) 100 MCG/2ML IJ SOLN
50.0000 ug | Freq: Once | INTRAMUSCULAR | Status: AC
  Administered 2019-12-22: 50 ug via INTRAVENOUS
  Filled 2019-12-22: qty 2

## 2019-12-22 MED ORDER — HYDROMORPHONE HCL 1 MG/ML IJ SOLN
INTRAMUSCULAR | Status: AC
  Filled 2019-12-22: qty 1

## 2019-12-22 MED ORDER — ENOXAPARIN SODIUM 40 MG/0.4ML ~~LOC~~ SOLN: 40.0000 mg | SUBCUTANEOUS | Status: DC

## 2019-12-22 MED ORDER — ROCURONIUM BROMIDE 10 MG/ML (PF) SYRINGE
PREFILLED_SYRINGE | INTRAVENOUS | Status: DC | PRN
  Administered 2019-12-22: 50 mg via INTRAVENOUS

## 2019-12-22 MED ORDER — CLOBAZAM 10 MG PO TABS
20.0000 mg | ORAL_TABLET | Freq: Every day | ORAL | Status: DC
  Administered 2019-12-22 – 2020-01-04 (×14): 20 mg via ORAL
  Filled 2019-12-22 (×14): qty 2

## 2019-12-22 MED ORDER — DEXAMETHASONE SODIUM PHOSPHATE 10 MG/ML IJ SOLN
INTRAMUSCULAR | Status: DC | PRN
  Administered 2019-12-22: 10 mg via INTRAVENOUS

## 2019-12-22 MED ORDER — MIDAZOLAM HCL 5 MG/5ML IJ SOLN
INTRAMUSCULAR | Status: DC | PRN
  Administered 2019-12-22: 2 mg via INTRAVENOUS

## 2019-12-22 MED ORDER — LEVETIRACETAM 750 MG PO TABS
750.0000 mg | ORAL_TABLET | Freq: Two times a day (BID) | ORAL | Status: DC
  Administered 2019-12-22 – 2020-01-04 (×26): 750 mg via ORAL
  Filled 2019-12-22 (×27): qty 1

## 2019-12-22 MED ORDER — METHOCARBAMOL 1000 MG/10ML IJ SOLN
500.0000 mg | Freq: Four times a day (QID) | INTRAVENOUS | Status: DC | PRN
  Filled 2019-12-22: qty 5

## 2019-12-22 MED ORDER — ONDANSETRON HCL 4 MG PO TABS: 4.0000 mg | ORAL_TABLET | Freq: Four times a day (QID) | ORAL | Status: DC | PRN

## 2019-12-22 MED ORDER — MIDAZOLAM HCL 2 MG/2ML IJ SOLN
INTRAMUSCULAR | Status: AC
  Filled 2019-12-22: qty 2

## 2019-12-22 MED ORDER — FENTANYL CITRATE (PF) 250 MCG/5ML IJ SOLN
INTRAMUSCULAR | Status: DC | PRN
  Administered 2019-12-22: 50 ug via INTRAVENOUS
  Administered 2019-12-22: 100 ug via INTRAVENOUS

## 2019-12-22 MED ORDER — CEFAZOLIN SODIUM 1 G IJ SOLR
INTRAMUSCULAR | Status: AC
  Filled 2019-12-22: qty 20

## 2019-12-22 MED ORDER — SODIUM CHLORIDE 0.9 % IR SOLN
Status: DC | PRN
  Administered 2019-12-22: 1

## 2019-12-22 MED ORDER — DEXAMETHASONE SODIUM PHOSPHATE 10 MG/ML IJ SOLN
INTRAMUSCULAR | Status: AC
  Filled 2019-12-22: qty 1

## 2019-12-22 MED ORDER — CEFAZOLIN SODIUM-DEXTROSE 2-3 GM-%(50ML) IV SOLR
INTRAVENOUS | Status: DC | PRN
  Administered 2019-12-22: 2 g via INTRAVENOUS

## 2019-12-22 MED ORDER — ZONISAMIDE 100 MG PO CAPS
400.0000 mg | ORAL_CAPSULE | Freq: Every day | ORAL | Status: DC
  Administered 2019-12-23 – 2020-01-04 (×13): 400 mg via ORAL
  Filled 2019-12-22 (×14): qty 4

## 2019-12-22 MED ORDER — DEXAMETHASONE SODIUM PHOSPHATE 10 MG/ML IJ SOLN
INTRAMUSCULAR | Status: AC
  Filled 2019-12-22: qty 2

## 2019-12-22 MED ORDER — SUGAMMADEX SODIUM 200 MG/2ML IV SOLN
INTRAVENOUS | Status: DC | PRN
  Administered 2019-12-22: 200 mg via INTRAVENOUS

## 2019-12-22 MED ORDER — HYDROMORPHONE HCL 1 MG/ML IJ SOLN
0.2500 mg | INTRAMUSCULAR | Status: DC | PRN
  Administered 2019-12-22 (×2): 0.5 mg via INTRAVENOUS

## 2019-12-22 MED ORDER — ONDANSETRON HCL 4 MG/2ML IJ SOLN
INTRAMUSCULAR | Status: DC | PRN
  Administered 2019-12-22: 4 mg via INTRAVENOUS

## 2019-12-22 MED ORDER — LIDOCAINE 2% (20 MG/ML) 5 ML SYRINGE
INTRAMUSCULAR | Status: DC | PRN
  Administered 2019-12-22: 60 mg via INTRAVENOUS

## 2019-12-22 SURGICAL SUPPLY — 51 items
BAR EXFX 400X11 NS LF (EXFIX) ×2
BAR GLASS FIBER EXFX 11X400 (EXFIX) ×4 IMPLANT
BIT DRILL CANN MED FLUTE 4.0 (BIT) IMPLANT
BIT DRILL ORANGE 4.0 LONG (BIT) ×3 IMPLANT
BNDG COHESIVE 6X5 TAN STRL LF (GAUZE/BANDAGES/DRESSINGS) ×1 IMPLANT
BNDG ELASTIC 4X5.8 VLCR STR LF (GAUZE/BANDAGES/DRESSINGS) ×5 IMPLANT
BNDG ELASTIC 6X5.8 VLCR STR LF (GAUZE/BANDAGES/DRESSINGS) ×1 IMPLANT
BNDG GAUZE ELAST 4 BULKY (GAUZE/BANDAGES/DRESSINGS) ×4 IMPLANT
COVER SURGICAL LIGHT HANDLE (MISCELLANEOUS) ×3 IMPLANT
COVER WAND RF STERILE (DRAPES) ×1 IMPLANT
DRAPE C-ARM 42X72 X-RAY (DRAPES) ×2 IMPLANT
DRAPE C-ARMOR (DRAPES) ×3 IMPLANT
DRAPE U-SHAPE 47X51 STRL (DRAPES) ×5 IMPLANT
DRILL CANN 4.0MM (BIT) ×3
ELECT REM PT RETURN 9FT ADLT (ELECTROSURGICAL) ×3
ELECTRODE REM PT RTRN 9FT ADLT (ELECTROSURGICAL) ×1 IMPLANT
GAUZE SPONGE 4X4 12PLY STRL (GAUZE/BANDAGES/DRESSINGS) ×1 IMPLANT
GAUZE XEROFORM 1X8 LF (GAUZE/BANDAGES/DRESSINGS) ×6 IMPLANT
GAUZE XEROFORM 5X9 LF (GAUZE/BANDAGES/DRESSINGS) IMPLANT
GLOVE BIOGEL PI IND STRL 7.0 (GLOVE) ×1 IMPLANT
GLOVE BIOGEL PI INDICATOR 7.0 (GLOVE) ×2
GLOVE ECLIPSE 7.0 STRL STRAW (GLOVE) ×3 IMPLANT
GLOVE SKINSENSE NS SZ7.5 (GLOVE) ×4
GLOVE SKINSENSE STRL SZ7.5 (GLOVE) ×2 IMPLANT
GLOVE SURG PROTEXIS BL SZ6.5 (GLOVE) ×6 IMPLANT
GLOVE SURG SS PI 7.5 STRL IVOR (GLOVE) ×3 IMPLANT
GLOVE SURG SYN 6.5 PF PI BL (GLOVE) IMPLANT
GOWN STRL REIN XL XLG (GOWN DISPOSABLE) ×3 IMPLANT
HANDPIECE INTERPULSE COAX TIP (DISPOSABLE)
KIT BASIN OR (CUSTOM PROCEDURE TRAY) ×3 IMPLANT
KIT TURNOVER KIT B (KITS) ×3 IMPLANT
NEEDLE 22X1 1/2 (OR ONLY) (NEEDLE) IMPLANT
NS IRRIG 1000ML POUR BTL (IV SOLUTION) ×3 IMPLANT
PACK ORTHO EXTREMITY (CUSTOM PROCEDURE TRAY) ×3 IMPLANT
PAD ARMBOARD 7.5X6 YLW CONV (MISCELLANEOUS) ×6 IMPLANT
PADDING CAST COTTON 6X4 STRL (CAST SUPPLIES) ×3 IMPLANT
PIN CLAMP 2BAR 75MM BLUE (EXFIX) ×4 IMPLANT
PIN HALF ORANGE 5X200X45MM (EXFIX) ×6 IMPLANT
PIN HALF YELLOW 5X160X35 (EXFIX) ×4 IMPLANT
SET HNDPC FAN SPRY TIP SCT (DISPOSABLE) IMPLANT
SPONGE LAP 18X18 RF (DISPOSABLE) ×3 IMPLANT
STAPLER VISISTAT 35W (STAPLE) IMPLANT
STOCKINETTE IMPERVIOUS LG (DRAPES) ×3 IMPLANT
SYR CONTROL 10ML LL (SYRINGE) IMPLANT
TOWEL GREEN STERILE (TOWEL DISPOSABLE) ×6 IMPLANT
TOWEL GREEN STERILE FF (TOWEL DISPOSABLE) ×6 IMPLANT
TUBE CONNECTING 12'X1/4 (SUCTIONS) ×1
TUBE CONNECTING 12X1/4 (SUCTIONS) ×2 IMPLANT
UNDERPAD 30X30 (UNDERPADS AND DIAPERS) ×3 IMPLANT
WATER STERILE IRR 1000ML POUR (IV SOLUTION) ×2 IMPLANT
YANKAUER SUCT BULB TIP NO VENT (SUCTIONS) ×3 IMPLANT

## 2019-12-22 NOTE — ED Notes (Signed)
Pt transported to CT ?

## 2019-12-22 NOTE — Consult Note (Signed)
Reason for [place] Referring Physician: M Trifan  Cindy Mcclure is an 54 y.o. female.  HPI: Cindy Mcclure was about 65ft up a ladder when she lost her balance and fell. She landed on her feet then fell backwards. She did not hit her head or black out. She c/o right ankle and left knee pain. She was brought to the ED where x-rays showed a right ankle/calc fx and left tibia plateau fx and orthopedic surgery was consulted.   Past Medical History:  Diagnosis Date  . Allergic rhinitis   . Anginal pain [facility])    sees dr Clayborn Bigness  . Anxiety   . Arthritis    all over...knees, ankles, back  . Back pain   . Cervical radiculitis    no surgery, just cortisone shots.    . Constipation   . Constipation   . DDD (degenerative disc disease), cervical   . Dermatophytosis of nail 2019   both feet  . GERD (gastroesophageal reflux disease)   . History of kidney stones    h/o  . Osteoarthritis   . Panic attacks   . Plantar fasciitis    bilaterally  . Seizures ([place]) 1972   unsure of when she had last seizure due to petit mal type. does not know what brings on a seizure.  . Stress bladder incontinence, female   . Urinary incontinence     Past Surgical History:  Procedure Laterality Date  . CESAREAN SECTION  [id number]  . CHOLECYSTECTOMY N/A 12/06/2015   Procedure: LAPAROSCOPIC CHOLECYSTECTOMY WITH INTRAOPERATIVE CHOLANGIOGRAM;  Surgeon: Dia Crawford III, MD;  Location: [facility] ORS;  Service: General;  Laterality: N/A;  . COLONOSCOPY WITH PROPOFOL N/A 09/29/2016   Procedure: COLONOSCOPY WITH PROPOFOL;  Surgeon: Manya Silvas, MD;  Location: [facility] ENDOSCOPY;  Service: Endoscopy;  Laterality: N/A;  . KNEE ARTHROSCOPY WITH EXCISION PLICA Left [id number]   Procedure: KNEE ARTHROSCOPY WITH EXCISION PLICA, PARTIAL SYNOVECTOMY;  Surgeon: Hessie Knows, MD;  Location: [facility] ORS;  Service: Orthopedics;  Laterality: Left;  . KNEE ARTHROSCOPY WITH MEDIAL MENISECTOMY Right 09/03/2017   Procedure: KNEE  ARTHROSCOPY WITH MEDIAL MENISECTOMY, PARTIAL SYNOVECTOMY;  Surgeon: Hessie Knows, MD;  Location: [facility] ORS;  Service: Orthopedics;  Laterality: Right;  . SHOULDER ARTHROSCOPY WITH DEBRIDEMENT AND BICEP TENDON REPAIR Left 03/22/2019   Procedure: SHOULDER ARTHROSCOPY WITH DEBRIDEMENT, ROTATOR CUFF REPAIR, AND BICEP TENDON REPAIR;  Surgeon: Corky Mull, MD;  Location: [facility] ORS;  Service: Orthopedics;  Laterality: Left;  . SUBACROMIAL DECOMPRESSION  03/22/2019   Procedure: SUBACROMIAL DECOMPRESSION;  Surgeon: Corky Mull, MD;  Location: [facility] ORS;  Service: Orthopedics;;  . SURGERY FOR SEIZURES     nothing implanted in head. she was 54 years old. done at [facility]  . TUBAL LIGATION  2003    Family History  Problem Relation Age of Onset  . Anxiety disorder Daughter     Social History:  reports that she quit smoking about 7 years ago. Her smoking use included cigarettes. She smoked 0.25 packs per day. She has never used smokeless tobacco. She reports current alcohol use. She reports that she does not use drugs.  Allergies: No Known Allergies  Medications: I have reviewed the patient's current medications.  Results for orders placed or performed during the hospital encounter of 12/22/19 (from the past 48 hour(s))  CBC     Status: Abnormal   Collection Time: 12/22/19  2:36 PM  Result Value Ref Range   WBC 18.1 (H) 4.0 - 10.5 K/uL   RBC 3.87  3.87 - 5.11 MIL/uL   Hemoglobin 11.9 (L) 12.0 - 15.0 g/dL   HCT 37.4 36.0 - 46.0 %   MCV 96.6 80.0 - 100.0 fL   MCH 30.7 26.0 - 34.0 pg   MCHC 31.8 30.0 - 36.0 g/dL   RDW 13.3 11.5 - 15.5 %   Platelets 375 150 - 400 K/uL   nRBC 0.0 0.0 - 0.2 %    Comment: Performed at [facility], [place] [address], [place], [place] [address]    DG Chest 1 View  Result Date: 12/22/2019 CLINICAL DATA:  Fall from ladder. EXAM: CHEST  1 VIEW COMPARISON:  December 05, 2015. FINDINGS: The heart size and mediastinal contours are within normal limits. Both lungs are clear. No  pneumothorax or pleural effusion is noted. The visualized skeletal structures are unremarkable. IMPRESSION: No active disease. Electronically Signed   By: Marijo Conception M.D.   On: 12/22/2019 14:19   DG Ankle Complete Right  Result Date: 12/22/2019 CLINICAL DATA:  Fall from ladder. EXAM: RIGHT ANKLE - COMPLETE 3+ VIEW COMPARISON:  None. FINDINGS: Comminuted fracture involving the calcaneus with mild displacement along the plantar aspect. The fracture extends to the subtalar joint. In addition, the calcaneal fracture involves the anterior process of the calcaneus. The right ankle appears to be located but there is comminuted fracture involving the distal fibula with displaced lateral fragment or fragments. Extensive soft tissue swelling involving the lower leg and ankle region. No gross abnormality to the talar dome. Medial malleolus is intact. IMPRESSION: 1. Comminuted fractures involving the calcaneus and the distal fibula. The fractures could be better characterized with CT to exclude additional injuries. 2. Soft tissue swelling in the lower leg and ankle. Electronically Signed   By: Markus Daft M.D.   On: 12/22/2019 14:23   DG Knee Complete 4 Views Left  Result Date: 12/22/2019 CLINICAL DATA:  Fall from a 12 foot ladder. Multiple injuries. Left knee pain. EXAM: LEFT KNEE - COMPLETE 4+ VIEW COMPARISON:  None. FINDINGS: There are comminuted fractures of the proximal tibia as well as the proximal fibula. Fracture of the tibia extends transversely across the metaphysis with additional fractures extending to the medial articular margin of the lateral tibial plateau. Fractures show mild displacement, largest lateral component displaced approximately 1.5 cm laterally. There is also a comminuted fracture of the proximal fibula centered on the metaphysis, with no significant displacement. Knee joint is normally aligned. There is mild depression of the lateral tibial plateau articular surface. No femur fracture.  There is a joint effusion with a fat fluid level. Surrounding soft tissue swelling is noted of the proximal leg. IMPRESSION: 1. Comminuted, mildly displaced fractures of the proximal left tibia intersecting the lateral tibial plateau articular surface. Comminuted nondisplaced fractures of the proximal fibula. No dislocation. Electronically Signed   By: Lajean Manes M.D.   On: 12/22/2019 14:20   DG Knee Complete 4 Views Right  Result Date: 12/22/2019 CLINICAL DATA:  [place] from a 12 foot ladder.  Right knee pain. EXAM: RIGHT KNEE - COMPLETE 4+ VIEW COMPARISON:  None. FINDINGS: Nondisplaced oblique fracture of the proximal fibular shaft. No other fractures. Knee joint is normally spaced and aligned. Probable small joint effusion. Soft tissues otherwise unremarkable. IMPRESSION: Nondisplaced, nonangulated fracture of the proximal right fibular shaft. No other fractures. Knee joint normally aligned. Electronically Signed   By: Lajean Manes M.D.   On: 12/22/2019 14:21    Review of Systems  HENT: Negative for ear discharge,  ear pain, hearing loss and tinnitus.   Eyes: Negative for photophobia and pain.  Respiratory: Negative for cough and shortness of breath.   Cardiovascular: Negative for chest pain.  Gastrointestinal: Negative for abdominal pain, nausea and vomiting.  Genitourinary: Negative for dysuria, flank pain, frequency and urgency.  Musculoskeletal: Positive for arthralgias (Right ankle, left knee) and back pain (Chronic). Negative for myalgias and neck pain.  Neurological: Negative for dizziness and headaches.  Hematological: Does not bruise/bleed easily.  Psychiatric/Behavioral: The patient is not nervous/anxious.    Blood pressure 102/69, pulse 86, temperature 97.7 F (36.5 C), temperature source Oral, resp. rate 19, height 5' (1.524 m), weight 77.1 kg, SpO2 97 %. Physical Exam  Constitutional: She appears well-developed and well-nourished. No distress.  HENT:  Head: Normocephalic and  atraumatic.  Eyes: Conjunctivae are normal. Right eye exhibits no discharge. Left eye exhibits no discharge. No scleral icterus.  Cardiovascular: Normal rate and regular rhythm.  Respiratory: Effort normal. No respiratory distress.  Musculoskeletal:     Cervical back: Normal range of motion.     Comments: Pelvis--no traumatic wounds or rash, no ecchymosis, stable to manual stress, nontender  RLE No traumatic wounds or rash  Ankle/hindfoot mod TTP, medial ecchymosis, mod edema  No knee effusion  Knee stable to varus/ valgus and anterior/posterior stress  Sens DPN, SPN, TN intact  Motor EHL, ext, flex, evers 5/5  DP 1+, PT 0, No significant edema  LLE No traumatic wounds or rash  Large hematoma inferomedial knee w/ecchymosis, calf compartments soft and NT, no pain with dorsiflexion  No ankle effusion  Sens DPN, SPN, TN intact  Motor EHL, ext, flex, evers 5/5  DP 1+, PT 0, No significant edema  Neurological: She is alert.  Skin: Skin is warm and dry. She is not diaphoretic.  Psychiatric: She has a normal mood and affect. Her behavior is normal.    Assessment/Plan: Right ankle/calc fx -- Will get CT, will need ORIF at later date and will be NWB. Splint in meantime. Left tibia plateau fx -- Will get CT, will need ORIF at later date and will be NWB. Splint in meantime. Multiple medical problems including seizure disorder, chronic back pain, and GERD -- Home meds    Lisette Abu, PA-C Orthopedic Surgery [phone] 12/22/2019, 3:13 PM

## 2019-12-22 NOTE — ED Provider Notes (Signed)
[facility] EMERGENCY DEPARTMENT Provider Note   CSN: [id number] Arrival date & time: 12/22/19  1258     History Chief Complaint  Patient presents with   Cindy Mcclure is a 54 y.o. female presenting for evaluation after fall.   Patient states just prior to arrival she was on the second high a step of a 10 foot ladder when she was startled by her daughter, and fell off the ladder landing on her feet, causing her to fall back onto her buttocks and then onto her back.  She denies hitting her head or loss of consciousness.  She reports pain in her left knee.  She denies significant pain elsewhere, though also has a little bit of pain in her right ankle.  She denies headache, vision changes, slurred speech, neck pain, back pain, chest pain, nausea, vomiting, abdominal pain, numbness, tingling, loss of bowel bladder control.  She is not on blood thinners.  Additional history taken chart review.  Patient with a history of anxiety, back pain, degenerative disc disease, seizures.   HPI     Past Medical History:  Diagnosis Date   Allergic rhinitis    Anginal pain [facility])    sees dr Clayborn Bigness   Anxiety    Arthritis    all over...knees, ankles, back   Back pain    Cervical radiculitis    no surgery, just cortisone shots.     Constipation    Constipation    DDD (degenerative disc disease), cervical    Dermatophytosis of nail 2019   both feet   GERD (gastroesophageal reflux disease)    History of kidney stones    h/o   Osteoarthritis    Panic attacks    Plantar fasciitis    bilaterally   Seizures ([place]) 1972   unsure of when she had last seizure due to petit mal type. does not know what brings on a seizure.   Stress bladder incontinence, female    Urinary incontinence     Patient Active Problem List   Diagnosis Date Noted   Closed bicondylar fracture of left tibial plateau 12/22/2019   Benign neoplasm of soft tissue of left  shoulder 03/23/2019   Tendinitis of upper biceps tendon of left shoulder 03/23/2019   Nonrheumatic mitral valve regurgitation 03/22/2019   MDD (major depressive disorder), single episode, mild ([place]) 02/24/2019   GAD (generalized anxiety disorder) 02/24/2019   Rotator cuff tendinitis, left 03/08/2018   Traumatic incomplete tear of left rotator cuff 03/08/2018   Left arm pain 01/05/2018   Neck pain 01/05/2018   Frequent falls 07/29/2016   Mixed stress and urge urinary incontinence 07/01/2016   Constipation, unspecified 05/05/2016   Mixed incontinence 12/12/2015   Cholecystitis with cholelithiasis 12/06/2015   Cholecystitis    Other specified anxiety disorders 04/26/2015   Cervical radiculitis 04/20/2015   Degeneration of intervertebral disc of cervical region 04/20/2015   Encounter for therapeutic drug level monitoring 01/18/2015   Arthritis of knee, degenerative 12/29/2013   Partial epilepsy with impairment of consciousness ([place]) 12/09/2011   Allergic rhinitis 02/21/2008    Past Surgical History:  Procedure Laterality Date   CESAREAN SECTION  [id number]   CHOLECYSTECTOMY N/A 12/06/2015   Procedure: LAPAROSCOPIC CHOLECYSTECTOMY WITH INTRAOPERATIVE CHOLANGIOGRAM;  Surgeon: Dia Crawford III, MD;  Location: [facility] ORS;  Service: General;  Laterality: N/A;   COLONOSCOPY WITH PROPOFOL N/A 09/29/2016   Procedure: COLONOSCOPY WITH PROPOFOL;  Surgeon: Manya Silvas, MD;  Location: [facility] ENDOSCOPY;  Service: Endoscopy;  Laterality: N/A;   KNEE ARTHROSCOPY WITH EXCISION PLICA Left [id number]   Procedure: KNEE ARTHROSCOPY WITH EXCISION PLICA, PARTIAL SYNOVECTOMY;  Surgeon: Hessie Knows, MD;  Location: [facility] ORS;  Service: Orthopedics;  Laterality: Left;   KNEE ARTHROSCOPY WITH MEDIAL MENISECTOMY Right 09/03/2017   Procedure: KNEE ARTHROSCOPY WITH MEDIAL MENISECTOMY, PARTIAL SYNOVECTOMY;  Surgeon: Hessie Knows, MD;  Location: [facility] ORS;  Service: Orthopedics;  Laterality: Right;     SHOULDER ARTHROSCOPY WITH DEBRIDEMENT AND BICEP TENDON REPAIR Left 03/22/2019   Procedure: SHOULDER ARTHROSCOPY WITH DEBRIDEMENT, ROTATOR CUFF REPAIR, AND BICEP TENDON REPAIR;  Surgeon: Corky Mull, MD;  Location: [facility] ORS;  Service: Orthopedics;  Laterality: Left;   SUBACROMIAL DECOMPRESSION  03/22/2019   Procedure: SUBACROMIAL DECOMPRESSION;  Surgeon: Corky Mull, MD;  Location: [facility] ORS;  Service: Orthopedics;;   SURGERY FOR SEIZURES     nothing implanted in head. she was 54 years old. done at [place]  2003     OB History   No obstetric history on file.     Family History  Problem Relation Age of Onset   Anxiety disorder Daughter     Social History   Tobacco Use   Smoking status: Former Smoker    Packs/day: 0.25    Types: Cigarettes    Quit date: 08/17/2012    Years since quitting: 7.3   Smokeless tobacco: Never Used   Tobacco comment: pt states she smoked intermittently-smokers in home  Substance Use Topics   Alcohol use: Yes    Comment: only on special occasions    Drug use: No    Comment: denies    Home Medications Prior to Admission medications   Medication Sig Start Date End Date Taking? Authorizing Provider  ascorbic acid (VITAMIN C) 1000 MG tablet Take 1,000 mg by mouth daily.    Yes [provider]  Calcium Carb-Cholecalciferol (CALCIUM 600 + D PO) Take 1 tablet by mouth daily.   Yes [provider]  cloBAZam (ONFI) 20 MG tablet Take 20 mg by mouth at bedtime.    Yes [provider]  diclofenac (VOLTAREN) 75 MG EC tablet Take 75 mg by mouth 2 (two) times daily.   Yes [provider]  Flaxseed, Linseed, (FLAXSEED OIL) 1000 MG CAPS Take 1,000 mg by mouth daily.   Yes [provider]  lamoTRIgine (LAMICTAL) 150 MG tablet Take 150 mg by mouth 2 (two) times daily.    Yes [provider]  Levetiracetam (KEPPRA XR) 750 MG TB24 Take 750 mg by mouth 2 (two) times daily.   Yes [provider]  Multiple Vitamins-Minerals (CENTRUM ADULTS PO) Take 1 tablet by mouth daily.    Yes [provider]  omeprazole (PRILOSEC) 20 MG capsule Take 20 mg by mouth every morning.  03/10/18 12/22/19 Yes [provider]  pyridOXINE (VITAMIN B-6) 100 MG tablet Take 100 mg by mouth daily.    Yes [provider]  sertraline (ZOLOFT) 100 MG tablet Take 1 tablet (100 mg total) by mouth daily. Patient taking differently: Take 100 mg by mouth at bedtime.  12/08/19  Yes Ursula Alert, MD  traMADol (ULTRAM) 50 MG tablet Take 50 mg by mouth daily.  03/07/19  Yes [provider]  Vitamin A 3 MG (10000 UT) TABS Take 3,000 Units by mouth daily.    Yes [provider]  vitamin B-12 (CYANOCOBALAMIN) 500 MCG tablet Take 500 mcg by mouth daily.    Yes [provider]  vitamin C (ASCORBIC ACID) 500 MG tablet Take 500 mg by mouth daily.   Yes [provider]  Vitamin E 200 units TABS Take 200 Units by mouth daily.    Yes [provider]  zonisamide (ZONEGRAN) 100 MG capsule Take 400 mg by mouth daily with supper.   Yes [provider]    Allergies    Patient has no known allergies.  Review of Systems   Review of Systems  Musculoskeletal: Positive for arthralgias and joint swelling.  All other systems reviewed and are negative.   Physical Exam Updated Vital Signs BP 104/66    Pulse 84    Temp 97.7 F (36.5 C) (Oral)    Resp 12    Ht 5' (1.524 m)    Wt 77.1 kg    SpO2 99%    BMI 33.20 kg/m   Physical Exam Vitals and nursing note reviewed.  Constitutional:      General: She is not in acute distress.    Appearance: She is well-developed.     Comments: Appears uncomfortable due to pain  HENT:     Head: Normocephalic and atraumatic.     Comments: No sign of head trauma Eyes:     Extraocular Movements: Extraocular movements intact.     Conjunctiva/sclera: Conjunctivae normal.     Pupils: Pupils are equal, round, and  reactive to light.  Neck:     Comments: No c-collar. No midline spinal tenderness Cardiovascular:     Rate and Rhythm: Normal rate and regular rhythm.     Pulses: Normal pulses.  Pulmonary:     Effort: Pulmonary effort is normal. No respiratory distress.     Breath sounds: Normal breath sounds. No wheezing.  Abdominal:     General: There is no distension.     Palpations: Abdomen is soft. There is no mass.     Tenderness: There is no abdominal tenderness. There is no guarding or rebound.     Comments: No ttp of the abd  Musculoskeletal:        General: Swelling, tenderness and deformity present.     Comments: Obvious deformity of the L knee and R ankle. Pedal pulses 2+ bilaterally. Good distal sensation and cap refill of lower ext.  Pelvis stable and nontender. Pt able to go from laying to sitting without hip pain. No ttp of midline back/spine. No signs of trauma.  No deformity or signs of injury of upper extremities.   Skin:    General: Skin is warm and dry.  Neurological:     Mental Status: She is alert and oriented to person, place, and time.     ED Results / Procedures / Treatments   Labs (all labs ordered are listed, but only abnormal results are displayed) Labs Reviewed  CBC - Abnormal; Notable for the following components:      Result Value   WBC 18.1 (*)    Hemoglobin 11.9 (*)    All other components within normal limits  COMPREHENSIVE METABOLIC PANEL - Abnormal; Notable for the following components:   Glucose, Bld 136 (*)    Creatinine, Ser 1.17 (*)    Calcium 8.4 (*)    GFR calc non Af Amer 52 (*)    All other components within normal limits  RESPIRATORY PANEL BY RT PCR (FLU A&B, COVID)  HIV ANTIBODY (ROUTINE TESTING W REFLEX)    EKG EKG Interpretation  Date/Time:  Thursday Dec 22 2019 13:11:16 EDT Ventricular Rate:  77  PR Interval:    QRS Duration: 82 QT Interval:  382 QTC Calculation: 433 R Axis:   4 Text Interpretation: Sinus rhythm Low voltage,  precordial leads No STEMI Confirmed by Octaviano Glow [phone]) on 12/22/2019 1:33:38 PM   Radiology DG Chest 1 View  Result Date: 12/22/2019 CLINICAL DATA:  Fall from ladder. EXAM: CHEST  1 VIEW COMPARISON:  December 05, 2015. FINDINGS: The heart size and mediastinal contours are within normal limits. Both lungs are clear. No pneumothorax or pleural effusion is noted. The visualized skeletal structures are unremarkable. IMPRESSION: No active disease. Electronically Signed   By: Marijo Conception M.D.   On: 12/22/2019 14:19   DG Ankle Complete Right  Result Date: 12/22/2019 CLINICAL DATA:  Fall from ladder. EXAM: RIGHT ANKLE - COMPLETE 3+ VIEW COMPARISON:  None. FINDINGS: Comminuted fracture involving the calcaneus with mild displacement along the plantar aspect. The fracture extends to the subtalar joint. In addition, the calcaneal fracture involves the anterior process of the calcaneus. The right ankle appears to be located but there is comminuted fracture involving the distal fibula with displaced lateral fragment or fragments. Extensive soft tissue swelling involving the lower leg and ankle region. No gross abnormality to the talar dome. Medial malleolus is intact. IMPRESSION: 1. Comminuted fractures involving the calcaneus and the distal fibula. The fractures could be better characterized with CT to exclude additional injuries. 2. Soft tissue swelling in the lower leg and ankle. Electronically Signed   By: Markus Daft M.D.   On: 12/22/2019 14:23   CT Head Wo Contrast  Result Date: 12/22/2019 CLINICAL DATA:  Patient fell from 12 foot ladder. Patient fell off, landing on her feet. Swelling in the MEDIAL RIGHT ankle and below LEFT knee. EXAM: CT HEAD WITHOUT CONTRAST CT CERVICAL SPINE WITHOUT CONTRAST TECHNIQUE: Multidetector CT imaging of the head and cervical spine was performed following the standard protocol without intravenous contrast. Multiplanar CT image reconstructions of the cervical spine were also  generated. COMPARISON:  MR cervical spine 01/19/2015, CT head on 06/16/2018 FINDINGS: CT HEAD FINDINGS Brain: Stable, diffuse cerebellar atrophy. Focal low-attenuation within the RIGHT frontal lobe is consistent with chronic ischemic change. There is no intra or extra-axial fluid collection or mass lesion. The basilar cisterns and ventricles have a normal appearance. There is no CT evidence for acute infarction or hemorrhage. Vascular: No hyperdense vessel or unexpected calcification. Skull: Normal. Negative for fracture or focal lesion. Sinuses/Orbits: No acute finding. Other: None CT CERVICAL SPINE FINDINGS Alignment: Normal. Skull base and vertebrae: No acute fracture. No primary bone lesion or focal pathologic process. Soft tissues and spinal canal: No prevertebral fluid or swelling. No visible canal hematoma. Disc levels: There is disc height loss associated with uncovertebral spurring primarily at C6-7. Upper chest: There is a fracture of the proximal RIGHT first rib. Other: None IMPRESSION: 1. No evidence for acute intracranial abnormality. 2. Stable, diffuse cerebellar atrophy. 3. Chronic RIGHT frontal lobe infarct. 4. No evidence for acute cervical spine abnormality. 5. Fracture of the proximal RIGHT first rib. These results were called by telephone at the time of interpretation on 12/22/2019 at 4:26 pm to provider [facility] , who verbally acknowledged these results. Electronically Signed   By: Nolon Nations M.D.   On: 12/22/2019 16:27   CT Chest W Contrast  Result Date: 12/22/2019 CLINICAL DATA:  [place] from 12 foot ladder, right ankle fracture, left knee fracture EXAM: CT CHEST, ABDOMEN, AND PELVIS WITH CONTRAST TECHNIQUE: Multidetector CT imaging of the chest,  abdomen and pelvis was performed following the standard protocol during bolus administration of intravenous contrast. CONTRAST:  157mL OMNIPAQUE IOHEXOL 300 MG/ML  SOLN COMPARISON:  None. FINDINGS: CT CHEST FINDINGS Cardiovascular: Heart  and great vessels are unremarkable with no evidence of vascular injury. No pericardial effusion. Mediastinum/Nodes: No enlarged mediastinal, hilar, or axillary lymph nodes. Thyroid gland, trachea, and esophagus demonstrate no significant findings. Lungs/Pleura: No airspace disease, effusion, or pneumothorax. Central airways are patent. Musculoskeletal: A minimally displaced posterior right first rib fracture is better seen on the corresponding CT cervical spine evaluation. There are no other acute displaced fractures. Reconstructed images demonstrate no additional findings. CT ABDOMEN PELVIS FINDINGS Hepatobiliary: No hepatic injury or perihepatic hematoma. The gallbladder is surgically absent. Pancreas: Unremarkable. No pancreatic ductal dilatation or surrounding inflammatory changes. Spleen: No splenic injury or perisplenic hematoma. Adrenals/Urinary Tract: No adrenal hemorrhage or renal injury identified. Bladder is unremarkable. Stomach/Bowel: No bowel obstruction or ileus. No bowel wall thickening or inflammatory change. Vascular/Lymphatic: No significant vascular findings are present. No enlarged abdominal or pelvic lymph nodes. Reproductive: 2.2 cm hypodensity ventral lower uterine segment consistent with fibroid. Otherwise the uterus and adnexal structures are age-appropriate. Other: No abdominal wall hernia or abnormality. No abdominopelvic ascites. Musculoskeletal: No acute displaced fractures. Reconstructed images demonstrate no additional findings. IMPRESSION: 1. Minimally displaced posterior right first rib fracture better seen on the corresponding CT cervical spine evaluation. 2. Otherwise no acute intrathoracic, intra-abdominal or intrapelvic injury. Electronically Signed   By: Randa Ngo M.D.   On: 12/22/2019 17:59   CT Cervical Spine Wo Contrast  Result Date: 12/22/2019 CLINICAL DATA:  Patient fell from 12 foot ladder. Patient fell off, landing on her feet. Swelling in the MEDIAL RIGHT ankle  and below LEFT knee. EXAM: CT HEAD WITHOUT CONTRAST CT CERVICAL SPINE WITHOUT CONTRAST TECHNIQUE: Multidetector CT imaging of the head and cervical spine was performed following the standard protocol without intravenous contrast. Multiplanar CT image reconstructions of the cervical spine were also generated. COMPARISON:  MR cervical spine 01/19/2015, CT head on 06/16/2018 FINDINGS: CT HEAD FINDINGS Brain: Stable, diffuse cerebellar atrophy. Focal low-attenuation within the RIGHT frontal lobe is consistent with chronic ischemic change. There is no intra or extra-axial fluid collection or mass lesion. The basilar cisterns and ventricles have a normal appearance. There is no CT evidence for acute infarction or hemorrhage. Vascular: No hyperdense vessel or unexpected calcification. Skull: Normal. Negative for fracture or focal lesion. Sinuses/Orbits: No acute finding. Other: None CT CERVICAL SPINE FINDINGS Alignment: Normal. Skull base and vertebrae: No acute fracture. No primary bone lesion or focal pathologic process. Soft tissues and spinal canal: No prevertebral fluid or swelling. No visible canal hematoma. Disc levels: There is disc height loss associated with uncovertebral spurring primarily at C6-7. Upper chest: There is a fracture of the proximal RIGHT first rib. Other: None IMPRESSION: 1. No evidence for acute intracranial abnormality. 2. Stable, diffuse cerebellar atrophy. 3. Chronic RIGHT frontal lobe infarct. 4. No evidence for acute cervical spine abnormality. 5. Fracture of the proximal RIGHT first rib. These results were called by telephone at the time of interpretation on 12/22/2019 at 4:26 pm to provider [facility] , who verbally acknowledged these results. Electronically Signed   By: Nolon Nations M.D.   On: 12/22/2019 16:27   CT Lumbar Spine Wo Contrast  Result Date: 12/22/2019 CLINICAL DATA:  Low back pain status post trauma. Fall from 12 foot ladder. EXAM: CT LUMBAR SPINE WITHOUT CONTRAST  TECHNIQUE: Multidetector CT imaging of the lumbar  spine was performed without intravenous contrast administration. Multiplanar CT image reconstructions were also generated. COMPARISON:  None. FINDINGS: Segmentation: 5 lumbar type vertebrae. Alignment: Normal. Vertebrae: No acute fracture or focal pathologic process. Paraspinal and other soft tissues: The patient is status post prior cholecystectomy. The visualized portions of the kidneys are unremarkable. There is no evidence for an abdominal aortic aneurysm. Disc levels: The disc heights are relatively well preserved throughout the lumbar spine. IMPRESSION: No acute osseous abnormality of the lumbar spine. Electronically Signed   By: Constance Holster M.D.   On: 12/22/2019 16:19   CT KNEE LEFT WO CONTRAST  Result Date: 12/22/2019 CLINICAL DATA:  [place] from a ladder. EXAM: CT OF THE left KNEE WITHOUT CONTRAST TECHNIQUE: Multidetector CT imaging of the left knee was performed according to the standard protocol. Multiplanar CT image reconstructions were also generated. COMPARISON:  Radiographs, same date. FINDINGS: Complex comminuted fractures of the tibial plateau. There is a large inverted Y shaped intra-articular fracture through the mid portion of the lateral tibial plateau splitting the plateau in half and then both arms extend out through the medial and lateral cortices near the metadiaphyseal junction. Maximum depression of the lateral plateau fracture is 6.4 mm. The tibial spines are severely comminuted with numerous small fracture fragments. The medial tibial plateau fracture is an oblique coursing fracture through the anterior third of the plateau and extending posteriorly out through the posterior cortex. Complex comminuted fracture of the proximal fibula including the head neck and proximal shaft. No significant displacement. The femur is intact. No patellar fracture. Large lipohemarthrosis. Grossly by CT the cruciate and collateral ligaments are  intact. But exam is limited. The quadriceps and patellar tendons are intact. IMPRESSION: 1. Complex comminuted fractures of the tibial plateau as described above. 2. Complex comminuted fracture of the proximal fibula. 3. Large lipohemarthrosis. 4. Grossly by CT the cruciate and collateral ligaments are intact. But exam is limited. Electronically Signed   By: Marijo Sanes M.D.   On: 12/22/2019 16:32   CT ABDOMEN PELVIS W CONTRAST  Result Date: 12/22/2019 CLINICAL DATA:  [place] from 12 foot ladder, right ankle fracture, left knee fracture EXAM: CT CHEST, ABDOMEN, AND PELVIS WITH CONTRAST TECHNIQUE: Multidetector CT imaging of the chest, abdomen and pelvis was performed following the standard protocol during bolus administration of intravenous contrast. CONTRAST:  129mL OMNIPAQUE IOHEXOL 300 MG/ML  SOLN COMPARISON:  None. FINDINGS: CT CHEST FINDINGS Cardiovascular: Heart and great vessels are unremarkable with no evidence of vascular injury. No pericardial effusion. Mediastinum/Nodes: No enlarged mediastinal, hilar, or axillary lymph nodes. Thyroid gland, trachea, and esophagus demonstrate no significant findings. Lungs/Pleura: No airspace disease, effusion, or pneumothorax. Central airways are patent. Musculoskeletal: A minimally displaced posterior right first rib fracture is better seen on the corresponding CT cervical spine evaluation. There are no other acute displaced fractures. Reconstructed images demonstrate no additional findings. CT ABDOMEN PELVIS FINDINGS Hepatobiliary: No hepatic injury or perihepatic hematoma. The gallbladder is surgically absent. Pancreas: Unremarkable. No pancreatic ductal dilatation or surrounding inflammatory changes. Spleen: No splenic injury or perisplenic hematoma. Adrenals/Urinary Tract: No adrenal hemorrhage or renal injury identified. Bladder is unremarkable. Stomach/Bowel: No bowel obstruction or ileus. No bowel wall thickening or inflammatory change. Vascular/Lymphatic: No  significant vascular findings are present. No enlarged abdominal or pelvic lymph nodes. Reproductive: 2.2 cm hypodensity ventral lower uterine segment consistent with fibroid. Otherwise the uterus and adnexal structures are age-appropriate. Other: No abdominal wall hernia or abnormality. No abdominopelvic ascites. Musculoskeletal: No acute displaced fractures. Reconstructed  images demonstrate no additional findings. IMPRESSION: 1. Minimally displaced posterior right first rib fracture better seen on the corresponding CT cervical spine evaluation. 2. Otherwise no acute intrathoracic, intra-abdominal or intrapelvic injury. Electronically Signed   By: Randa Ngo M.D.   On: 12/22/2019 17:59   CT Ankle Right Wo Contrast  Result Date: 12/22/2019 CLINICAL DATA:  [place] from a ladder. EXAM: CT OF THE RIGHT ANKLE AND RIGHT FOOT WITHOUT CONTRAST TECHNIQUE: Multidetector CT imaging of the right ankle was performed according to the standard protocol. Multiplanar CT image reconstructions were also generated. COMPARISON:  Radiographs, same date. FINDINGS: Complex comminuted central depression type fracture of the calcaneus with multiple fracture lines. The posterior facet is comminuted and impacted and slightly rotated dorsally. Longitudinal/vertical fracture through the base of the sustentacular limb with approximately 3 mm of displacement. The anterior process of the calcaneus is intact. The tibiotalar joint is maintained. No fracture of the tibia or talus is identified. The talonavicular joint is maintained. No navicular fracture. The tarsal and metatarsal bones are intact. Mildly displaced longitudinal fracture through the lateral cortex of the distal fibula. The tibiotalar joint is maintained. IMPRESSION: 1. Complex comminuted central depression type fracture of the calcaneus with multiple fracture lines. 2. Longitudinal/vertical fracture through the base of the sustentaculum with approximately 3 mm of displacement. 3.  Mildly displaced longitudinal fracture through the lateral cortex of the distal fibula. Electronically Signed   By: Marijo Sanes M.D.   On: 12/22/2019 16:26   CT FOOT RIGHT WO CONTRAST  Result Date: 12/22/2019 CLINICAL DATA:  [place] from a ladder. EXAM: CT OF THE RIGHT ANKLE AND RIGHT FOOT WITHOUT CONTRAST TECHNIQUE: Multidetector CT imaging of the right ankle was performed according to the standard protocol. Multiplanar CT image reconstructions were also generated. COMPARISON:  Radiographs, same date. FINDINGS: Complex comminuted central depression type fracture of the calcaneus with multiple fracture lines. The posterior facet is comminuted and impacted and slightly rotated dorsally. Longitudinal/vertical fracture through the base of the sustentacular limb with approximately 3 mm of displacement. The anterior process of the calcaneus is intact. The tibiotalar joint is maintained. No fracture of the tibia or talus is identified. The talonavicular joint is maintained. No navicular fracture. The tarsal and metatarsal bones are intact. Mildly displaced longitudinal fracture through the lateral cortex of the distal fibula. The tibiotalar joint is maintained. IMPRESSION: 1. Complex comminuted central depression type fracture of the calcaneus with multiple fracture lines. 2. Longitudinal/vertical fracture through the base of the sustentaculum with approximately 3 mm of displacement. 3. Mildly displaced longitudinal fracture through the lateral cortex of the distal fibula. Electronically Signed   By: Marijo Sanes M.D.   On: 12/22/2019 16:26   DG Knee Complete 4 Views Left  Result Date: 12/22/2019 CLINICAL DATA:  Fall from a 12 foot ladder. Multiple injuries. Left knee pain. EXAM: LEFT KNEE - COMPLETE 4+ VIEW COMPARISON:  None. FINDINGS: There are comminuted fractures of the proximal tibia as well as the proximal fibula. Fracture of the tibia extends transversely across the metaphysis with additional fractures  extending to the medial articular margin of the lateral tibial plateau. Fractures show mild displacement, largest lateral component displaced approximately 1.5 cm laterally. There is also a comminuted fracture of the proximal fibula centered on the metaphysis, with no significant displacement. Knee joint is normally aligned. There is mild depression of the lateral tibial plateau articular surface. No femur fracture. There is a joint effusion with a fat fluid level. Surrounding soft tissue swelling is  noted of the proximal leg. IMPRESSION: 1. Comminuted, mildly displaced fractures of the proximal left tibia intersecting the lateral tibial plateau articular surface. Comminuted nondisplaced fractures of the proximal fibula. No dislocation. Electronically Signed   By: Lajean Manes M.D.   On: 12/22/2019 14:20   DG Knee Complete 4 Views Right  Result Date: 12/22/2019 CLINICAL DATA:  [place] from a 12 foot ladder.  Right knee pain. EXAM: RIGHT KNEE - COMPLETE 4+ VIEW COMPARISON:  None. FINDINGS: Nondisplaced oblique fracture of the proximal fibular shaft. No other fractures. Knee joint is normally spaced and aligned. Probable small joint effusion. Soft tissues otherwise unremarkable. IMPRESSION: Nondisplaced, nonangulated fracture of the proximal right fibular shaft. No other fractures. Knee joint normally aligned. Electronically Signed   By: Lajean Manes M.D.   On: 12/22/2019 14:21    Procedures Procedures (including critical care time)  Medications Ordered in ED Medications  methocarbamol (ROBAXIN) tablet 500 mg (has no administration in time range)    Or  methocarbamol (ROBAXIN) 500 mg in dextrose 5 % 50 mL IVPB (has no administration in time range)  ondansetron (ZOFRAN) tablet 4 mg ( Oral See Alternative 12/22/19 1642)    Or  ondansetron (ZOFRAN) injection 4 mg (4 mg Intravenous Given 12/22/19 1642)  oxyCODONE (Oxy IR/ROXICODONE) immediate release tablet 5-15 mg (has no administration in time range)    morphine 4 MG/ML injection 4 mg (4 mg Intravenous Given 12/22/19 1645)  sertraline (ZOLOFT) tablet 100 mg (100 mg Oral Not Given 12/22/19 1732)  pantoprazole (PROTONIX) EC tablet 40 mg (40 mg Oral Not Given 12/22/19 1732)  cloBAZam (ONFI) tablet 20 mg (has no administration in time range)  lamoTRIgine (LAMICTAL) tablet 150 mg (has no administration in time range)  levETIRAcetam (KEPPRA) tablet 750 mg (has no administration in time range)  zonisamide (ZONEGRAN) capsule 400 mg (400 mg Oral Not Given 12/22/19 1732)  fentaNYL (SUBLIMAZE) injection 50 mcg (50 mcg Intravenous Given 12/22/19 1346)  iohexol (OMNIPAQUE) 300 MG/ML solution 100 mL (100 mLs Intravenous Contrast Given 12/22/19 1749)    ED Course  I have reviewed the triage vital signs and the nursing notes.  Pertinent labs & imaging results that were available during my care of the patient were reviewed by me and considered in my medical decision making (see chart for details).    MDM Rules/Calculators/A&P                      Patient presenting for evaluation after a fall.  On exam, patient has an obvious deformity of the right ankle and the left knee.  However considering mechanism of fall, also concern for spinal injury even though she has no pain at this time.  Due to distracting injury, patient remains in a c-collar.  Will order basic labs, CT head, CT C-spine, portable chest, and x-rays of bilateral knees and right ankle. Case discussed with attending, Dr. Langston Masker agrees to plan.  X-rays viewed interpreted by me, shows comminuted displaced fracture of the left proximal tib-fib, right distal and proximal fibular fracture, and right calcaneus fracture.  Chest x-ray does not show obvious fracture or pulmonary injury.  Will consult with trauma due to multiple fractures, as well as talk to orthopedics for further evaluation.   Discussed with trauma surgery who states that as of now, all of the injuries are orthopedics, and thus Ortho can admit.  If  patient is found to have further trauma or injuries, trauma team can consult.  Discussed with M.  Jacqulynn Cadet, PA-C who will evaluate the patient.  Ortho to admit.  CT head and neck negative for acute intracranial or cervical injuries.  It is noted that patient has a first rib fracture on the right side.  As such, will obtain CT chest abdomen pelvis and consult with trauma surgery.  Discussed with Dr. Redmond Pulling, surgery, who will consult while patient is admitted.  Final Clinical Impression(s) / ED Diagnoses Final diagnoses:  Closed fracture of left tibial plateau, initial encounter  Closed fracture of proximal end of left fibula, unspecified fracture morphology, initial encounter  Closed fracture of proximal end of right fibula, unspecified fracture morphology, initial encounter  Closed fracture of distal end of right fibula, unspecified fracture morphology, initial encounter  Closed displaced fracture of right calcaneus, unspecified portion of calcaneus, initial encounter  Closed fracture of one rib of right side, initial encounter  Fall, initial encounter    Rx / DC Orders ED Discharge Orders    None       Franchot Heidelberg, PA-C 12/22/19 1903    Wyvonnia Dusky, MD 12/23/19 [id number]

## 2019-12-22 NOTE — Anesthesia Postprocedure Evaluation (Signed)
Anesthesia Post Note  Patient: Cindy Mcclure  Procedure(s) Performed: LEFT KNEE SPANNING EXTERNAL FIXATION (Left )     Patient location during evaluation: PACU Anesthesia Type: General Level of consciousness: awake Pain management: pain level controlled Respiratory status: spontaneous breathing Cardiovascular status: stable Postop Assessment: no apparent nausea or vomiting Anesthetic complications: no    Last Vitals:  Vitals:   12/22/19 1951 12/22/19 2320  BP: 110/70 140/85  Pulse: 92 90  Resp:  12  Temp: 36.8 C 36.6 C  SpO2: 99% 93%    Last Pain:  Vitals:   12/22/19 1951  TempSrc: Oral  PainSc:                  [name]

## 2019-12-22 NOTE — Op Note (Signed)
   Date of Surgery: 12/22/2019  INDICATIONS: [name] is a 54 y.o.-year-old female who sustained a left proximal tibia  fracture; she was indicated for external fixation due to the displaced and unstable nature of the fracture and came to the operating room today for this procedure. The patient did consent to the procedure after discussion of the risks and benefits.   PREOPERATIVE DIAGNOSIS: left bicondylar tibial plateau fracture   POSTOPERATIVE DIAGNOSIS: Same.  PROCEDURE: External fixation left spanning knee. CPT 20690 uniplane  SURGEON: N. Eduard Roux, M.D.  ASSIST: none  ANESTHESIA: general  IV FLUIDS AND URINE: See anesthesia.  ESTIMATED BLOOD LOSS: minimal mL.  IMPLANTS: Zimmer  DRAINS: None.  COMPLICATIONS: see description of procedure.  DESCRIPTION OF PROCEDURE: The patient was identified in the preoperative holding area.  The operative site was marked by the surgeon and confirmed by the patient.  He was brought back to the operating room.  Anesthesia was induced by the anesthesia team.  A well padded nonsterile tourniquet was placed. The operative extremity was prepped and draped in standard sterile fashion.  A timeout was performed.  Preoperative antibiotics were given.  The bony landmarks were palpated on the tibia and fluoroscopy was used to mark the femoral pin sites and the pin sites were marked on the skin.  Each Schanz pin was placed in the same fashion -- first drilling with the 3.5 mm drill while copiously irrigating, then hand placing the pin.  This was confirmed on x-ray on both views.  The ex-fix clamps were placed onto pins and the fracture was pulled out to length.  The clamps were completely tightened.   Final x-rays were taken in AP and lateral views to confirm the reduction and pin lengths. The wounds were cleaned and dried a final time and a sterile dressing consisting of Xeroform and kerlix was placed.  Of note, the patient's compartments remained soft  throughout the procedure.  The patient was then transferred back to the bed and left the operating room in stable condition.  All sponge and instrument counts were correct.  POSTOPERATIVE PLAN: [name] will remain non weight bearing with the leg elevated.  she will return to the operating room for definitive fixation when the swelling has gone down.  [name] will receive DVT prophylaxis based on other medications, activity level, and risk ratio of bleeding to thrombosis.  Pin site care will be initiated on postoperative day one.  Azucena Cecil, MD [facility] 11:12 PM

## 2019-12-22 NOTE — Progress Notes (Signed)
Orthopedic Tech Progress Note Patient Details:  Cindy Mcclure 1966/06/03 [id number] Applied with RN assistance  Ortho Devices Type of Ortho Device: Knee Immobilizer, Short leg splint Ortho Device/Splint Location: LLE Ortho Device/Splint Interventions: Ordered, Application   Post Interventions Patient Tolerated: Well Instructions Provided: Care of [place] 12/22/2019, 4:41 PM

## 2019-12-22 NOTE — Anesthesia Preprocedure Evaluation (Addendum)
Anesthesia Evaluation  Patient identified by MRN, date of birth, ID band Patient awake    Reviewed: Allergy & Precautions, H&P , NPO status , Patient's Chart, lab work & pertinent test results  Airway Mallampati: II  TM Distance: >3 FB     Dental no notable dental hx.    Pulmonary neg pulmonary ROS, former smoker,    Pulmonary exam normal breath sounds clear to auscultation       Cardiovascular + angina  Rhythm:Regular Rate:Normal     Neuro/Psych Seizures -, Well Controlled,  Anxiety Depression    GI/Hepatic Neg liver ROS, GERD  ,  Endo/Other  negative endocrine ROS  Renal/GU negative Renal ROS  negative genitourinary   Musculoskeletal   Abdominal   Peds  Hematology negative hematology ROS (+)   Anesthesia Other Findings   Reproductive/Obstetrics negative OB ROS                            Anesthesia Physical Anesthesia Plan  ASA: III  Anesthesia Plan: General   Post-op Pain Management:    Induction: Intravenous, Rapid sequence and Cricoid pressure planned  PONV Risk Score and Plan: 4 or greater and Ondansetron, Dexamethasone and Midazolam  Airway Management Planned: Oral ETT  Additional Equipment:   Intra-op Plan:   Post-operative Plan: Extubation in OR  Informed Consent: I have reviewed the patients History and Physical, chart, labs and discussed the procedure including the risks, benefits and alternatives for the proposed anesthesia with the patient or authorized representative who has indicated his/her understanding and acceptance.     Dental advisory given  Plan Discussed with: CRNA and Anesthesiologist  Anesthesia Plan Comments:        Anesthesia Quick Evaluation

## 2019-12-22 NOTE — Transfer of Care (Signed)
Immediate Anesthesia Transfer of Care Note  Patient: Cindy Mcclure  Procedure(s) Performed: LEFT KNEE SPANNING EXTERNAL FIXATION (Left )  Patient Location: PACU  Anesthesia Type:General  Level of Consciousness: awake, alert , oriented and patient cooperative  Airway & Oxygen Therapy: Patient Spontanous Breathing and Patient connected to nasal cannula oxygen  Post-op Assessment: Report given to RN and Post -op Vital signs reviewed and stable  Post vital signs: Reviewed and stable  Last Vitals:  Vitals Value Taken Time  BP 140/85 12/22/19 2323  Temp 36.6 C 12/22/19 2320  Pulse 96 12/22/19 2326  Resp 15 12/22/19 2326  SpO2 97 % 12/22/19 2326  Vitals shown include unvalidated device data.  Last Pain:  Vitals:   12/22/19 1951  TempSrc: Oral  PainSc:          Complications: No apparent anesthesia complications

## 2019-12-22 NOTE — ED Triage Notes (Signed)
Pt here from home for fall from 55ft ladder. Pt was on second to last step, daughter startled her and she fell off, landed on feet. Swelling to medial R ankle, and below L knee. Pt c/o 10/10 pain in L leg, denies head, neck, or back pain.. AOx4, vss.

## 2019-12-22 NOTE — ED Notes (Signed)
Pt transported to xray 

## 2019-12-22 NOTE — Progress Notes (Addendum)
Orthopedic Tech Progress Note Patient Details:  Cindy Mcclure [birth year] [id number] Came to apply splint and she has gone to CT. Told RN to let me know when patient returns Patient ID: Cindy Mcclure, female   DOB: Nov 28, 1965, 54 y.o.   MRN: [id number]   Cindy Mcclure 12/22/2019, 3:36 PM

## 2019-12-22 NOTE — Anesthesia Procedure Notes (Signed)
Procedure Name: Intubation Date/Time: 12/22/2019 10:15 PM Performed by: Shirlyn Goltz, CRNA Pre-anesthesia Checklist: Patient identified, Emergency Drugs available, Suction available and Patient being monitored Patient Re-evaluated:Patient Re-evaluated prior to induction Oxygen Delivery Method: Circle system utilized Preoxygenation: Pre-oxygenation with 100% oxygen Induction Type: IV induction Ventilation: Mask ventilation without difficulty Laryngoscope Size: Miller and 2 Grade View: Grade I Tube type: Oral Tube size: 7.0 mm Number of attempts: 1 Airway Equipment and Method: Stylet Placement Confirmation: ETT inserted through vocal cords under direct vision,  positive ETCO2 and breath sounds checked- equal and bilateral Secured at: 21 cm Tube secured with: Tape Dental Injury: Teeth and Oropharynx as per pre-operative assessment

## 2019-12-22 NOTE — Progress Notes (Signed)
Orthopedic Tech Progress Note Patient Details:  Cindy Mcclure 1965-09-14 [id number] MD XU had me remove the SHORT LEG to the LLE and just apply the knee immobilizer and then he had me do a [employer] DRESSING to the RLE with him at the bedside Ortho Devices Type of Ortho Device: Doran Durand splint Ortho Device/Splint Location: RLE Ortho Device/Splint Interventions: Application, Adjustment   Post Interventions Patient Tolerated: Well Instructions Provided: Care of [name] 12/22/2019, 6:06 PM

## 2019-12-22 NOTE — Progress Notes (Addendum)
1800 Received pt from ED. A&O x4. Left upper arm swelling noted. IVF infiltrated in CT scan with CT scan dye. Ice pack in place. I called CT scan dept and the tech said that Dr Posey Pronto- radiologist is aware and recommended ice pack to the site.  Checked Pt's back and buttocks, skin is intact. Kept pt NPO.

## 2019-12-22 NOTE — Consult Note (Signed)
Reason for Consult: Fall, first rib fracture Referring Physician: [place] PA-C  Cindy Mcclure is an 54 y.o. female.  HPI: 54 year old female with history of chronic back pain, degenerative disc disease, GERD, and partial seizure disorder fell off a ladder approximately 8 feet off the ground.  She denies any loss of consciousness.  She complains of right ankle and left knee pain.  Trauma work-up revealed multiple orthopedic fractures.  We were consulted to comment on the rib fracture.  No chest pain or abdominal pain.  No upper extremity pain.  Her left upper extremity IV did infiltrate in CT. no C-spine pain  Past Medical History:  Diagnosis Date  . Allergic rhinitis   . Anginal pain [facility])    sees dr Clayborn Bigness  . Anxiety   . Arthritis    all over...knees, ankles, back  . Back pain   . Cervical radiculitis    no surgery, just cortisone shots.    . Constipation   . Constipation   . DDD (degenerative disc disease), cervical   . Dermatophytosis of nail 2019   both feet  . GERD (gastroesophageal reflux disease)   . History of kidney stones    h/o  . Osteoarthritis   . Panic attacks   . Plantar fasciitis    bilaterally  . Seizures ([place]) 1972   unsure of when she had last seizure due to petit mal type. does not know what brings on a seizure.  . Stress bladder incontinence, female   . Urinary incontinence     Past Surgical History:  Procedure Laterality Date  . CESAREAN SECTION  [id number]  . CHOLECYSTECTOMY N/A 12/06/2015   Procedure: LAPAROSCOPIC CHOLECYSTECTOMY WITH INTRAOPERATIVE CHOLANGIOGRAM;  Surgeon: Dia Crawford III, MD;  Location: [facility] ORS;  Service: General;  Laterality: N/A;  . COLONOSCOPY WITH PROPOFOL N/A 09/29/2016   Procedure: COLONOSCOPY WITH PROPOFOL;  Surgeon: Manya Silvas, MD;  Location: [facility] ENDOSCOPY;  Service: Endoscopy;  Laterality: N/A;  . KNEE ARTHROSCOPY WITH EXCISION PLICA Left [id number]   Procedure: KNEE ARTHROSCOPY WITH EXCISION PLICA, PARTIAL  SYNOVECTOMY;  Surgeon: Hessie Knows, MD;  Location: [facility] ORS;  Service: Orthopedics;  Laterality: Left;  . KNEE ARTHROSCOPY WITH MEDIAL MENISECTOMY Right 09/03/2017   Procedure: KNEE ARTHROSCOPY WITH MEDIAL MENISECTOMY, PARTIAL SYNOVECTOMY;  Surgeon: Hessie Knows, MD;  Location: [facility] ORS;  Service: Orthopedics;  Laterality: Right;  . SHOULDER ARTHROSCOPY WITH DEBRIDEMENT AND BICEP TENDON REPAIR Left 03/22/2019   Procedure: SHOULDER ARTHROSCOPY WITH DEBRIDEMENT, ROTATOR CUFF REPAIR, AND BICEP TENDON REPAIR;  Surgeon: Corky Mull, MD;  Location: [facility] ORS;  Service: Orthopedics;  Laterality: Left;  . SUBACROMIAL DECOMPRESSION  03/22/2019   Procedure: SUBACROMIAL DECOMPRESSION;  Surgeon: Corky Mull, MD;  Location: [facility] ORS;  Service: Orthopedics;;  . SURGERY FOR SEIZURES     nothing implanted in head. she was 54 years old. done [facility]  . TUBAL LIGATION  2003    Family History  Problem Relation Age of Onset  . Anxiety disorder Daughter     Social History:  reports that she quit smoking about 7 years ago. Her smoking use included cigarettes. She smoked 0.25 packs per day. She has never used smokeless tobacco. She reports current alcohol use. She reports that she does not use drugs.  Allergies: No Known Allergies  Medications: I have reviewed the patient's current medications.  Results for orders placed or performed during the hospital encounter of 12/22/19 (from the past 48 hour(s))  CBC     Status: Abnormal  Collection Time: 12/22/19  2:36 PM  Result Value Ref Range   WBC 18.1 (H) 4.0 - 10.5 K/uL   RBC 3.87 3.87 - 5.11 MIL/uL   Hemoglobin 11.9 (L) 12.0 - 15.0 g/dL   HCT 37.4 36.0 - 46.0 %   MCV 96.6 80.0 - 100.0 fL   MCH 30.7 26.0 - 34.0 pg   MCHC 31.8 30.0 - 36.0 g/dL   RDW 13.3 11.5 - 15.5 %   Platelets 375 150 - 400 K/uL   nRBC 0.0 0.0 - 0.2 %    Comment: Performed at [facility], [place] [address] [place], [place] [address]  Comprehensive metabolic panel     Status:  Abnormal   Collection Time: 12/22/19  2:36 PM  Result Value Ref Range   Sodium 138 135 - 145 mmol/L   Potassium 3.8 3.5 - 5.1 mmol/L   Chloride 108 98 - 111 mmol/L   CO2 23 22 - 32 mmol/L   Glucose, Bld 136 (H) 70 - 99 mg/dL    Comment: Glucose reference range applies only to samples taken after fasting for at least 8 hours.   BUN 20 6 - 20 mg/dL   Creatinine, Ser 1.17 (H) 0.44 - 1.00 mg/dL   Calcium 8.4 (L) 8.9 - 10.3 mg/dL   Total Protein 6.7 6.5 - 8.1 g/dL   Albumin 3.7 3.5 - 5.0 g/dL   AST 27 15 - 41 U/L   ALT 20 0 - 44 U/L   Alkaline Phosphatase 96 38 - 126 U/L   Total Bilirubin 0.5 0.3 - 1.2 mg/dL   GFR calc non Af Amer 52 (L) >60 mL/min   GFR calc Af Amer >60 >60 mL/min   Anion gap 7 5 - 15    Comment: Performed at [place] [address], [place], [place] [address]  HIV Antibody (routine testing w rflx)     Status: None   Collection Time: 12/22/19  4:16 PM  Result Value Ref Range   HIV Screen 4th Generation wRfx Non Reactive Non Reactive    Comment: Performed at [facility], [place] [address] [place], [place] [address]  Respiratory Panel by RT PCR (Flu A&B, Covid) - Nasopharyngeal Swab     Status: None   Collection Time: 12/22/19  4:50 PM   Specimen: Nasopharyngeal Swab  Result Value Ref Range   SARS Coronavirus 2 by RT PCR NEGATIVE NEGATIVE    Comment: (NOTE) SARS-CoV-2 target nucleic acids are NOT DETECTED. The SARS-CoV-2 RNA is generally detectable in upper respiratoy specimens during the acute phase of infection. The lowest concentration of SARS-CoV-2 viral copies this assay can detect is 131 copies/mL. A negative result does not preclude SARS-Cov-2 infection and should not be used as the sole basis for treatment or other patient management decisions. A negative result may occur with  improper specimen collection/handling, submission of specimen other than nasopharyngeal swab, presence of viral mutation(s) within the areas targeted by this assay,  and inadequate number of viral copies (<131 copies/mL). A negative result must be combined with clinical observations, patient history, and epidemiological information. The expected result is Negative. Fact Sheet for Patients:  PinkCheek.be Fact Sheet for Healthcare Providers:  GravelBags.it This test is not yet ap proved or cleared by the [place] FDA and  has been authorized for detection and/or diagnosis of SARS-CoV-2 by FDA under an Emergency Use Authorization (EUA). This EUA will remain  in effect (meaning this test can be used) for  the duration of the COVID-19 declaration under Section 564(b)(1) of the Act, 21 U.S.C. section 360bbb-3(b)(1), unless the authorization is terminated or revoked sooner.    Influenza A by PCR NEGATIVE NEGATIVE   Influenza B by PCR NEGATIVE NEGATIVE    Comment: (NOTE) The Xpert Xpress SARS-CoV-2/FLU/RSV assay is intended as an aid in  the diagnosis of influenza from Nasopharyngeal swab specimens and  should not be used as a sole basis for treatment. Nasal washings and  aspirates are unacceptable for Xpert Xpress SARS-CoV-2/FLU/RSV  testing. Fact Sheet for Patients: PinkCheek.be Fact Sheet for Healthcare Providers: GravelBags.it This test is not yet approved or cleared by the [place] FDA and  has been authorized for detection and/or diagnosis of SARS-CoV-2 by  FDA under an Emergency Use Authorization (EUA). This EUA will remain  in effect (meaning this test can be used) for the duration of the  Covid-19 declaration under Section 564(b)(1) of the Act, 21  U.S.C. section 360bbb-3(b)(1), unless the authorization is  terminated or revoked. Performed at [facility], [place] [address], [place], [place] [address]     DG Chest 1 View  Result Date: 12/22/2019 CLINICAL DATA:  Fall from ladder. EXAM: CHEST  1 VIEW COMPARISON:   December 05, 2015. FINDINGS: The heart size and mediastinal contours are within normal limits. Both lungs are clear. No pneumothorax or pleural effusion is noted. The visualized skeletal structures are unremarkable. IMPRESSION: No active disease. Electronically Signed   By: Marijo Conception M.D.   On: 12/22/2019 14:19   DG Ankle Complete Right  Result Date: 12/22/2019 CLINICAL DATA:  Fall from ladder. EXAM: RIGHT ANKLE - COMPLETE 3+ VIEW COMPARISON:  None. FINDINGS: Comminuted fracture involving the calcaneus with mild displacement along the plantar aspect. The fracture extends to the subtalar joint. In addition, the calcaneal fracture involves the anterior process of the calcaneus. The right ankle appears to be located but there is comminuted fracture involving the distal fibula with displaced lateral fragment or fragments. Extensive soft tissue swelling involving the lower leg and ankle region. No gross abnormality to the talar dome. Medial malleolus is intact. IMPRESSION: 1. Comminuted fractures involving the calcaneus and the distal fibula. The fractures could be better characterized with CT to exclude additional injuries. 2. Soft tissue swelling in the lower leg and ankle. Electronically Signed   By: Markus Daft M.D.   On: 12/22/2019 14:23   CT Head Wo Contrast  Result Date: 12/22/2019 CLINICAL DATA:  Patient fell from 12 foot ladder. Patient fell off, landing on her feet. Swelling in the MEDIAL RIGHT ankle and below LEFT knee. EXAM: CT HEAD WITHOUT CONTRAST CT CERVICAL SPINE WITHOUT CONTRAST TECHNIQUE: Multidetector CT imaging of the head and cervical spine was performed following the standard protocol without intravenous contrast. Multiplanar CT image reconstructions of the cervical spine were also generated. COMPARISON:  MR cervical spine 01/19/2015, CT head on 06/16/2018 FINDINGS: CT HEAD FINDINGS Brain: Stable, diffuse cerebellar atrophy. Focal low-attenuation within the RIGHT frontal lobe is consistent  with chronic ischemic change. There is no intra or extra-axial fluid collection or mass lesion. The basilar cisterns and ventricles have a normal appearance. There is no CT evidence for acute infarction or hemorrhage. Vascular: No hyperdense vessel or unexpected calcification. Skull: Normal. Negative for fracture or focal lesion. Sinuses/Orbits: No acute finding. Other: None CT CERVICAL SPINE FINDINGS Alignment: Normal. Skull base and vertebrae: No acute fracture. No primary bone lesion or focal pathologic process. Soft tissues and spinal canal: No  prevertebral fluid or swelling. No visible canal hematoma. Disc levels: There is disc height loss associated with uncovertebral spurring primarily at C6-7. Upper chest: There is a fracture of the proximal RIGHT first rib. Other: None IMPRESSION: 1. No evidence for acute intracranial abnormality. 2. Stable, diffuse cerebellar atrophy. 3. Chronic RIGHT frontal lobe infarct. 4. No evidence for acute cervical spine abnormality. 5. Fracture of the proximal RIGHT first rib. These results were called by telephone at the time of interpretation on 12/22/2019 at 4:26 pm to provider [facility] , who verbally acknowledged these results. Electronically Signed   By: Nolon Nations M.D.   On: 12/22/2019 16:27   CT Chest W Contrast  Result Date: 12/22/2019 CLINICAL DATA:  [place] from 12 foot ladder, right ankle fracture, left knee fracture EXAM: CT CHEST, ABDOMEN, AND PELVIS WITH CONTRAST TECHNIQUE: Multidetector CT imaging of the chest, abdomen and pelvis was performed following the standard protocol during bolus administration of intravenous contrast. CONTRAST:  168mL OMNIPAQUE IOHEXOL 300 MG/ML  SOLN COMPARISON:  None. FINDINGS: CT CHEST FINDINGS Cardiovascular: Heart and great vessels are unremarkable with no evidence of vascular injury. No pericardial effusion. Mediastinum/Nodes: No enlarged mediastinal, hilar, or axillary lymph nodes. Thyroid gland, trachea, and esophagus  demonstrate no significant findings. Lungs/Pleura: No airspace disease, effusion, or pneumothorax. Central airways are patent. Musculoskeletal: A minimally displaced posterior right first rib fracture is better seen on the corresponding CT cervical spine evaluation. There are no other acute displaced fractures. Reconstructed images demonstrate no additional findings. CT ABDOMEN PELVIS FINDINGS Hepatobiliary: No hepatic injury or perihepatic hematoma. The gallbladder is surgically absent. Pancreas: Unremarkable. No pancreatic ductal dilatation or surrounding inflammatory changes. Spleen: No splenic injury or perisplenic hematoma. Adrenals/Urinary Tract: No adrenal hemorrhage or renal injury identified. Bladder is unremarkable. Stomach/Bowel: No bowel obstruction or ileus. No bowel wall thickening or inflammatory change. Vascular/Lymphatic: No significant vascular findings are present. No enlarged abdominal or pelvic lymph nodes. Reproductive: 2.2 cm hypodensity ventral lower uterine segment consistent with fibroid. Otherwise the uterus and adnexal structures are age-appropriate. Other: No abdominal wall hernia or abnormality. No abdominopelvic ascites. Musculoskeletal: No acute displaced fractures. Reconstructed images demonstrate no additional findings. IMPRESSION: 1. Minimally displaced posterior right first rib fracture better seen on the corresponding CT cervical spine evaluation. 2. Otherwise no acute intrathoracic, intra-abdominal or intrapelvic injury. Electronically Signed   By: Randa Ngo M.D.   On: 12/22/2019 17:59   CT Cervical Spine Wo Contrast  Result Date: 12/22/2019 CLINICAL DATA:  Patient fell from 12 foot ladder. Patient fell off, landing on her feet. Swelling in the MEDIAL RIGHT ankle and below LEFT knee. EXAM: CT HEAD WITHOUT CONTRAST CT CERVICAL SPINE WITHOUT CONTRAST TECHNIQUE: Multidetector CT imaging of the head and cervical spine was performed following the standard protocol without  intravenous contrast. Multiplanar CT image reconstructions of the cervical spine were also generated. COMPARISON:  MR cervical spine 01/19/2015, CT head on 06/16/2018 FINDINGS: CT HEAD FINDINGS Brain: Stable, diffuse cerebellar atrophy. Focal low-attenuation within the RIGHT frontal lobe is consistent with chronic ischemic change. There is no intra or extra-axial fluid collection or mass lesion. The basilar cisterns and ventricles have a normal appearance. There is no CT evidence for acute infarction or hemorrhage. Vascular: No hyperdense vessel or unexpected calcification. Skull: Normal. Negative for fracture or focal lesion. Sinuses/Orbits: No acute finding. Other: None CT CERVICAL SPINE FINDINGS Alignment: Normal. Skull base and vertebrae: No acute fracture. No primary bone lesion or focal pathologic process. Soft tissues and spinal canal: No  prevertebral fluid or swelling. No visible canal hematoma. Disc levels: There is disc height loss associated with uncovertebral spurring primarily at C6-7. Upper chest: There is a fracture of the proximal RIGHT first rib. Other: None IMPRESSION: 1. No evidence for acute intracranial abnormality. 2. Stable, diffuse cerebellar atrophy. 3. Chronic RIGHT frontal lobe infarct. 4. No evidence for acute cervical spine abnormality. 5. Fracture of the proximal RIGHT first rib. These results were called by telephone at the time of interpretation on 12/22/2019 at 4:26 pm to provider [facility] , who verbally acknowledged these results. Electronically Signed   By: Nolon Nations M.D.   On: 12/22/2019 16:27   CT Lumbar Spine Wo Contrast  Result Date: 12/22/2019 CLINICAL DATA:  Low back pain status post trauma. Fall from 12 foot ladder. EXAM: CT LUMBAR SPINE WITHOUT CONTRAST TECHNIQUE: Multidetector CT imaging of the lumbar spine was performed without intravenous contrast administration. Multiplanar CT image reconstructions were also generated. COMPARISON:  None. FINDINGS:  Segmentation: 5 lumbar type vertebrae. Alignment: Normal. Vertebrae: No acute fracture or focal pathologic process. Paraspinal and other soft tissues: The patient is status post prior cholecystectomy. The visualized portions of the kidneys are unremarkable. There is no evidence for an abdominal aortic aneurysm. Disc levels: The disc heights are relatively well preserved throughout the lumbar spine. IMPRESSION: No acute osseous abnormality of the lumbar spine. Electronically Signed   By: Constance Holster M.D.   On: 12/22/2019 16:19   CT KNEE LEFT WO CONTRAST  Result Date: 12/22/2019 CLINICAL DATA:  [place] from a ladder. EXAM: CT OF THE left KNEE WITHOUT CONTRAST TECHNIQUE: Multidetector CT imaging of the left knee was performed according to the standard protocol. Multiplanar CT image reconstructions were also generated. COMPARISON:  Radiographs, same date. FINDINGS: Complex comminuted fractures of the tibial plateau. There is a large inverted Y shaped intra-articular fracture through the mid portion of the lateral tibial plateau splitting the plateau in half and then both arms extend out through the medial and lateral cortices near the metadiaphyseal junction. Maximum depression of the lateral plateau fracture is 6.4 mm. The tibial spines are severely comminuted with numerous small fracture fragments. The medial tibial plateau fracture is an oblique coursing fracture through the anterior third of the plateau and extending posteriorly out through the posterior cortex. Complex comminuted fracture of the proximal fibula including the head neck and proximal shaft. No significant displacement. The femur is intact. No patellar fracture. Large lipohemarthrosis. Grossly by CT the cruciate and collateral ligaments are intact. But exam is limited. The quadriceps and patellar tendons are intact. IMPRESSION: 1. Complex comminuted fractures of the tibial plateau as described above. 2. Complex comminuted fracture of the  proximal fibula. 3. Large lipohemarthrosis. 4. Grossly by CT the cruciate and collateral ligaments are intact. But exam is limited. Electronically Signed   By: Marijo Sanes M.D.   On: 12/22/2019 16:32   CT ABDOMEN PELVIS W CONTRAST  Result Date: 12/22/2019 CLINICAL DATA:  [place] from 12 foot ladder, right ankle fracture, left knee fracture EXAM: CT CHEST, ABDOMEN, AND PELVIS WITH CONTRAST TECHNIQUE: Multidetector CT imaging of the chest, abdomen and pelvis was performed following the standard protocol during bolus administration of intravenous contrast. CONTRAST:  142mL OMNIPAQUE IOHEXOL 300 MG/ML  SOLN COMPARISON:  None. FINDINGS: CT CHEST FINDINGS Cardiovascular: Heart and great vessels are unremarkable with no evidence of vascular injury. No pericardial effusion. Mediastinum/Nodes: No enlarged mediastinal, hilar, or axillary lymph nodes. Thyroid gland, trachea, and esophagus demonstrate no significant findings. Lungs/Pleura:  No airspace disease, effusion, or pneumothorax. Central airways are patent. Musculoskeletal: A minimally displaced posterior right first rib fracture is better seen on the corresponding CT cervical spine evaluation. There are no other acute displaced fractures. Reconstructed images demonstrate no additional findings. CT ABDOMEN PELVIS FINDINGS Hepatobiliary: No hepatic injury or perihepatic hematoma. The gallbladder is surgically absent. Pancreas: Unremarkable. No pancreatic ductal dilatation or surrounding inflammatory changes. Spleen: No splenic injury or perisplenic hematoma. Adrenals/Urinary Tract: No adrenal hemorrhage or renal injury identified. Bladder is unremarkable. Stomach/Bowel: No bowel obstruction or ileus. No bowel wall thickening or inflammatory change. Vascular/Lymphatic: No significant vascular findings are present. No enlarged abdominal or pelvic lymph nodes. Reproductive: 2.2 cm hypodensity ventral lower uterine segment consistent with fibroid. Otherwise the uterus and  adnexal structures are age-appropriate. Other: No abdominal wall hernia or abnormality. No abdominopelvic ascites. Musculoskeletal: No acute displaced fractures. Reconstructed images demonstrate no additional findings. IMPRESSION: 1. Minimally displaced posterior right first rib fracture better seen on the corresponding CT cervical spine evaluation. 2. Otherwise no acute intrathoracic, intra-abdominal or intrapelvic injury. Electronically Signed   By: Randa Ngo M.D.   On: 12/22/2019 17:59   CT Ankle Right Wo Contrast  Result Date: 12/22/2019 CLINICAL DATA:  [place] from a ladder. EXAM: CT OF THE RIGHT ANKLE AND RIGHT FOOT WITHOUT CONTRAST TECHNIQUE: Multidetector CT imaging of the right ankle was performed according to the standard protocol. Multiplanar CT image reconstructions were also generated. COMPARISON:  Radiographs, same date. FINDINGS: Complex comminuted central depression type fracture of the calcaneus with multiple fracture lines. The posterior facet is comminuted and impacted and slightly rotated dorsally. Longitudinal/vertical fracture through the base of the sustentacular limb with approximately 3 mm of displacement. The anterior process of the calcaneus is intact. The tibiotalar joint is maintained. No fracture of the tibia or talus is identified. The talonavicular joint is maintained. No navicular fracture. The tarsal and metatarsal bones are intact. Mildly displaced longitudinal fracture through the lateral cortex of the distal fibula. The tibiotalar joint is maintained. IMPRESSION: 1. Complex comminuted central depression type fracture of the calcaneus with multiple fracture lines. 2. Longitudinal/vertical fracture through the base of the sustentaculum with approximately 3 mm of displacement. 3. Mildly displaced longitudinal fracture through the lateral cortex of the distal fibula. Electronically Signed   By: Marijo Sanes M.D.   On: 12/22/2019 16:26   CT FOOT RIGHT WO CONTRAST  Result  Date: 12/22/2019 CLINICAL DATA:  [place] from a ladder. EXAM: CT OF THE RIGHT ANKLE AND RIGHT FOOT WITHOUT CONTRAST TECHNIQUE: Multidetector CT imaging of the right ankle was performed according to the standard protocol. Multiplanar CT image reconstructions were also generated. COMPARISON:  Radiographs, same date. FINDINGS: Complex comminuted central depression type fracture of the calcaneus with multiple fracture lines. The posterior facet is comminuted and impacted and slightly rotated dorsally. Longitudinal/vertical fracture through the base of the sustentacular limb with approximately 3 mm of displacement. The anterior process of the calcaneus is intact. The tibiotalar joint is maintained. No fracture of the tibia or talus is identified. The talonavicular joint is maintained. No navicular fracture. The tarsal and metatarsal bones are intact. Mildly displaced longitudinal fracture through the lateral cortex of the distal fibula. The tibiotalar joint is maintained. IMPRESSION: 1. Complex comminuted central depression type fracture of the calcaneus with multiple fracture lines. 2. Longitudinal/vertical fracture through the base of the sustentaculum with approximately 3 mm of displacement. 3. Mildly displaced longitudinal fracture through the lateral cortex of the distal fibula. Electronically Signed  By: Marijo Sanes M.D.   On: 12/22/2019 16:26   DG Knee Complete 4 Views Left  Result Date: 12/22/2019 CLINICAL DATA:  Fall from a 12 foot ladder. Multiple injuries. Left knee pain. EXAM: LEFT KNEE - COMPLETE 4+ VIEW COMPARISON:  None. FINDINGS: There are comminuted fractures of the proximal tibia as well as the proximal fibula. Fracture of the tibia extends transversely across the metaphysis with additional fractures extending to the medial articular margin of the lateral tibial plateau. Fractures show mild displacement, largest lateral component displaced approximately 1.5 cm laterally. There is also a comminuted  fracture of the proximal fibula centered on the metaphysis, with no significant displacement. Knee joint is normally aligned. There is mild depression of the lateral tibial plateau articular surface. No femur fracture. There is a joint effusion with a fat fluid level. Surrounding soft tissue swelling is noted of the proximal leg. IMPRESSION: 1. Comminuted, mildly displaced fractures of the proximal left tibia intersecting the lateral tibial plateau articular surface. Comminuted nondisplaced fractures of the proximal fibula. No dislocation. Electronically Signed   By: Lajean Manes M.D.   On: 12/22/2019 14:20   DG Knee Complete 4 Views Right  Result Date: 12/22/2019 CLINICAL DATA:  [place] from a 12 foot ladder.  Right knee pain. EXAM: RIGHT KNEE - COMPLETE 4+ VIEW COMPARISON:  None. FINDINGS: Nondisplaced oblique fracture of the proximal fibular shaft. No other fractures. Knee joint is normally spaced and aligned. Probable small joint effusion. Soft tissues otherwise unremarkable. IMPRESSION: Nondisplaced, nonangulated fracture of the proximal right fibular shaft. No other fractures. Knee joint normally aligned. Electronically Signed   By: Lajean Manes M.D.   On: 12/22/2019 14:21    Review of Systems Blood pressure 110/70, pulse 92, temperature 98.2 F (36.8 C), temperature source Oral, resp. rate 17, height 5' (1.524 m), weight 77.1 kg, SpO2 99 %. Physical Exam Constitutional: NAD; conversant; no deformities Eyes: Moist conjunctiva; no lid lag; anicteric; PERRL Neck: Trachea midline; no thyromegaly Lungs: Normal respiratory effort; no tactile fremitus CV: RRR; no palpable thrills; no pitting edema; pulses b/l radial, femoral and DP intact/present GI: Abd soft, nontender, nondistended; no palpable hepatosplenomegaly MSK: RLE - right ankle - tender to palp, has swelling/bruising, decreased ROM to pain; Left knee- large hematoma with bruising, tender to palpation ; no clubbing/cyanosis Psychiatric:  Appropriate affect; alert and oriented x3 Lymphatic: No palpable cervical or axillary lymphadenopathy Skin: no rash, no cellulitis, no induration; has bruising/swelling/ecchymosis as described above; LUE - swelling in proximal LUE - not tight  Assessment/Plan: Fall off of ladder Right first rib fracture Right comminuted ankle fracture Left tibial plateau fracture Left fibula fracture Right fibula fracture History of partial seizure disorder Chronic back pain  Pulmonary toilet, incentive spirometer and flutter valve for rib fracture Orthopedic fracture management per orthopedic trauma Chemical DVT prophylaxis Continue home medications especially for her seizure disorder  Leighton Ruff. Redmond Pulling, MD, FACS General, Bariatric, & Minimally Invasive Surgery [facility]    Greer Pickerel 12/22/2019, 7:53 PM

## 2019-12-23 ENCOUNTER — Inpatient Hospital Stay (HOSPITAL_COMMUNITY): Payer: 59

## 2019-12-23 MED ORDER — MORPHINE SULFATE (PF) 2 MG/ML IV SOLN
0.5000 mg | INTRAVENOUS | Status: DC | PRN
  Administered 2019-12-24: 1 mg via INTRAVENOUS
  Filled 2019-12-23: qty 1

## 2019-12-23 MED ORDER — ONDANSETRON HCL 4 MG/2ML IJ SOLN: 4.0000 mg | Freq: Four times a day (QID) | INTRAMUSCULAR | Status: DC | PRN

## 2019-12-23 MED ORDER — ACETAMINOPHEN 325 MG PO TABS
325.0000 mg | ORAL_TABLET | Freq: Four times a day (QID) | ORAL | Status: DC | PRN
  Administered 2019-12-25: 650 mg via ORAL
  Filled 2019-12-23: qty 2

## 2019-12-23 MED ORDER — HYDROCODONE-ACETAMINOPHEN 7.5-325 MG PO TABS
1.0000 | ORAL_TABLET | ORAL | Status: DC | PRN
  Administered 2019-12-23 – 2019-12-25 (×6): 2 via ORAL
  Administered 2019-12-25: 1 via ORAL
  Filled 2019-12-23: qty 1
  Filled 2019-12-23 (×7): qty 2

## 2019-12-23 MED ORDER — METOCLOPRAMIDE HCL 5 MG/ML IJ SOLN: 5.0000 mg | Freq: Three times a day (TID) | INTRAMUSCULAR | Status: DC | PRN

## 2019-12-23 MED ORDER — ONDANSETRON HCL 4 MG PO TABS
4.0000 mg | ORAL_TABLET | Freq: Four times a day (QID) | ORAL | Status: DC | PRN
  Administered 2020-01-01: 4 mg via ORAL
  Filled 2019-12-23: qty 1

## 2019-12-23 MED ORDER — ACETAMINOPHEN 500 MG PO TABS
500.0000 mg | ORAL_TABLET | Freq: Four times a day (QID) | ORAL | Status: AC
  Administered 2019-12-23: 500 mg via ORAL
  Filled 2019-12-23: qty 1

## 2019-12-23 MED ORDER — DOCUSATE SODIUM 100 MG PO CAPS
100.0000 mg | ORAL_CAPSULE | Freq: Two times a day (BID) | ORAL | Status: DC
  Administered 2019-12-23 – 2020-01-04 (×25): 100 mg via ORAL
  Filled 2019-12-23 (×24): qty 1

## 2019-12-23 MED ORDER — HYDROCODONE-ACETAMINOPHEN 5-325 MG PO TABS
1.0000 | ORAL_TABLET | ORAL | Status: DC | PRN
  Administered 2019-12-23 – 2019-12-25 (×5): 2 via ORAL
  Filled 2019-12-23 (×5): qty 2

## 2019-12-23 MED ORDER — METHOCARBAMOL 500 MG PO TABS
500.0000 mg | ORAL_TABLET | Freq: Four times a day (QID) | ORAL | Status: DC | PRN
  Administered 2019-12-23 – 2020-01-03 (×7): 500 mg via ORAL
  Filled 2019-12-23 (×7): qty 1

## 2019-12-23 MED ORDER — SODIUM CHLORIDE 0.9 % IV SOLN: INTRAVENOUS | Status: DC

## 2019-12-23 MED ORDER — CEFAZOLIN SODIUM-DEXTROSE 2-4 GM/100ML-% IV SOLN
2.0000 g | Freq: Four times a day (QID) | INTRAVENOUS | Status: AC
  Administered 2019-12-23 (×3): 2 g via INTRAVENOUS
  Filled 2019-12-23 (×3): qty 100

## 2019-12-23 MED ORDER — ENOXAPARIN SODIUM 40 MG/0.4ML ~~LOC~~ SOLN
40.0000 mg | Freq: Every day | SUBCUTANEOUS | Status: DC
  Administered 2019-12-23 – 2019-12-25 (×3): 40 mg via SUBCUTANEOUS
  Filled 2019-12-23 (×3): qty 0.4

## 2019-12-23 MED ORDER — METOCLOPRAMIDE HCL 5 MG PO TABS: 5.0000 mg | ORAL_TABLET | Freq: Three times a day (TID) | ORAL | Status: DC | PRN

## 2019-12-23 MED ORDER — MAGNESIUM CITRATE PO SOLN: 1.0000 | Freq: Once | ORAL | Status: DC | PRN

## 2019-12-23 MED ORDER — METHOCARBAMOL 1000 MG/10ML IJ SOLN
500.0000 mg | Freq: Four times a day (QID) | INTRAVENOUS | Status: DC | PRN
  Filled 2019-12-23 (×2): qty 5

## 2019-12-23 MED ORDER — POLYETHYLENE GLYCOL 3350 17 G PO PACK
17.0000 g | PACK | Freq: Every day | ORAL | Status: DC | PRN
  Administered 2019-12-27 – 2019-12-28 (×2): 17 g via ORAL
  Filled 2019-12-23 (×2): qty 1

## 2019-12-23 MED ORDER — SORBITOL 70 % SOLN
30.0000 mL | Freq: Every day | Status: DC | PRN
  Administered 2019-12-28: 30 mL via ORAL
  Filled 2019-12-23: qty 30

## 2019-12-23 MED ORDER — DIPHENHYDRAMINE HCL 12.5 MG/5ML PO ELIX
25.0000 mg | ORAL_SOLUTION | ORAL | Status: DC | PRN
  Administered 2019-12-27 – 2019-12-28 (×2): 25 mg via ORAL
  Filled 2019-12-23 (×2): qty 10

## 2019-12-23 NOTE — H&P (Signed)
See consult note

## 2019-12-23 NOTE — Plan of Care (Signed)

## 2019-12-23 NOTE — Progress Notes (Signed)
Subjective: 1 Day Post-Op Procedure(s) (LRB): External fixation left spanning knee. CPT 20690 uniplane (Left) Patient reports pain as moderate.    Objective: Vital signs in last 24 hours: Temp:  [97.7 F (36.5 C)-98.8 F (37.1 C)] 98.4 F (36.9 C) (05/07 0417) Pulse Rate:  [67-98] 94 (05/07 0417) Resp:  [12-24] 16 (05/07 0417) BP: (102-140)/(62-85) 105/82 (05/07 0417) SpO2:  [93 %-100 %] 100 % (05/07 0417) Weight:  [77.1 kg] 77.1 kg (05/06 1311)  Intake/Output from previous day: 05/06 0701 - 05/07 0700 In: 120.1 [IV Piggyback:20.1] Out: 20 [Blood:20] Intake/Output this shift: No intake/output data recorded.  Recent Labs    12/22/19 1436 12/23/19 0345  HGB 11.9* 11.5*   Recent Labs    12/22/19 1436 12/23/19 0345  WBC 18.1* 15.5*  RBC 3.87 3.71*  HCT 37.4 36.2  PLT 375 328   Recent Labs    12/22/19 1436 12/23/19 0345  NA 138  --   K 3.8  --   CL 108  --   CO2 23  --   BUN 20  --   CREATININE 1.17* 1.04*  GLUCOSE 136*  --   CALCIUM 8.4*  --    No results for input(s): LABPT, INR in the last 72 hours.  Neurologically intact Neurovascular intact Sensation intact distally Intact pulses distally Dorsiflexion/Plantar flexion intact Incision: dressing C/D/I No cellulitis present Compartment soft   Assessment/Plan: 1 Day Post-Op Procedure(s) (LRB): External fixation left spanning knee. CPT 20690 uniplane (Left) Advance diet Up with therapy NWB BLE Strict elevation to BLE Please start pin site care today Will return to OR for definitive fixation once swelling has improved     Aundra Dubin 12/23/2019, 8:15 AM

## 2019-12-23 NOTE — Final Consult Note (Signed)
[facility] Progress Note  1 Day Post-Op  Subjective: Patient reports pain from orthopedic injuries. Denies pain in chest or ribs. Denies SOB. Had not used IS yet or been taught how. No other questions regarding single rib fracture.   Objective: Vital signs in last 24 hours: Temp:  [97.5 F (36.4 C)-98.8 F (37.1 C)] 97.5 F (36.4 C) (05/07 0917) Pulse Rate:  [67-98] 93 (05/07 0917) Resp:  [12-24] 17 (05/07 0917) BP: (102-140)/(62-85) 103/71 (05/07 0917) SpO2:  [93 %-100 %] 96 % (05/07 0917) Weight:  [77.1 kg] 77.1 kg (05/06 1311)    Intake/Output from previous day: 05/06 0701 - 05/07 0700 In: 120.1 [IV Piggyback:20.1] Out: 20 [Blood:20] Intake/Output this shift: No intake/output data recorded.  PE: General: pleasant, WD, WN female who is laying in bed in NAD Heart: regular, rate, and rhythm.  Normal s1,s2. No obvious murmurs, gallops, or rubs noted.  Palpable radial and pedal pulses bilaterally Lungs: CTAB, no wheezes, rhonchi, or rales noted.  Respiratory effort nonlabored. Pulled 1500 on IS Abd: soft, NT, ND, +BS, no masses, hernias, or organomegaly    Lab Results:  Recent Labs    12/22/19 1436 12/23/19 0345  WBC 18.1* 15.5*  HGB 11.9* 11.5*  HCT 37.4 36.2  PLT 375 328   BMET Recent Labs    12/22/19 1436 12/23/19 0345  NA 138  --   K 3.8  --   CL 108  --   CO2 23  --   GLUCOSE 136*  --   BUN 20  --   CREATININE 1.17* 1.04*  CALCIUM 8.4*  --    PT/INR No results for input(s): LABPROT, INR in the last 72 hours. CMP     Component Value Date/Time   NA 138 12/22/2019 1436   NA 140 11/25/2014 1352   K 3.8 12/22/2019 1436   K 3.9 11/25/2014 1352   CL 108 12/22/2019 1436   CL 109 11/25/2014 1352   CO2 23 12/22/2019 1436   CO2 25 11/25/2014 1352   GLUCOSE 136 (H) 12/22/2019 1436   GLUCOSE 113 (H) 11/25/2014 1352   BUN 20 12/22/2019 1436   BUN 15 11/25/2014 1352   CREATININE 1.04 (H) 12/23/2019 0345   CREATININE 0.89 11/25/2014 1352    CALCIUM 8.4 (L) 12/22/2019 1436   CALCIUM 9.0 11/25/2014 1352   PROT 6.7 12/22/2019 1436   ALBUMIN 3.7 12/22/2019 1436   AST 27 12/22/2019 1436   ALT 20 12/22/2019 1436   ALKPHOS 96 12/22/2019 1436   BILITOT 0.5 12/22/2019 1436   GFRNONAA >60 12/23/2019 0345   GFRNONAA >60 11/25/2014 1352   GFRAA >60 12/23/2019 0345   GFRAA >60 11/25/2014 1352   Lipase  No results found for: LIPASE     Studies/Results: DG Chest 1 View  Result Date: 12/22/2019 CLINICAL DATA:  Fall from ladder. EXAM: CHEST  1 VIEW COMPARISON:  December 05, 2015. FINDINGS: The heart size and mediastinal contours are within normal limits. Both lungs are clear. No pneumothorax or pleural effusion is noted. The visualized skeletal structures are unremarkable. IMPRESSION: No active disease. Electronically Signed   By: Marijo Conception M.D.   On: 12/22/2019 14:19   DG Knee 1-2 Views Left  Result Date: 12/22/2019 CLINICAL DATA:  Knee fracture EXAM: LEFT KNEE - 1-2 VIEW; DG C-ARM 1-60 MIN COMPARISON:  CT 12/22/2019 FINDINGS: Six low resolution intraoperative spot views of the left knee. Total fluoroscopy time was 34 seconds. None highly comminuted proximal tibial and fibular fractures.  Placement of external fixating device. IMPRESSION: Intraoperative fluoroscopic assistance provided during external fixation of highly comminuted proximal tibial and fibular fractures Electronically Signed   By: Donavan Foil M.D.   On: 12/22/2019 23:14   DG Ankle Complete Right  Result Date: 12/22/2019 CLINICAL DATA:  Fall from ladder. EXAM: RIGHT ANKLE - COMPLETE 3+ VIEW COMPARISON:  None. FINDINGS: Comminuted fracture involving the calcaneus with mild displacement along the plantar aspect. The fracture extends to the subtalar joint. In addition, the calcaneal fracture involves the anterior process of the calcaneus. The right ankle appears to be located but there is comminuted fracture involving the distal fibula with displaced lateral fragment or  fragments. Extensive soft tissue swelling involving the lower leg and ankle region. No gross abnormality to the talar dome. Medial malleolus is intact. IMPRESSION: 1. Comminuted fractures involving the calcaneus and the distal fibula. The fractures could be better characterized with CT to exclude additional injuries. 2. Soft tissue swelling in the lower leg and ankle. Electronically Signed   By: Markus Daft M.D.   On: 12/22/2019 14:23   CT Head Wo Contrast  Result Date: 12/22/2019 CLINICAL DATA:  Patient fell from 12 foot ladder. Patient fell off, landing on her feet. Swelling in the MEDIAL RIGHT ankle and below LEFT knee. EXAM: CT HEAD WITHOUT CONTRAST CT CERVICAL SPINE WITHOUT CONTRAST TECHNIQUE: Multidetector CT imaging of the head and cervical spine was performed following the standard protocol without intravenous contrast. Multiplanar CT image reconstructions of the cervical spine were also generated. COMPARISON:  MR cervical spine 01/19/2015, CT head on 06/16/2018 FINDINGS: CT HEAD FINDINGS Brain: Stable, diffuse cerebellar atrophy. Focal low-attenuation within the RIGHT frontal lobe is consistent with chronic ischemic change. There is no intra or extra-axial fluid collection or mass lesion. The basilar cisterns and ventricles have a normal appearance. There is no CT evidence for acute infarction or hemorrhage. Vascular: No hyperdense vessel or unexpected calcification. Skull: Normal. Negative for fracture or focal lesion. Sinuses/Orbits: No acute finding. Other: None CT CERVICAL SPINE FINDINGS Alignment: Normal. Skull base and vertebrae: No acute fracture. No primary bone lesion or focal pathologic process. Soft tissues and spinal canal: No prevertebral fluid or swelling. No visible canal hematoma. Disc levels: There is disc height loss associated with uncovertebral spurring primarily at C6-7. Upper chest: There is a fracture of the proximal RIGHT first rib. Other: None IMPRESSION: 1. No evidence for acute  intracranial abnormality. 2. Stable, diffuse cerebellar atrophy. 3. Chronic RIGHT frontal lobe infarct. 4. No evidence for acute cervical spine abnormality. 5. Fracture of the proximal RIGHT first rib. These results were called by telephone at the time of interpretation on 12/22/2019 at 4:26 pm to provider [facility] , who verbally acknowledged these results. Electronically Signed   By: Nolon Nations M.D.   On: 12/22/2019 16:27   CT Chest W Contrast  Result Date: 12/22/2019 CLINICAL DATA:  [place] from 12 foot ladder, right ankle fracture, left knee fracture EXAM: CT CHEST, ABDOMEN, AND PELVIS WITH CONTRAST TECHNIQUE: Multidetector CT imaging of the chest, abdomen and pelvis was performed following the standard protocol during bolus administration of intravenous contrast. CONTRAST:  177mL OMNIPAQUE IOHEXOL 300 MG/ML  SOLN COMPARISON:  None. FINDINGS: CT CHEST FINDINGS Cardiovascular: Heart and great vessels are unremarkable with no evidence of vascular injury. No pericardial effusion. Mediastinum/Nodes: No enlarged mediastinal, hilar, or axillary lymph nodes. Thyroid gland, trachea, and esophagus demonstrate no significant findings. Lungs/Pleura: No airspace disease, effusion, or pneumothorax. Central airways are patent. Musculoskeletal: A  minimally displaced posterior right first rib fracture is better seen on the corresponding CT cervical spine evaluation. There are no other acute displaced fractures. Reconstructed images demonstrate no additional findings. CT ABDOMEN PELVIS FINDINGS Hepatobiliary: No hepatic injury or perihepatic hematoma. The gallbladder is surgically absent. Pancreas: Unremarkable. No pancreatic ductal dilatation or surrounding inflammatory changes. Spleen: No splenic injury or perisplenic hematoma. Adrenals/Urinary Tract: No adrenal hemorrhage or renal injury identified. Bladder is unremarkable. Stomach/Bowel: No bowel obstruction or ileus. No bowel wall thickening or inflammatory  change. Vascular/Lymphatic: No significant vascular findings are present. No enlarged abdominal or pelvic lymph nodes. Reproductive: 2.2 cm hypodensity ventral lower uterine segment consistent with fibroid. Otherwise the uterus and adnexal structures are age-appropriate. Other: No abdominal wall hernia or abnormality. No abdominopelvic ascites. Musculoskeletal: No acute displaced fractures. Reconstructed images demonstrate no additional findings. IMPRESSION: 1. Minimally displaced posterior right first rib fracture better seen on the corresponding CT cervical spine evaluation. 2. Otherwise no acute intrathoracic, intra-abdominal or intrapelvic injury. Electronically Signed   By: Randa Ngo M.D.   On: 12/22/2019 17:59   CT Cervical Spine Wo Contrast  Result Date: 12/22/2019 CLINICAL DATA:  Patient fell from 12 foot ladder. Patient fell off, landing on her feet. Swelling in the MEDIAL RIGHT ankle and below LEFT knee. EXAM: CT HEAD WITHOUT CONTRAST CT CERVICAL SPINE WITHOUT CONTRAST TECHNIQUE: Multidetector CT imaging of the head and cervical spine was performed following the standard protocol without intravenous contrast. Multiplanar CT image reconstructions of the cervical spine were also generated. COMPARISON:  MR cervical spine 01/19/2015, CT head on 06/16/2018 FINDINGS: CT HEAD FINDINGS Brain: Stable, diffuse cerebellar atrophy. Focal low-attenuation within the RIGHT frontal lobe is consistent with chronic ischemic change. There is no intra or extra-axial fluid collection or mass lesion. The basilar cisterns and ventricles have a normal appearance. There is no CT evidence for acute infarction or hemorrhage. Vascular: No hyperdense vessel or unexpected calcification. Skull: Normal. Negative for fracture or focal lesion. Sinuses/Orbits: No acute finding. Other: None CT CERVICAL SPINE FINDINGS Alignment: Normal. Skull base and vertebrae: No acute fracture. No primary bone lesion or focal pathologic process.  Soft tissues and spinal canal: No prevertebral fluid or swelling. No visible canal hematoma. Disc levels: There is disc height loss associated with uncovertebral spurring primarily at C6-7. Upper chest: There is a fracture of the proximal RIGHT first rib. Other: None IMPRESSION: 1. No evidence for acute intracranial abnormality. 2. Stable, diffuse cerebellar atrophy. 3. Chronic RIGHT frontal lobe infarct. 4. No evidence for acute cervical spine abnormality. 5. Fracture of the proximal RIGHT first rib. These results were called by telephone at the time of interpretation on 12/22/2019 at 4:26 pm to provider [facility] , who verbally acknowledged these results. Electronically Signed   By: Nolon Nations M.D.   On: 12/22/2019 16:27   CT Lumbar Spine Wo Contrast  Result Date: 12/22/2019 CLINICAL DATA:  Low back pain status post trauma. Fall from 12 foot ladder. EXAM: CT LUMBAR SPINE WITHOUT CONTRAST TECHNIQUE: Multidetector CT imaging of the lumbar spine was performed without intravenous contrast administration. Multiplanar CT image reconstructions were also generated. COMPARISON:  None. FINDINGS: Segmentation: 5 lumbar type vertebrae. Alignment: Normal. Vertebrae: No acute fracture or focal pathologic process. Paraspinal and other soft tissues: The patient is status post prior cholecystectomy. The visualized portions of the kidneys are unremarkable. There is no evidence for an abdominal aortic aneurysm. Disc levels: The disc heights are relatively well preserved throughout the lumbar spine. IMPRESSION: No acute osseous  abnormality of the lumbar spine. Electronically Signed   By: Constance Holster M.D.   On: 12/22/2019 16:19   CT KNEE LEFT WO CONTRAST  Result Date: 12/23/2019 CLINICAL DATA:  Comminuted tibial plateau fractures status post external fixation. EXAM: CT OF THE left KNEE WITHOUT CONTRAST TECHNIQUE: Multidetector CT imaging of the left knee was performed according to the standard protocol.  Multiplanar CT image reconstructions were also generated. COMPARISON:  CT scan from yesterday. FINDINGS: Interval external fixator placement with pins in the mid femoral shaft and mid tibial shaft. Improved impaction and fracture displacement with external fixation. The fractures are out to length. Stable appearance of the proximal fibular fractures. Stable large lipohemarthrosis. IMPRESSION: 1. Interval external fixator placement with pins in the mid femoral shaft and mid tibial shaft. Improved impaction and fracture displacement with external fixation. 2. Stable appearance of the proximal fibular fractures. 3. Stable large lipohemarthrosis. Electronically Signed   By: Marijo Sanes M.D.   On: 12/23/2019 08:14   CT KNEE LEFT WO CONTRAST  Result Date: 12/22/2019 CLINICAL DATA:  [place] from a ladder. EXAM: CT OF THE left KNEE WITHOUT CONTRAST TECHNIQUE: Multidetector CT imaging of the left knee was performed according to the standard protocol. Multiplanar CT image reconstructions were also generated. COMPARISON:  Radiographs, same date. FINDINGS: Complex comminuted fractures of the tibial plateau. There is a large inverted Y shaped intra-articular fracture through the mid portion of the lateral tibial plateau splitting the plateau in half and then both arms extend out through the medial and lateral cortices near the metadiaphyseal junction. Maximum depression of the lateral plateau fracture is 6.4 mm. The tibial spines are severely comminuted with numerous small fracture fragments. The medial tibial plateau fracture is an oblique coursing fracture through the anterior third of the plateau and extending posteriorly out through the posterior cortex. Complex comminuted fracture of the proximal fibula including the head neck and proximal shaft. No significant displacement. The femur is intact. No patellar fracture. Large lipohemarthrosis. Grossly by CT the cruciate and collateral ligaments are intact. But exam is  limited. The quadriceps and patellar tendons are intact. IMPRESSION: 1. Complex comminuted fractures of the tibial plateau as described above. 2. Complex comminuted fracture of the proximal fibula. 3. Large lipohemarthrosis. 4. Grossly by CT the cruciate and collateral ligaments are intact. But exam is limited. Electronically Signed   By: Marijo Sanes M.D.   On: 12/22/2019 16:32   CT ABDOMEN PELVIS W CONTRAST  Result Date: 12/22/2019 CLINICAL DATA:  [place] from 12 foot ladder, right ankle fracture, left knee fracture EXAM: CT CHEST, ABDOMEN, AND PELVIS WITH CONTRAST TECHNIQUE: Multidetector CT imaging of the chest, abdomen and pelvis was performed following the standard protocol during bolus administration of intravenous contrast. CONTRAST:  125mL OMNIPAQUE IOHEXOL 300 MG/ML  SOLN COMPARISON:  None. FINDINGS: CT CHEST FINDINGS Cardiovascular: Heart and great vessels are unremarkable with no evidence of vascular injury. No pericardial effusion. Mediastinum/Nodes: No enlarged mediastinal, hilar, or axillary lymph nodes. Thyroid gland, trachea, and esophagus demonstrate no significant findings. Lungs/Pleura: No airspace disease, effusion, or pneumothorax. Central airways are patent. Musculoskeletal: A minimally displaced posterior right first rib fracture is better seen on the corresponding CT cervical spine evaluation. There are no other acute displaced fractures. Reconstructed images demonstrate no additional findings. CT ABDOMEN PELVIS FINDINGS Hepatobiliary: No hepatic injury or perihepatic hematoma. The gallbladder is surgically absent. Pancreas: Unremarkable. No pancreatic ductal dilatation or surrounding inflammatory changes. Spleen: No splenic injury or perisplenic hematoma. Adrenals/Urinary Tract: No adrenal  hemorrhage or renal injury identified. Bladder is unremarkable. Stomach/Bowel: No bowel obstruction or ileus. No bowel wall thickening or inflammatory change. Vascular/Lymphatic: No significant vascular  findings are present. No enlarged abdominal or pelvic lymph nodes. Reproductive: 2.2 cm hypodensity ventral lower uterine segment consistent with fibroid. Otherwise the uterus and adnexal structures are age-appropriate. Other: No abdominal wall hernia or abnormality. No abdominopelvic ascites. Musculoskeletal: No acute displaced fractures. Reconstructed images demonstrate no additional findings. IMPRESSION: 1. Minimally displaced posterior right first rib fracture better seen on the corresponding CT cervical spine evaluation. 2. Otherwise no acute intrathoracic, intra-abdominal or intrapelvic injury. Electronically Signed   By: Randa Ngo M.D.   On: 12/22/2019 17:59   CT Ankle Right Wo Contrast  Result Date: 12/22/2019 CLINICAL DATA:  [place] from a ladder. EXAM: CT OF THE RIGHT ANKLE AND RIGHT FOOT WITHOUT CONTRAST TECHNIQUE: Multidetector CT imaging of the right ankle was performed according to the standard protocol. Multiplanar CT image reconstructions were also generated. COMPARISON:  Radiographs, same date. FINDINGS: Complex comminuted central depression type fracture of the calcaneus with multiple fracture lines. The posterior facet is comminuted and impacted and slightly rotated dorsally. Longitudinal/vertical fracture through the base of the sustentacular limb with approximately 3 mm of displacement. The anterior process of the calcaneus is intact. The tibiotalar joint is maintained. No fracture of the tibia or talus is identified. The talonavicular joint is maintained. No navicular fracture. The tarsal and metatarsal bones are intact. Mildly displaced longitudinal fracture through the lateral cortex of the distal fibula. The tibiotalar joint is maintained. IMPRESSION: 1. Complex comminuted central depression type fracture of the calcaneus with multiple fracture lines. 2. Longitudinal/vertical fracture through the base of the sustentaculum with approximately 3 mm of displacement. 3. Mildly displaced  longitudinal fracture through the lateral cortex of the distal fibula. Electronically Signed   By: Marijo Sanes M.D.   On: 12/22/2019 16:26   CT FOOT RIGHT WO CONTRAST  Result Date: 12/22/2019 CLINICAL DATA:  [place] from a ladder. EXAM: CT OF THE RIGHT ANKLE AND RIGHT FOOT WITHOUT CONTRAST TECHNIQUE: Multidetector CT imaging of the right ankle was performed according to the standard protocol. Multiplanar CT image reconstructions were also generated. COMPARISON:  Radiographs, same date. FINDINGS: Complex comminuted central depression type fracture of the calcaneus with multiple fracture lines. The posterior facet is comminuted and impacted and slightly rotated dorsally. Longitudinal/vertical fracture through the base of the sustentacular limb with approximately 3 mm of displacement. The anterior process of the calcaneus is intact. The tibiotalar joint is maintained. No fracture of the tibia or talus is identified. The talonavicular joint is maintained. No navicular fracture. The tarsal and metatarsal bones are intact. Mildly displaced longitudinal fracture through the lateral cortex of the distal fibula. The tibiotalar joint is maintained. IMPRESSION: 1. Complex comminuted central depression type fracture of the calcaneus with multiple fracture lines. 2. Longitudinal/vertical fracture through the base of the sustentaculum with approximately 3 mm of displacement. 3. Mildly displaced longitudinal fracture through the lateral cortex of the distal fibula. Electronically Signed   By: Marijo Sanes M.D.   On: 12/22/2019 16:26   DG Knee Complete 4 Views Left  Result Date: 12/22/2019 CLINICAL DATA:  Fall from a 12 foot ladder. Multiple injuries. Left knee pain. EXAM: LEFT KNEE - COMPLETE 4+ VIEW COMPARISON:  None. FINDINGS: There are comminuted fractures of the proximal tibia as well as the proximal fibula. Fracture of the tibia extends transversely across the metaphysis with additional fractures extending to the medial  articular margin of the lateral tibial plateau. Fractures show mild displacement, largest lateral component displaced approximately 1.5 cm laterally. There is also a comminuted fracture of the proximal fibula centered on the metaphysis, with no significant displacement. Knee joint is normally aligned. There is mild depression of the lateral tibial plateau articular surface. No femur fracture. There is a joint effusion with a fat fluid level. Surrounding soft tissue swelling is noted of the proximal leg. IMPRESSION: 1. Comminuted, mildly displaced fractures of the proximal left tibia intersecting the lateral tibial plateau articular surface. Comminuted nondisplaced fractures of the proximal fibula. No dislocation. Electronically Signed   By: Lajean Manes M.D.   On: 12/22/2019 14:20   DG Knee Complete 4 Views Right  Result Date: 12/22/2019 CLINICAL DATA:  [place] from a 12 foot ladder.  Right knee pain. EXAM: RIGHT KNEE - COMPLETE 4+ VIEW COMPARISON:  None. FINDINGS: Nondisplaced oblique fracture of the proximal fibular shaft. No other fractures. Knee joint is normally spaced and aligned. Probable small joint effusion. Soft tissues otherwise unremarkable. IMPRESSION: Nondisplaced, nonangulated fracture of the proximal right fibular shaft. No other fractures. Knee joint normally aligned. Electronically Signed   By: Lajean Manes M.D.   On: 12/22/2019 14:21   DG C-Arm 1-60 Min  Result Date: 12/22/2019 CLINICAL DATA:  Knee fracture EXAM: LEFT KNEE - 1-2 VIEW; DG C-ARM 1-60 MIN COMPARISON:  CT 12/22/2019 FINDINGS: Six low resolution intraoperative spot views of the left knee. Total fluoroscopy time was 34 seconds. None highly comminuted proximal tibial and fibular fractures. Placement of external fixating device. IMPRESSION: Intraoperative fluoroscopic assistance provided during external fixation of highly comminuted proximal tibial and fibular fractures Electronically Signed   By: Donavan Foil M.D.   On: 12/22/2019  23:14    Anti-infectives: Anti-infectives (From admission, onward)   Start     Dose/Rate Route Frequency Ordered Stop   12/23/19 0400  ceFAZolin (ANCEF) IVPB 2g/100 mL premix     2 g 200 mL/hr over 30 Minutes Intravenous Every 6 hours 12/23/19 0019 12/23/19 2159       Assessment/Plan Fall off of ladder Right comminuted ankle fracture Left tibial plateau fracture Left fibula fracture Right fibula fracture History of partial seizure disorder Chronic back pain  - above per primary team -   Right first rib fracture - repeat CXR, IS and pulm toilet, pain control  No other recommendations from a trauma standpoint. If repeat CXR stable, no further imaging needed for single rib fracture. We will sign off. Follow up with PCP prn.   LOS: 1 day    Norm Parcel , [facility] 12/23/2019, 9:21 AM Please see Amion for pager number during day hours 7:00am-4:30pm

## 2019-12-23 NOTE — Progress Notes (Signed)
Orthopedic Tech Progress Note Patient Details:  Cindy Mcclure May 07, 1966 [id number] Applied short leg splint to patient leg on top of COMPRESSION  Per MD before XRAY [facility] came in.Manson Passey Devices Type of Ortho Device: Short leg splint Ortho Device/Splint Location: RLE Ortho Device/Splint Interventions: Ordered, Application   Post Interventions Patient Tolerated: Well Instructions Provided: Care of [place] 12/23/2019, 9:34 AM

## 2019-12-23 NOTE — Plan of Care (Signed)

## 2019-12-23 NOTE — Consult Note (Signed)
Orthopaedic Trauma Service (OTS) Consult   Patient ID: Cindy Mcclure MRN: [id number] DOB/AGE: 02/12/1966 54 y.o.  Reason for Consult:Left tibial plateau fracture/Right calcaneus fracture Referring Physician: Dr. Eduard Roux, MD [facility]  HPI: Cindy Mcclure is an 54 y.o. female who is being seen in consultation at the request of Dr. Erlinda Hong for evaluation of left tibial plateau fracture and right calcaneus/ankle fracture.  The patient was on a ladder yesterday about 8 feet up when she fell landing on her bilateral lower extremities.  She presented to the emergency room where x-rays showed a significantly displaced bicondylar tibial plateau fracture on the left and a right intra-articular calcaneus fracture with distal fibula fracture on the right.  She underwent closed reduction external fixation by Dr. Erlinda Hong yesterday evening.  Due to the complexity of her fractures he felt that it would best be served by treatment by an orthopedic traumatologist.  Patient was seen and evaluated on 5 N.  Currently comfortable.  She complains of significant pain in her left leg and less amount of pain in her right ankle and foot.  She states that she has no pain in her arms but she did have an IV that was infiltrated in her left antecubital fossa.  She is able to raise her arms over her head.  The pain worsens with movement.  It improves with pain medication.  She denies any numbness or tingling.  The patient is on disability for seizure disorder for which she takes her medication daily.  She has not had a seizure in over a few months.  She lives at home with her daughter on a one-story home.  She has had multiple orthopedic injuries and surgeries with knee arthroscopies and a left rotator cuff repair.  See ambulates independently.  She denies tobacco use.  She says that she quit multiple years ago.  She does not drink.  He is.  She does have some chest pain for which she has been worked up with possible and angina.  Past  Medical History:  Diagnosis Date  . Allergic rhinitis   . Anginal pain [facility])    sees dr Clayborn Bigness  . Anxiety   . Arthritis    all over...knees, ankles, back  . Back pain   . Cervical radiculitis    no surgery, just cortisone shots.    . Constipation   . Constipation   . DDD (degenerative disc disease), cervical   . Dermatophytosis of nail 2019   both feet  . GERD (gastroesophageal reflux disease)   . History of kidney stones    h/o  . Osteoarthritis   . Panic attacks   . Plantar fasciitis    bilaterally  . Seizures ([place]) 1972   unsure of when she had last seizure due to petit mal type. does not know what brings on a seizure.  . Stress bladder incontinence, female   . Urinary incontinence     Past Surgical History:  Procedure Laterality Date  . CESAREAN SECTION  [id number]  . CHOLECYSTECTOMY N/A 12/06/2015   Procedure: LAPAROSCOPIC CHOLECYSTECTOMY WITH INTRAOPERATIVE CHOLANGIOGRAM;  Surgeon: Dia Crawford III, MD;  Location: [facility] ORS;  Service: General;  Laterality: N/A;  . COLONOSCOPY WITH PROPOFOL N/A 09/29/2016   Procedure: COLONOSCOPY WITH PROPOFOL;  Surgeon: Manya Silvas, MD;  Location: [facility] ENDOSCOPY;  Service: Endoscopy;  Laterality: N/A;  . EXTERNAL FIXATION LEG Left 12/22/2019   Procedure: External fixation left spanning knee. CPT 20690 uniplane;  Surgeon: Leandrew Koyanagi, MD;  Location: [facility];  Service: Orthopedics;  Laterality: Left;  . KNEE ARTHROSCOPY WITH EXCISION PLICA Left [id number]   Procedure: KNEE ARTHROSCOPY WITH EXCISION PLICA, PARTIAL SYNOVECTOMY;  Surgeon: Hessie Knows, MD;  Location: [facility] ORS;  Service: Orthopedics;  Laterality: Left;  . KNEE ARTHROSCOPY WITH MEDIAL MENISECTOMY Right 09/03/2017   Procedure: KNEE ARTHROSCOPY WITH MEDIAL MENISECTOMY, PARTIAL SYNOVECTOMY;  Surgeon: Hessie Knows, MD;  Location: [facility] ORS;  Service: Orthopedics;  Laterality: Right;  . SHOULDER ARTHROSCOPY WITH DEBRIDEMENT AND BICEP TENDON REPAIR Left 03/22/2019   Procedure: SHOULDER  ARTHROSCOPY WITH DEBRIDEMENT, ROTATOR CUFF REPAIR, AND BICEP TENDON REPAIR;  Surgeon: Corky Mull, MD;  Location: [facility] ORS;  Service: Orthopedics;  Laterality: Left;  . SUBACROMIAL DECOMPRESSION  03/22/2019   Procedure: SUBACROMIAL DECOMPRESSION;  Surgeon: Corky Mull, MD;  Location: [facility] ORS;  Service: Orthopedics;;  . SURGERY FOR SEIZURES     nothing implanted in head. she was 54 years old. done at [facility]  . TUBAL LIGATION  2003    Family History  Problem Relation Age of Onset  . Anxiety disorder Daughter     Social History:  reports that she quit smoking about 7 years ago. Her smoking use included cigarettes. She smoked 0.25 packs per day. She has never used smokeless tobacco. She reports current alcohol use. She reports that she does not use drugs.  Allergies: No Known Allergies  Medications:  No current facility-administered medications on file prior to encounter.   Current Outpatient Medications on File Prior to Encounter  Medication Sig Dispense Refill  . ascorbic acid (VITAMIN C) 1000 MG tablet Take 1,000 mg by mouth daily.     . Calcium Carb-Cholecalciferol (CALCIUM 600 + D PO) Take 1 tablet by mouth daily.    . cloBAZam (ONFI) 20 MG tablet Take 20 mg by mouth at bedtime.     . diclofenac (VOLTAREN) 75 MG EC tablet Take 75 mg by mouth 2 (two) times daily.    . Flaxseed, Linseed, (FLAXSEED OIL) 1000 MG CAPS Take 1,000 mg by mouth daily.    Marland Kitchen lamoTRIgine (LAMICTAL) 150 MG tablet Take 150 mg by mouth 2 (two) times daily.     . Levetiracetam (KEPPRA XR) 750 MG TB24 Take 750 mg by mouth 2 (two) times daily.    . Multiple Vitamins-Minerals (CENTRUM ADULTS PO) Take 1 tablet by mouth daily.     Marland Kitchen omeprazole (PRILOSEC) 20 MG capsule Take 20 mg by mouth every morning.     . pyridOXINE (VITAMIN B-6) 100 MG tablet Take 100 mg by mouth daily.     . sertraline (ZOLOFT) 100 MG tablet Take 1 tablet (100 mg total) by mouth daily. (Patient taking differently: Take 100 mg by mouth at bedtime. )  30 tablet 1  . traMADol (ULTRAM) 50 MG tablet Take 50 mg by mouth daily.     . Vitamin A 3 MG (10000 UT) TABS Take 3,000 Units by mouth daily.     . vitamin B-12 (CYANOCOBALAMIN) 500 MCG tablet Take 500 mcg by mouth daily.     . vitamin C (ASCORBIC ACID) 500 MG tablet Take 500 mg by mouth daily.    . Vitamin E 200 units TABS Take 200 Units by mouth daily.     Marland Kitchen zonisamide (ZONEGRAN) 100 MG capsule Take 400 mg by mouth daily with supper.      ROS: Constitutional: No fever or chills Vision: No changes in vision ENT: No difficulty swallowing CV: No chest pain Pulm: No SOB or wheezing  GI: No nausea or vomiting GU: No urgency or inability to hold urine Skin: No poor wound healing Neurologic: No numbness or tingling Psychiatric: No depression or anxiety Heme: No bruising Allergic: No reaction to medications or food   Exam: Blood pressure 103/71, pulse 93, temperature (!) 97.5 F (36.4 C), temperature source Oral, resp. rate 17, height 5' (1.524 m), weight 77.1 kg, SpO2 96 %. General: No acute distress Orientation: She is awake alert and oriented x3 Mood and Affect: Patient is cooperative with flat affect.  She appears to be drowsy during exam Gait: Unable to assess due to her fractures Coordination and balance: Within normal limits  Injured Extremity (CV, lymph, sensation, reflexes): Left lower extremity: Exfix is in place.  The pin sites are clean dry and intact.  The Ace wrap is clean dry and intact.  She does have notable swelling circumferentially.  Compartments are soft and compressible.  Although she does not have any active skin wrinkling, skin appears to be mobile.  Her foot is held in a plantarflexed position.  She has intact dorsiflexion plantarflexion albeit weakness secondary to pain.  She is sensation intact to the dorsum and plantar aspect of her foot.  She has a 2+ DP pulse.  She is not able to tolerate any instability or range of motion of her lower extremity.  Her reflexes  are within normal limits.  No lymphadenopathy.  Right lower extremity: Ace wrap and compressive wrap are in place.  No splint is in place.  She has bruising and ecchymosis about her foot.  She does not have skin wrinkling but her skin does appear to be somewhat mobile.  No active blistering.  She has intact dorsiflexion plantarflexion of her toes and ankle although it is limited secondary to pain.  She has no pain with range of motion of her knee or hip.  Although this is limited secondary to her foot and ankle.  She has no instability noted throughout her right lower extremity.  She is warm well-perfused foot.  Left upper extremity: Reveals some bruising and swelling in her antecubital fossa consistent with her IV infiltrate.  No tenderness otherwise throughout the shoulder elbow or wrist.  She has full painless range of motion.  Full strength in each muscle groups.  No evidence of instability.  Right upper extremity: Skin without lesions. No tenderness to palpation. Full painless ROM, full strength in each muscle groups without evidence of instability.   Medical Decision Making: Data: Imaging: X-rays and CT scan of the left knee show a complex bicondylar tibial plateau fracture with significant involvement of the medial lateral condyle.  It also appears that her tibial tubercle may be a free fragment.  CT imaging post ex fix shows maintained alignment.  No signs of subluxation or dislocation.  X-rays and CT scan of the right ankle and right foot are reviewed which shows a primarily [place] 2 calcaneus fracture with significant involvement of the middle facet and sustentaculum tali.  It also extends into the calcaneocuboid joint.  She also has a fibular avulsion fracture likely from the peroneal tubercle consistent with likely instability of the peroneal tendons.  Labs:  Results for orders placed or performed during the hospital encounter of 12/22/19 (from the past 24 hour(s))  CBC     Status:  Abnormal   Collection Time: 12/22/19  2:36 PM  Result Value Ref Range   WBC 18.1 (H) 4.0 - 10.5 K/uL   RBC 3.87 3.87 - 5.11 MIL/uL  Hemoglobin 11.9 (L) 12.0 - 15.0 g/dL   HCT 37.4 36.0 - 46.0 %   MCV 96.6 80.0 - 100.0 fL   MCH 30.7 26.0 - 34.0 pg   MCHC 31.8 30.0 - 36.0 g/dL   RDW 13.3 11.5 - 15.5 %   Platelets 375 150 - 400 K/uL   nRBC 0.0 0.0 - 0.2 %  Comprehensive metabolic panel     Status: Abnormal   Collection Time: 12/22/19  2:36 PM  Result Value Ref Range   Sodium 138 135 - 145 mmol/L   Potassium 3.8 3.5 - 5.1 mmol/L   Chloride 108 98 - 111 mmol/L   CO2 23 22 - 32 mmol/L   Glucose, Bld 136 (H) 70 - 99 mg/dL   BUN 20 6 - 20 mg/dL   Creatinine, Ser 1.17 (H) 0.44 - 1.00 mg/dL   Calcium 8.4 (L) 8.9 - 10.3 mg/dL   Total Protein 6.7 6.5 - 8.1 g/dL   Albumin 3.7 3.5 - 5.0 g/dL   AST 27 15 - 41 U/L   ALT 20 0 - 44 U/L   Alkaline Phosphatase 96 38 - 126 U/L   Total Bilirubin 0.5 0.3 - 1.2 mg/dL   GFR calc non Af Amer 52 (L) >60 mL/min   GFR calc Af Amer >60 >60 mL/min   Anion gap 7 5 - 15  HIV Antibody (routine testing w rflx)     Status: None   Collection Time: 12/22/19  4:16 PM  Result Value Ref Range   HIV Screen 4th Generation wRfx Non Reactive Non Reactive  Respiratory Panel by RT PCR (Flu A&B, Covid) - Nasopharyngeal Swab     Status: None   Collection Time: 12/22/19  4:50 PM   Specimen: Nasopharyngeal Swab  Result Value Ref Range   SARS Coronavirus 2 by RT PCR NEGATIVE NEGATIVE   Influenza A by PCR NEGATIVE NEGATIVE   Influenza B by PCR NEGATIVE NEGATIVE  CBC     Status: Abnormal   Collection Time: 12/23/19  3:45 AM  Result Value Ref Range   WBC 15.5 (H) 4.0 - 10.5 K/uL   RBC 3.71 (L) 3.87 - 5.11 MIL/uL   Hemoglobin 11.5 (L) 12.0 - 15.0 g/dL   HCT 36.2 36.0 - 46.0 %   MCV 97.6 80.0 - 100.0 fL   MCH 31.0 26.0 - 34.0 pg   MCHC 31.8 30.0 - 36.0 g/dL   RDW 13.7 11.5 - 15.5 %   Platelets 328 150 - 400 K/uL   nRBC 0.0 0.0 - 0.2 %  Creatinine, serum     Status:  Abnormal   Collection Time: 12/23/19  3:45 AM  Result Value Ref Range   Creatinine, Ser 1.04 (H) 0.44 - 1.00 mg/dL   GFR calc non Af Amer >60 >60 mL/min   GFR calc Af Amer >60 >60 mL/min    Imaging or Labs ordered: No new x-rays ordered  Medical history and chart was reviewed and case discussed with medical provider.  Assessment/Plan: 54 year old female with a history of seizure disorder status post fall from ladder with a left bicondylar tibial plateau fracture and a right [place] 2 calcaneus fracture with fibular avulsion.  Patient has a significant injuries that will require definitive fixation for both.  Unfortunately with the constellation of injuries she will be nonweightbearing to her bilateral lower extremities for approximately 6 to 8 weeks.  At this point she is too swollen to proceed with definitive fixation of either her tibial plateau or calcaneus.  However feel that with a good elevation and compression that over the weekend she should be ready for definitive fixation early in the week.  I discussed risks and benefits for the surgery with the patient.  Risks included but not limited to bleeding, infection, malunion, nonunion, posttraumatic arthritis, knee stiffness, ankle stiffness, nerve and blood vessel injury, DVT, even the possibility of anesthetic complications.  The patient agreed to proceed with surgery.  We will tentatively plan to put the patient on the surgery schedule for Monday.  I feel that she will likely need skilled nursing facility postoperative.  Shona Needles, MD [facility] [phone] (office) [email or web address]

## 2019-12-24 NOTE — Plan of Care (Signed)

## 2019-12-24 NOTE — Progress Notes (Addendum)
OT NOTE / Splint check  OT NOTE  RN STAFF  Please check splint every 4 hours during shift ( remove splint , remove stockinette/ dressing present) to assess for: * pain * redness *swelling  If any symptoms above present remove splint for 15 minutes. If symptoms continue - keep the splint removed and notify OT staff [phone] immediately.   Keep the L LE elevated at all times on pillows / towels.  Splint can be cleaned with warm soapy water and alcohol swab. Splint should not be placed in heat of any kind because the splint with mold into a new shape.   Pt is able to demonstrate ankle flexion and extension at EOB. Pt educated on ankle pumps and toe movement. Pt completed 10 reps this session. Pt with total (A) to wash foot and re-don splint. Pt and mother educated on wear schedule. Pt with flat affect throughout splint check.   Message to Rn to request additional splint check this PM and by night shift RN. (please leave contact note for OT staff of any concern areas)  OT will check patient again in the AM.    Fleeta Emmer, OTR/L  Acute Rehabilitation Services Pager: [phone] Office: [phone] .

## 2019-12-24 NOTE — Evaluation (Signed)
Occupational Therapy Evaluation Patient Details Name: Cindy Mcclure MRN: [id number] DOB: [birth year] Today's Date: 12/24/2019    History of Present Illness 54 yo female s/p fall from ladder with ORIF L bicondylar tibial plateau fx, L fibial fx R rib fx  and R  fibula fx PMH chronic back pain, DDD, partial seizure, panic attacks   Clinical Impression   Patient is s/p ORIF L bicondylar tibial plateau external fixation surgery resulting in functional limitations due to the deficits listed below (see OT problem list). OT order for splinting only at this time. Recommend splint don 4 hours off 2 hours to build up tolerance. Pt okay to sleep in splint. Splint clearly labeled top and bottom to (A) staff/ patient with correct application. Pt with noted drainage from L LE from incision types. Pt sleeping soundly at the end of this session so OT to return for further education of don doff. Rn notified.  Patient will benefit from skilled OT acutely to increase independence and safety with ADLS to allow discharge follow MD recommendation.     Follow Up Recommendations  Follow surgeon's recommendation for DC plan and follow-up therapies    Equipment Recommendations  3 in 1 bedside commode(drop arm likely if d/c is home---)    Recommendations for Other Services PT consult     Precautions / Restrictions Precautions Precautions: Fall Restrictions Weight Bearing Restrictions: Yes RLE Weight Bearing: Non weight bearing LLE Weight Bearing: Non weight bearing      Mobility   Balance    ADL either performed or assessed with clinical judgement   ADL Overall ADL's : Needs assistance/impaired Eating/Feeding: Independent   Grooming: Wash/dry face;Wash/dry hands;Bed level;Independent                                       Vision Baseline Vision/History: Wears glasses Wears Glasses: At all times Vision Assessment?: No apparent visual deficits     Perception     Praxis       Pertinent Vitals/Pain Pain Assessment: 0-10 Pain Score: 5  Pain Location: L leg Pain Descriptors / Indicators: Tightness;Tingling(feet feel numb and tingling) Pain Intervention(s): Monitored during session     Hand Dominance Right   Extremity/Trunk Assessment Upper Extremity Assessment Upper Extremity Assessment: RUE deficits/detail;LUE deficits/detail RUE Deficits / Details: WFL LUE Deficits / Details: reports pain at times but AROM WFL       Cervical / Trunk Assessment Cervical / Trunk Assessment: Other exceptions(chronic back pain)   Communication Communication Communication: No difficulties(slow speech and delayed responses )   Cognition Arousal/Alertness: Awake/alert Behavior During Therapy: WFL for tasks assessed/performed Overall Cognitive Status: Within Functional Limits for tasks assessed                                 General Comments: pt pauses prior to responding to questions in a very slow delayed manner. No family present to determine if this is baseline   General Comments  edema noted in L LE with drainage from pin sites. RN notified of drainage    Exercises Exercises: Other exercises Other Exercises Other Exercises: arom toes encouraged during sesison  Other Exercises: pt falling asleep at this time so OT will return to further educate on don doff later today. Ot will complete skin check    Shoulder Instructions      Home Living  Family/patient expects to be discharged to:: Private residence Living Arrangements: Children(daughter ) Available Help at Discharge: Available PRN/intermittently Type of Home: House Home Access: Stairs to enter [employer] of Steps: 2   Home Layout: One level     Bathroom Shower/Tub: Teacher, early years/pre: Standard     Home Equipment: None   Additional Comments: daughter work schedule varies as to when she is off but will be working      Prior Functioning/Environment Level  of Independence: Independent                 OT Problem List: Decreased strength;Decreased range of motion;Decreased activity tolerance;Impaired balance (sitting and/or standing);Pain      OT Treatment/Interventions: Patient/family education;Splinting    OT Goals(Current goals can be found in the care plan section) Acute Rehab OT Goals Patient Stated Goal: to eat my breakfast first OT Goal Formulation: With patient Time For Goal Achievement: 01/07/20 Potential to Achieve Goals: Good  OT Frequency: Min 3X/week(splint management order only at the moment)   Barriers to D/C: Decreased caregiver support          Co-evaluation              AM-PAC OT "6 Clicks" Daily Activity     Outcome Measure Help from another person eating meals?: None Help from another person taking care of personal grooming?: None Help from another person toileting, which includes using toliet, bedpan, or urinal?: A Lot Help from another person bathing (including washing, rinsing, drying)?: A Lot Help from another person to put on and taking off regular upper body clothing?: A Little Help from another person to put on and taking off regular lower body clothing?: A Lot 6 Click Score: 17   End of Session Nurse Communication: Mobility status;Precautions;Weight bearing status  Activity Tolerance: Patient tolerated treatment well Patient left: in bed;with call bell/phone within reach;with bed alarm set  OT Visit Diagnosis: Unsteadiness on feet (R26.81);Muscle weakness (generalized) (M62.81);Pain Pain - Right/Left: Left Pain - part of body: Leg                Time: [id number] OT Time Calculation (min): 67 min Charges:  OT General Charges $OT Visit: 1 Visit OT Treatments $Orthotics Fit/Training: 53-67 mins   Brynn, OTR/L  Acute Rehabilitation Services Pager: [phone] Office: [phone] .   Jeri Modena 12/24/2019, 10:00 AM

## 2019-12-24 NOTE — Evaluation (Addendum)
Physical Therapy Evaluation Patient Details Name: Cindy Mcclure MRN: [id number] DOB: May 13, 1966 Today's Date: 12/24/2019   History of Present Illness  54 yo female s/p fall from ladder with ORIF L bicondylar tibial plateau fx, L fibial fx R rib fx  and R  fibula fx PMH chronic back pain, DDD, partial seizure, panic attacks  Clinical Impression  Prior to admission, pt is independent, and lives with her 54 y.o. daughter in a one level house with 2 steps to enter. Pt mother reports she can assist as needed. Upon PT evaluation, pt drowsy, with flat affect; difficult to engage but following all 1 step commands. Pt reporting fair pain control, majority of pain in LLE. She is overall able to move to edge of bed with min assist for LLE management. Performed pre transfer training of scooting laterally in bed using arms. Pt declining trialing transfer to chair today but agreeable to tomorrow. Based on current mobility, anticipate she will do well. Discussed two potential techniques of slide board versus anterior-posterior. Will continue to follow and reassess for d/c planning.    Follow Up Recommendations Home health PT;Supervision/Assistance - 24 hour (tentatively, may change)    Equipment Recommendations  Wheelchair (measurements PT);Wheelchair cushion (measurements PT);Other (comment)(drop arm bedside commode, slide board )    Recommendations for Other Services       Precautions / Restrictions Precautions Precautions: Fall;Other (comment) Precaution Comments: LLE ex fix Restrictions Weight Bearing Restrictions: Yes RLE Weight Bearing: Non weight bearing LLE Weight Bearing: Non weight bearing      Mobility  Bed Mobility Overal bed mobility: Needs Assistance Bed Mobility: Supine to Sit;Sit to Supine     Supine to sit: Min assist Sit to supine: Min assist   General bed mobility comments: MinA for LLE negotiation  Transfers                 General transfer comment: deferred by  pt  Ambulation/Gait                Stairs            Wheelchair Mobility    Modified Rankin (Stroke Patients Only)       Balance Overall balance assessment: Needs assistance Sitting-balance support: Feet unsupported Sitting balance-Leahy Scale: Good                                       Pertinent Vitals/Pain Pain Assessment: Faces Faces Pain Scale: Hurts little more Pain Location: L leg Pain Descriptors / Indicators: Tightness;Tingling(feet feel numb and tingling) Pain Intervention(s): Monitored during session    Home Living Family/patient expects to be discharged to:: Private residence Living Arrangements: Children(daughter ) Available Help at Discharge: Available PRN/intermittently Type of Home: House Home Access: Stairs to enter   Technical brewer of Steps: 2 Home Layout: One level Home Equipment: None Additional Comments: daughter work schedule varies as to when she is off but will be working    Prior Function Level of Independence: Independent               Hand Dominance   Dominant Hand: Right    Extremity/Trunk Assessment   Upper Extremity Assessment Upper Extremity Assessment: RUE deficits/detail;LUE deficits/detail RUE Deficits / Details: WFL LUE Deficits / Details: reports pain at times but AROM [facility]    Lower Extremity Assessment Lower Extremity Assessment: RLE deficits/detail;LLE deficits/detail RLE Deficits / Details: Able to perform  SLR, heel slide, wiggle toes LLE Deficits / Details: Ankle dorsiflexion/plantarflexion 3/5    Cervical / Trunk Assessment Cervical / Trunk Assessment: Other exceptions(chronic back pain)  Communication   Communication: No difficulties(slow speech and delayed responses )  Cognition Arousal/Alertness: Awake/alert Behavior During Therapy: WFL for tasks assessed/performed Overall Cognitive Status: Within Functional Limits for tasks assessed                                  General Comments: pt pauses prior to responding to questions in a very slow delayed manner. No family present to determine if this is baseline      General Comments      Exercises General Exercises - Lower Extremity Ankle Circles/Pumps: Left;10 reps;Seated Heel Slides: Right;10 reps;Supine Hip ABduction/ADduction: Right;10 reps;Supine Straight Leg Raises: Right;10 reps;Supine   Assessment/Plan    PT Assessment Patient needs continued PT services  PT Problem List Decreased strength;Decreased range of motion;Decreased activity tolerance;Decreased balance;Decreased mobility;Decreased cognition;Impaired sensation;Pain       PT Treatment Interventions DME instruction;Therapeutic activities;Functional mobility training;Therapeutic exercise;Balance training;Patient/family education;Wheelchair mobility training    PT Goals (Current goals can be found in the Care Plan section)  Acute Rehab PT Goals Patient Stated Goal: none stated PT Goal Formulation: With patient Time For Goal Achievement: 01/07/20 Potential to Achieve Goals: Good    Frequency Min 5X/week   Barriers to discharge Decreased caregiver support      Co-evaluation               AM-PAC PT "6 Clicks" Mobility  Outcome Measure Help needed turning from your back to your side while in a flat bed without using bedrails?: A Little Help needed moving from lying on your back to sitting on the side of a flat bed without using bedrails?: A Little Help needed moving to and from a bed to a chair (including a wheelchair)?: A Little Help needed standing up from a chair using your arms (e.g., wheelchair or bedside chair)?: Total Help needed to walk in hospital room?: Total Help needed climbing 3-5 steps with a railing? : Total 6 Click Score: 12    End of Session   Activity Tolerance: Patient tolerated treatment well Patient left: in bed;with call bell/phone within reach;with bed alarm set;with family/visitor  present Nurse Communication: Mobility status PT Visit Diagnosis: Pain;Other abnormalities of gait and mobility (R26.89) Pain - Right/Left: Left Pain - part of body: Leg    Time: 1335-1402 PT Time Calculation (min) (ACUTE ONLY): 27 min   Charges:   PT Evaluation $PT Eval Moderate Complexity: 1 Mod PT Treatments $Therapeutic Activity: 8-22 mins          Wyona Almas, PT, DPT Acute Rehabilitation Services Pager [phone] Office [phone]   Deno Etienne 12/24/2019, 4:05 PM

## 2019-12-24 NOTE — Progress Notes (Signed)
     Subjective: 2 Days Post-Op Procedure(s) (LRB): External fixation left spanning knee. CPT 20690 uniplane (Left) Awake, alert and oriented x 4. Pain is moderate to severe. Tolerating po nourishment and Po narcotic pain meds. Left knee external fixator intact.  Plan for return to OR on Monday 5/10 for ORIF right leg Patient reports pain as moderate.    Objective:   VITALS:  Temp:  [97.4 F (36.3 C)-98.3 F (36.8 C)] 97.6 F (36.4 C) (05/08 0509) Pulse Rate:  [80-96] 80 (05/08 0509) Resp:  [16-17] 16 (05/08 0509) BP: (99-107)/(65-72) 99/65 (05/08 0509) SpO2:  [96 %-98 %] 96 % (05/08 0509)  Neurologically intact ABD soft Neurovascular intact Sensation intact distally Intact pulses distally Dorsiflexion/Plantar flexion intact Incision: dressing C/D/I and no drainage No cellulitis present Compartment soft Homan's sign negative, able to move left toes and good palpable DP and PT left foot.    LABS Recent Labs    12/22/19 1436 12/23/19 0345  HGB 11.9* 11.5*  WBC 18.1* 15.5*  PLT 375 328   Recent Labs    12/22/19 1436 12/23/19 0345  NA 138  --   K 3.8  --   CL 108  --   CO2 23  --   BUN 20  --   CREATININE 1.17* 1.04*  GLUCOSE 136*  --    No results for input(s): LABPT, INR in the last 72 hours.   Assessment/Plan: 2 Days Post-Op Procedure(s) (LRB): External fixation left spanning knee. CPT 20690 uniplane (Left)  Advance diet  Dr. Doreatha Martin to perform ORIF left leg Monday.   Basil Dess 12/24/2019, 11:04 AMPatient ID: Cindy Mcclure, female   DOB: 1966/02/16, 54 y.o.   MRN: [id number]

## 2019-12-25 LAB — SURGICAL PCR SCREEN
MRSA, PCR: NEGATIVE
Staphylococcus aureus: POSITIVE — AB

## 2019-12-25 MED ORDER — ALUM & MAG HYDROXIDE-SIMETH 200-200-20 MG/5ML PO SUSP
30.0000 mL | Freq: Four times a day (QID) | ORAL | Status: DC | PRN
  Administered 2019-12-25 – 2020-01-03 (×3): 30 mL via ORAL
  Filled 2019-12-25 (×3): qty 30

## 2019-12-25 MED ORDER — ENSURE PRE-SURGERY PO LIQD
296.0000 mL | Freq: Once | ORAL | Status: AC
  Administered 2019-12-26: 296 mL via ORAL
  Filled 2019-12-25 (×2): qty 296

## 2019-12-25 NOTE — Progress Notes (Signed)
Physical Therapy Treatment Patient Details Name: LATERRA PIPKIN MRN: [id number] DOB: 03/04/1966 Today's Date: 12/25/2019    History of Present Illness 54 yo female s/p fall from ladder with ORIF L bicondylar tibial plateau fx, L fibial fx R rib fx  and R  fibula fx PMH chronic back pain, DDD, partial seizure, panic attacks    PT Comments    Pt able to tolerate > 3 hours sitting up in recliner. In conjunction with NT/RN, pt assisted onto [facility] and then back to bed via anterior to posterior transfer method. Requiring two person moderate assist; pt able to use her arms with cues to facilitate transition. Will continue to practice slide board vs anterior to posterior transfers and wheelchair mobility to progress.    Follow Up Recommendations  SNF;Supervision/Assistance - 24 hour     Equipment Recommendations  Wheelchair (measurements PT);Wheelchair cushion (measurements PT);Other (comment)(drop arm BSC, slide board)    Recommendations for Other Services       Precautions / Restrictions Precautions Precautions: Fall;Other (comment) Precaution Comments: LLE ex fix Restrictions Weight Bearing Restrictions: Yes RLE Weight Bearing: Non weight bearing LLE Weight Bearing: Non weight bearing    Mobility  Bed Mobility Overal bed mobility: Needs Assistance         Sit to supine: Mod assist   General bed mobility comments: Progressing from long sitting position in bed to supine with modA for BLE management  Transfers Overall transfer level: Needs assistance Equipment used: None Transfers: Comptroller transfers: Mod assist;+2 physical assistance;+2 safety/equipment   General transfer comment: Pt performed anterior-posterior transfer from chair > bed and then from bed <> BSC with modA + 2 and use of bed pad to guide hips. Cues for hand positioning so that pt could assist by pushing through arms.   Ambulation/Gait                  Stairs             Wheelchair Mobility    Modified Rankin (Stroke Patients Only)       Balance Overall balance assessment: Needs assistance Sitting-balance support: Feet unsupported Sitting balance-Leahy Scale: Good                                      Cognition Arousal/Alertness: Awake/alert Behavior During Therapy: WFL for tasks assessed/performed Overall Cognitive Status: Within Functional Limits for tasks assessed                                 General Comments: flat affect, needs extended time to process information       Exercises      General Comments        Pertinent Vitals/Pain Pain Assessment: No/denies pain    Home Living                      Prior Function            PT Goals (current goals can now be found in the care plan section) Acute Rehab PT Goals Patient Stated Goal: none stated Potential to Achieve Goals: Good Progress towards PT goals: Progressing toward goals    Frequency    Min 5X/week      PT Plan Current plan remains appropriate    Co-evaluation  AM-PAC PT "6 Clicks" Mobility   Outcome Measure  Help needed turning from your back to your side while in a flat bed without using bedrails?: A Little Help needed moving from lying on your back to sitting on the side of a flat bed without using bedrails?: A Little Help needed moving to and from a bed to a chair (including a wheelchair)?: A Lot Help needed standing up from a chair using your arms (e.g., wheelchair or bedside chair)?: Total Help needed to walk in hospital room?: Total Help needed climbing 3-5 steps with a railing? : Total 6 Click Score: 11    End of Session Equipment Utilized During Treatment: Gait belt Activity Tolerance: Patient tolerated treatment well Patient left: with call bell/phone within reach;in bed;with bed alarm set Nurse Communication: Mobility status PT Visit Diagnosis:  Pain;Other abnormalities of gait and mobility (R26.89) Pain - Right/Left: Left Pain - part of body: Leg     Time: [id number] PT Time Calculation (min) (ACUTE ONLY): 12 min  Charges:  $Therapeutic Activity: 8-22 mins                       Wyona Almas, PT, DPT Acute Rehabilitation Services Pager [phone] Office [place] 12/25/2019, 4:57 PM

## 2019-12-25 NOTE — Progress Notes (Signed)
Pt has tolerated well the splint on LLE, also been checked during the shift. LLE was kept elevated with pillows. Moderate amount of drainage noted on LLE dressing. Dressing reinforced with abdominal pad and ace wrap. Pin care site done during the shift.

## 2019-12-25 NOTE — Progress Notes (Signed)
Physical Therapy Treatment Patient Details Name: Cindy Mcclure MRN: [id number] DOB: [birth year] Today's Date: 12/25/2019    History of Present Illness 54 yo female s/p fall from ladder with ORIF L bicondylar tibial plateau fx, L fibial fx R rib fx  and R  fibula fx PMH chronic back pain, DDD, partial seizure, panic attacks    PT Comments    Pt reporting good pain control. Session focused on therapeutic exercises and trialing transfer out of bed to recliner via slide board. Pt requiring two person moderate assist for slide board transfer. Will need to be modI in order to return home. Will trial anterior to posterior transfer next session.   Follow Up Recommendations  SNF;Supervision/Assistance - 24 hour (uncertain family assist)     Equipment Recommendations  Wheelchair (measurements PT);Wheelchair cushion (measurements PT);Other (comment)(drop arm BSC, slide board)    Recommendations for Other Services       Precautions / Restrictions Precautions Precautions: Fall;Other (comment) Precaution Comments: LLE ex fix Splint/Cast - Date Prophylactic Dressing Applied (if applicable): [id number] Restrictions Weight Bearing Restrictions: Yes RLE Weight Bearing: Non weight bearing LLE Weight Bearing: Non weight bearing    Mobility  Bed Mobility Overal bed mobility: Needs Assistance Bed Mobility: Supine to Sit     Supine to sit: Min guard     General bed mobility comments: pt able to progress to eob this session without any cues  Transfers Overall transfer level: Needs assistance Equipment used: Sliding board Transfers: Lateral/Scoot Transfers          Lateral/Scoot Transfers: Mod assist;With slide board;+2 physical assistance;+2 safety/equipment General transfer comment: pt with mod cues for positioning of the sliding board. pt needed multiple cues for hand placement on sliding board. Pt with posterior pelvic tilt and needed cues throughout session to lean foward. pt required  total +2  to position posteriorly in the chair   Ambulation/Gait                 Stairs             Wheelchair Mobility    Modified Rankin (Stroke Patients Only)       Balance Overall balance assessment: Needs assistance Sitting-balance support: Feet unsupported Sitting balance-Leahy Scale: Good                                      Cognition Arousal/Alertness: Awake/alert Behavior During Therapy: WFL for tasks assessed/performed Overall Cognitive Status: Within Functional Limits for tasks assessed                                 General Comments: flat affect, needs extended time to process information       Exercises General Exercises - Lower Extremity Ankle Circles/Pumps: Left;10 reps;Supine Heel Slides: Right;5 reps;Supine Straight Leg Raises: Right;5 reps;Supine Other Exercises Other Exercises: OT removed splint during chair sitting Other Exercises: Ot returned after 2 hours and replaced splint to L LE    General Comments General comments (skin integrity, edema, etc.): edema in L LE appears less this session and decreased drainage with only drainage at most distal pin site      Pertinent Vitals/Pain Pain Assessment: No/denies pain    Home Living Family/patient expects to be discharged to:: Private residence Living Arrangements: Children Available Help at Discharge: Available PRN/intermittently Type of Home: House Home Access: Stairs  to enter   Home Layout: One level Home Equipment: None Additional Comments: daughter work schedule varies as to when she is off but will be working    Prior Function Level of Independence: Independent          PT Goals (current goals can now be found in the care plan section) Acute Rehab PT Goals Patient Stated Goal: none stated Potential to Achieve Goals: Good Progress towards PT goals: Progressing toward goals    Frequency    Min 5X/week      PT Plan Discharge plan  needs to be updated    Co-evaluation PT/OT/SLP Co-Evaluation/Treatment: Yes Reason for Co-Treatment: For patient/therapist safety;To address functional/ADL transfers   OT goals addressed during session: ADL's and self-care;Strengthening/ROM      AM-PAC PT "6 Clicks" Mobility   Outcome Measure  Help needed turning from your back to your side while in a flat bed without using bedrails?: A Little Help needed moving from lying on your back to sitting on the side of a flat bed without using bedrails?: A Little Help needed moving to and from a bed to a chair (including a wheelchair)?: A Lot Help needed standing up from a chair using your arms (e.g., wheelchair or bedside chair)?: Total Help needed to walk in hospital room?: Total Help needed climbing 3-5 steps with a railing? : Total 6 Click Score: 11    End of Session Equipment Utilized During Treatment: Gait belt Activity Tolerance: Patient tolerated treatment well Patient left: in chair;with call bell/phone within reach;with chair alarm set Nurse Communication: Mobility status PT Visit Diagnosis: Pain;Other abnormalities of gait and mobility (R26.89) Pain - Right/Left: Left Pain - part of body: Leg     Time: [id number] PT Time Calculation (min) (ACUTE ONLY): 21 min  Charges:  $Therapeutic Activity: 8-22 mins                       Wyona Almas, PT, DPT Acute Rehabilitation Services Pager [phone] Office [phone]    Deno Etienne 12/25/2019, 10:50 AM

## 2019-12-25 NOTE — Progress Notes (Signed)
Occupational Therapy note  Pt wearing splint all night and tolerated well. Pt's skin checked and assessed this session without concern. Pt able to complete 10 ankle pumps. Pt noted to have decreased drainage this session compared to previous sessions. Pt with splint off at this time to have 2 hours break from splint.   RN STAFF  Please check splint every 4 hours during shift ( remove splint , remove stockinette/ dressing present) to assess for: * pain * redness *swelling  If any symptoms above present remove splint for 15 minutes. If symptoms continue - keep the splint removed and notify OT staff [phone] immediately.   Keep the UE elevated at all times on pillows / towels.  Splint can be cleaned with warm soapy water and alcohol swab. Splint should not be placed in heat of any kind because the splint with mold into a new shape.    Fleeta Emmer, OTR/L  Acute Rehabilitation Services Pager: [phone] Office: [phone] .

## 2019-12-25 NOTE — Evaluation (Signed)
Occupational Therapy Evaluation Patient Details Name: Cindy Mcclure MRN: [id number] DOB: 1966/03/29 Today's Date: 12/25/2019    History of Present Illness 54 yo female s/p fall from ladder with ORIF L bicondylar tibial plateau fx, L fibial fx R rib fx  and R  fibula fx PMH chronic back pain, DDD, partial seizure, panic attacks   Clinical Impression   Patient is s/p L bicondylar tibial plateaur ORIF surgery resulting in functional limitations due to the deficits listed below (see OT problem list). Pt currently requires mod (A) with sliding board transfer. Pt will need to be MOD I with wheelchair transfer to return home.  Patient will benefit from skilled OT acutely to increase independence and safety with ADLS to allow discharge SNF.     Follow Up Recommendations  SNF(uncertain family (A))    Equipment Recommendations  3 in 1 bedside commode    Recommendations for Other Services Other (comment)(very flat affect question if need for chaplain or psych )     Precautions / Restrictions Precautions Precautions: Fall;Other (comment) Precaution Comments: LLE ex fix Splint/Cast - Date Prophylactic Dressing Applied (if applicable): [id number] Restrictions Weight Bearing Restrictions: Yes RLE Weight Bearing: Non weight bearing LLE Weight Bearing: Non weight bearing      Mobility Bed Mobility Overal bed mobility: Needs Assistance Bed Mobility: Supine to Sit     Supine to sit: Min guard     General bed mobility comments: pt able to progress to eob this session without any cues  Transfers Overall transfer level: Needs assistance Equipment used: Sliding board Transfers: Lateral/Scoot Transfers          Lateral/Scoot Transfers: Mod assist;With slide board General transfer comment: pt with mod cues for positioning of the sliding board. pt needed multiple cues for hand placement on sliding board. Pt with posterior pelvic tilt and needed cues throughout session to lean foward. pt  required total +2 mod (A) to position posteriorly in the chair     Balance                                           ADL either performed or assessed with clinical judgement   ADL Overall ADL's : Needs assistance/impaired Eating/Feeding: Independent   Grooming: Independent;Sitting   Upper Body Bathing: Set up   Lower Body Bathing: Moderate assistance   Upper Body Dressing : Set up   Lower Body Dressing: Moderate assistance   Toilet Transfer: Moderate assistance;Transfer board Toilet Transfer Details (indicate cue type and reason): simulated EOB to chair Toileting- Clothing Manipulation and Hygiene: Moderate assistance         General ADL Comments: pt transfered from supine to sitting then eob to chair with sliding board. pt with posterior lean with cues to stay anterior pelvic tilt     Vision Baseline Vision/History: Wears glasses Wears Glasses: At all times       Perception     Praxis      Pertinent Vitals/Pain Pain Assessment: No/denies pain     Hand Dominance Right   Extremity/Trunk Assessment             Communication Communication Communication: No difficulties   Cognition Arousal/Alertness: Awake/alert Behavior During Therapy: WFL for tasks assessed/performed Overall Cognitive Status: Within Functional Limits for tasks assessed  General Comments: flat affect, needs extended time to process information    General Comments  edema in L LE appears less this session and decreased drainage with only drainage at most distal pin site    Exercises Other Exercises Other Exercises: OT removed splint during chair sitting Other Exercises: Ot returned after 2 hours and replaced splint to L LE   Shoulder Instructions      Home Living Family/patient expects to be discharged to:: Private residence Living Arrangements: Children Available Help at Discharge: Available PRN/intermittently Type  of Home: House Home Access: Stairs to enter Technical brewer of Steps: 2   Home Layout: One level     Bathroom Shower/Tub: Teacher, early years/pre: Standard     Home Equipment: None   Additional Comments: daughter work schedule varies as to when she is off but will be working      Prior Functioning/Environment Level of Independence: Independent                 OT Problem List: Decreased strength;Decreased range of motion;Decreased activity tolerance;Impaired balance (sitting and/or standing);Pain;Decreased safety awareness;Decreased knowledge of use of DME or AE;Decreased knowledge of precautions      OT Treatment/Interventions: Patient/family education;Splinting    OT Goals(Current goals can be found in the care plan section) Acute Rehab OT Goals Patient Stated Goal: none stated OT Goal Formulation: With patient Time For Goal Achievement: 01/07/20 Potential to Achieve Goals: Good  OT Frequency: Min 3X/week   Barriers to D/C: Decreased caregiver support          Co-evaluation PT/OT/SLP Co-Evaluation/Treatment: Yes Reason for Co-Treatment: For patient/therapist safety   OT goals addressed during session: ADL's and self-care;Strengthening/ROM      AM-PAC OT "6 Clicks" Daily Activity     Outcome Measure Help from another person eating meals?: None Help from another person taking care of personal grooming?: None Help from another person toileting, which includes using toliet, bedpan, or urinal?: A Lot Help from another person bathing (including washing, rinsing, drying)?: A Lot Help from another person to put on and taking off regular upper body clothing?: A Little Help from another person to put on and taking off regular lower body clothing?: A Lot 6 Click Score: 17   End of Session Equipment Utilized During Treatment: Gait belt Nurse Communication: Mobility status;Precautions  Activity Tolerance: Patient tolerated treatment well Patient  left: in chair;with call bell/phone within reach;with chair alarm set  OT Visit Diagnosis: Unsteadiness on feet (R26.81);Muscle weakness (generalized) (M62.81);Pain Pain - Right/Left: Left Pain - part of body: Leg                Time: 0808-0820(1010-1018) OT Time Calculation (min): 12 min Charges:  OT General Charges $OT Visit: 1 Visit OT Evaluation $OT Eval Moderate Complexity: 1 Mod OT Treatments $Orthotics/Prosthetics Check: 8-22 mins   Brynn, OTR/L  Acute Rehabilitation Services Pager: [phone] Office: [phone] .   Jeri Modena 12/25/2019, 10:20 AM

## 2019-12-25 NOTE — Progress Notes (Signed)
     Subjective: 3 Days Post-Op Procedure(s) (LRB): External fixation left spanning knee. CPT 20690 uniplane (Left) Awake, alert and oriented x 4. Left leg with ExFix spanning left knee for bicondylar tibial plateau. Drowsy with medication. OOB to a recliner.   Patient reports pain as moderate.    Objective:   VITALS:  Temp:  [97.7 F (36.5 C)-98.1 F (36.7 C)] 97.7 F (36.5 C) (05/09 1031) Pulse Rate:  [80-100] 85 (05/09 1031) Resp:  [17-18] 17 (05/09 1031) BP: (90-124)/(57-82) 90/57 (05/09 1031) SpO2:  [96 %-100 %] 96 % (05/09 1031)  Neurologically intact ABD soft Neurovascular intact Sensation intact distally Intact pulses distally Dorsiflexion/Plantar flexion intact Incision: moderate drainage No cellulitis present Compartment soft   LABS Recent Labs    12/22/19 1436 12/23/19 0345  HGB 11.9* 11.5*  WBC 18.1* 15.5*  PLT 375 328   Recent Labs    12/22/19 1436 12/23/19 0345  NA 138  --   K 3.8  --   CL 108  --   CO2 23  --   BUN 20  --   CREATININE 1.17* 1.04*  GLUCOSE 136*  --    No results for input(s): LABPT, INR in the last 72 hours.   Assessment/Plan: 3 Days Post-Op Procedure(s) (LRB): External fixation left spanning knee. CPT 20690 uniplane (Left)  Advance diet Up with therapy  OR tomorrow for ORIF of left proximal tibia fracture. Dr. Doreatha Martin. ERAS protocol, NPO x for clear liquids and 3 hour prior to surgery nutrient supplement, no diabetes.  Check lab CBC and CMET today.   Basil Dess 12/25/2019, 12:42 PMPatient ID: Cindy Mcclure, female   DOB: May 28, 1966, 54 y.o.   MRN: [id number]

## 2019-12-25 NOTE — Plan of Care (Signed)

## 2019-12-26 ENCOUNTER — Encounter (HOSPITAL_COMMUNITY): Payer: Self-pay | Admitting: Orthopaedic Surgery

## 2019-12-26 ENCOUNTER — Encounter (HOSPITAL_COMMUNITY)
Admission: EM | Disposition: A | Discharge status: Skilled Nursing Facility | Payer: Self-pay | Source: Home / Self Care | Attending: Student

## 2019-12-26 ENCOUNTER — Inpatient Hospital Stay (HOSPITAL_COMMUNITY): Payer: 59 | Admitting: Anesthesiology

## 2019-12-26 ENCOUNTER — Inpatient Hospital Stay (HOSPITAL_COMMUNITY): Payer: 59

## 2019-12-26 DIAGNOSIS — S92061A Displaced intraarticular fracture of right calcaneus, initial encounter for closed fracture: Secondary | ICD-10-CM

## 2019-12-26 DIAGNOSIS — S82152A Displaced fracture of left tibial tuberosity, initial encounter for closed fracture: Secondary | ICD-10-CM

## 2019-12-26 DIAGNOSIS — S82839A Other fracture of upper and lower end of unspecified fibula, initial encounter for closed fracture: Secondary | ICD-10-CM

## 2019-12-26 DIAGNOSIS — W11XXXA Fall on and from ladder, initial encounter: Secondary | ICD-10-CM

## 2019-12-26 HISTORY — PX: ORIF TIBIA PLATEAU: SHX2132

## 2019-12-26 HISTORY — PX: ORIF CALCANEOUS FRACTURE: SHX5030

## 2019-12-26 HISTORY — PX: EXTERNAL FIXATION REMOVAL: SHX5040

## 2019-12-26 LAB — CBC WITH DIFFERENTIAL/PLATELET
Abs Immature Granulocytes: 0.06 10*3/uL (ref 0.00–0.07)
Basophils Absolute: 0.1 10*3/uL (ref 0.0–0.1)
Basophils Relative: 1 %
Eosinophils Absolute: 0.4 10*3/uL (ref 0.0–0.5)
Eosinophils Relative: 3 %
HCT: 24.7 % — ABNORMAL LOW (ref 36.0–46.0)
Hemoglobin: 7.9 g/dL — ABNORMAL LOW (ref 12.0–15.0)
Immature Granulocytes: 1 %
Lymphocytes Relative: 22 %
Lymphs Abs: 2.4 10*3/uL (ref 0.7–4.0)
MCH: 30.7 pg (ref 26.0–34.0)
MCHC: 32 g/dL (ref 30.0–36.0)
MCV: 96.1 fL (ref 80.0–100.0)
Monocytes Absolute: 0.8 10*3/uL (ref 0.1–1.0)
Monocytes Relative: 7 %
Neutro Abs: 7.4 10*3/uL (ref 1.7–7.7)
Neutrophils Relative %: 66 %
Platelets: 261 10*3/uL (ref 150–400)
RBC: 2.57 MIL/uL — ABNORMAL LOW (ref 3.87–5.11)
RDW: 13.7 % (ref 11.5–15.5)
WBC: 11 10*3/uL — ABNORMAL HIGH (ref 4.0–10.5)
nRBC: 0.3 % — ABNORMAL HIGH (ref 0.0–0.2)

## 2019-12-26 LAB — COMPREHENSIVE METABOLIC PANEL
ALT: 15 U/L (ref 0–44)
AST: 27 U/L (ref 15–41)
Albumin: 2.8 g/dL — ABNORMAL LOW (ref 3.5–5.0)
Alkaline Phosphatase: 112 U/L (ref 38–126)
Anion gap: 7 (ref 5–15)
BUN: 15 mg/dL (ref 6–20)
CO2: 27 mmol/L (ref 22–32)
Calcium: 8.4 mg/dL — ABNORMAL LOW (ref 8.9–10.3)
Chloride: 105 mmol/L (ref 98–111)
Creatinine, Ser: 1 mg/dL (ref 0.44–1.00)
GFR calc Af Amer: >60 mL/min (ref >60)
GFR calc non Af Amer: >60 mL/min (ref >60)
Glucose, Bld: 114 mg/dL — ABNORMAL HIGH (ref 70–99)
Potassium: 3.8 mmol/L (ref 3.5–5.1)
Sodium: 139 mmol/L (ref 135–145)
Total Bilirubin: 0.6 mg/dL (ref 0.3–1.2)
Total Protein: 5.6 g/dL — ABNORMAL LOW (ref 6.5–8.1)

## 2019-12-26 LAB — HEMOGLOBIN AND HEMATOCRIT, BLOOD
HCT: 26.8 % — ABNORMAL LOW (ref 36.0–46.0)
Hemoglobin: 8.6 g/dL — ABNORMAL LOW (ref 12.0–15.0)

## 2019-12-26 LAB — PREPARE RBC (CROSSMATCH)

## 2019-12-26 LAB — ABO/RH: ABO/RH(D): AB POS

## 2019-12-26 SURGERY — OPEN REDUCTION INTERNAL FIXATION (ORIF) TIBIAL PLATEAU
Anesthesia: General | Site: Leg Upper | Laterality: Right

## 2019-12-26 MED ORDER — FUROSEMIDE 10 MG/ML IJ SOLN
20.0000 mg | Freq: Once | INTRAMUSCULAR | Status: DC
  Filled 2019-12-26: qty 2

## 2019-12-26 MED ORDER — 0.9 % SODIUM CHLORIDE (POUR BTL) OPTIME
TOPICAL | Status: DC | PRN
  Administered 2019-12-26: 11:00:00 1000 mL

## 2019-12-26 MED ORDER — ENOXAPARIN SODIUM 40 MG/0.4ML ~~LOC~~ SOLN: 40.0000 mg | SUBCUTANEOUS | Status: DC

## 2019-12-26 MED ORDER — KETAMINE HCL 50 MG/5ML IJ SOSY
PREFILLED_SYRINGE | INTRAMUSCULAR | Status: AC
  Filled 2019-12-26: qty 5

## 2019-12-26 MED ORDER — LACTATED RINGERS IV SOLN: INTRAVENOUS | Status: DC

## 2019-12-26 MED ORDER — FENTANYL CITRATE (PF) 250 MCG/5ML IJ SOLN
INTRAMUSCULAR | Status: AC
  Filled 2019-12-26: qty 5

## 2019-12-26 MED ORDER — GABAPENTIN 100 MG PO CAPS
100.0000 mg | ORAL_CAPSULE | Freq: Three times a day (TID) | ORAL | Status: DC
  Administered 2019-12-26 – 2020-01-04 (×29): 100 mg via ORAL
  Filled 2019-12-26 (×29): qty 1

## 2019-12-26 MED ORDER — FENTANYL CITRATE (PF) 100 MCG/2ML IJ SOLN
INTRAMUSCULAR | Status: AC
  Filled 2019-12-26: qty 2

## 2019-12-26 MED ORDER — ADULT MULTIVITAMIN W/MINERALS CH
1.0000 | ORAL_TABLET | Freq: Every day | ORAL | Status: DC
  Administered 2019-12-27 – 2020-01-04 (×9): 1 via ORAL
  Filled 2019-12-26 (×10): qty 1

## 2019-12-26 MED ORDER — VANCOMYCIN HCL 1000 MG IV SOLR
INTRAVENOUS | Status: DC | PRN
  Administered 2019-12-26 (×2): 1000 mg

## 2019-12-26 MED ORDER — ROCURONIUM BROMIDE 10 MG/ML (PF) SYRINGE
PREFILLED_SYRINGE | INTRAVENOUS | Status: AC
  Filled 2019-12-26: qty 10

## 2019-12-26 MED ORDER — PHENYLEPHRINE HCL-NACL 10-0.9 MG/250ML-% IV SOLN
INTRAVENOUS | Status: DC | PRN
Start: 2019-12-26 — End: 2019-12-26
  Administered 2019-12-26: 25 ug/min via INTRAVENOUS

## 2019-12-26 MED ORDER — LIDOCAINE 2% (20 MG/ML) 5 ML SYRINGE
INTRAMUSCULAR | Status: DC | PRN
  Administered 2019-12-26: 80 mg via INTRAVENOUS

## 2019-12-26 MED ORDER — ASPIRIN 325 MG PO TABS
325.0000 mg | ORAL_TABLET | Freq: Every day | ORAL | Status: DC
  Administered 2019-12-27 – 2020-01-04 (×9): 325 mg via ORAL
  Filled 2019-12-26 (×9): qty 1

## 2019-12-26 MED ORDER — ENSURE ENLIVE PO LIQD
237.0000 mL | Freq: Two times a day (BID) | ORAL | Status: DC
  Administered 2019-12-27 – 2020-01-02 (×12): 237 mL via ORAL
  Filled 2019-12-26: qty 237

## 2019-12-26 MED ORDER — LAMOTRIGINE 150 MG PO TABS
150.0000 mg | ORAL_TABLET | ORAL | Status: AC
  Administered 2019-12-26: 150 mg via ORAL
  Filled 2019-12-26: qty 1

## 2019-12-26 MED ORDER — CEFAZOLIN SODIUM-DEXTROSE 2-3 GM-%(50ML) IV SOLR
INTRAVENOUS | Status: DC | PRN
Start: 2019-12-26 — End: 2019-12-26
  Administered 2019-12-26: 2 g via INTRAVENOUS

## 2019-12-26 MED ORDER — SUGAMMADEX SODIUM 200 MG/2ML IV SOLN
INTRAVENOUS | Status: DC | PRN
  Administered 2019-12-26: 200 mg via INTRAVENOUS

## 2019-12-26 MED ORDER — LEVETIRACETAM 750 MG PO TABS
750.0000 mg | ORAL_TABLET | ORAL | Status: AC
  Administered 2019-12-26: 750 mg via ORAL
  Filled 2019-12-26: qty 1

## 2019-12-26 MED ORDER — VANCOMYCIN HCL 1000 MG IV SOLR
INTRAVENOUS | Status: AC
  Filled 2019-12-26: qty 2000

## 2019-12-26 MED ORDER — FENTANYL CITRATE (PF) 100 MCG/2ML IJ SOLN
25.0000 ug | INTRAMUSCULAR | Status: DC | PRN
  Administered 2019-12-26: 25 ug via INTRAVENOUS
  Administered 2019-12-26: 50 ug via INTRAVENOUS

## 2019-12-26 MED ORDER — CEFAZOLIN SODIUM-DEXTROSE 2-4 GM/100ML-% IV SOLN
2.0000 g | Freq: Three times a day (TID) | INTRAVENOUS | Status: AC
  Administered 2019-12-27 (×2): 2 g via INTRAVENOUS
  Filled 2019-12-26 (×3): qty 100

## 2019-12-26 MED ORDER — SODIUM CHLORIDE 0.9% IV SOLUTION: Freq: Once | INTRAVENOUS | Status: DC

## 2019-12-26 MED ORDER — PHENYLEPHRINE 40 MCG/ML (10ML) SYRINGE FOR IV PUSH (FOR BLOOD PRESSURE SUPPORT)
PREFILLED_SYRINGE | INTRAVENOUS | Status: AC
  Filled 2019-12-26: qty 10

## 2019-12-26 MED ORDER — ROCURONIUM BROMIDE 10 MG/ML (PF) SYRINGE
PREFILLED_SYRINGE | INTRAVENOUS | Status: DC | PRN
  Administered 2019-12-26: 30 mg via INTRAVENOUS
  Administered 2019-12-26: 20 mg via INTRAVENOUS
  Administered 2019-12-26: 10 mg via INTRAVENOUS
  Administered 2019-12-26: 60 mg via INTRAVENOUS
  Administered 2019-12-26: 40 mg via INTRAVENOUS

## 2019-12-26 MED ORDER — LIDOCAINE 2% (20 MG/ML) 5 ML SYRINGE
INTRAMUSCULAR | Status: AC
  Filled 2019-12-26: qty 5

## 2019-12-26 MED ORDER — MIDAZOLAM HCL 2 MG/2ML IJ SOLN
INTRAMUSCULAR | Status: AC
  Filled 2019-12-26: qty 2

## 2019-12-26 MED ORDER — ONDANSETRON HCL 4 MG/2ML IJ SOLN
INTRAMUSCULAR | Status: DC | PRN
  Administered 2019-12-26: 4 mg via INTRAVENOUS

## 2019-12-26 MED ORDER — FENTANYL CITRATE (PF) 250 MCG/5ML IJ SOLN
INTRAMUSCULAR | Status: DC | PRN
  Administered 2019-12-26 (×5): 50 ug via INTRAVENOUS
  Administered 2019-12-26: 100 ug via INTRAVENOUS
  Administered 2019-12-26 (×2): 50 ug via INTRAVENOUS

## 2019-12-26 MED ORDER — DEXAMETHASONE SODIUM PHOSPHATE 10 MG/ML IJ SOLN
INTRAMUSCULAR | Status: AC
  Filled 2019-12-26: qty 1

## 2019-12-26 MED ORDER — KETAMINE HCL 10 MG/ML IJ SOLN
INTRAMUSCULAR | Status: DC | PRN
  Administered 2019-12-26: 20 mg via INTRAVENOUS
  Administered 2019-12-26 (×3): 10 mg via INTRAVENOUS

## 2019-12-26 MED ORDER — ONDANSETRON HCL 4 MG/2ML IJ SOLN
INTRAMUSCULAR | Status: AC
  Filled 2019-12-26: qty 2

## 2019-12-26 MED ORDER — PROPOFOL 10 MG/ML IV BOLUS
INTRAVENOUS | Status: DC | PRN
  Administered 2019-12-26: 130 mg via INTRAVENOUS

## 2019-12-26 MED ORDER — ACETAMINOPHEN 325 MG PO TABS
650.0000 mg | ORAL_TABLET | Freq: Once | ORAL | Status: AC
  Administered 2019-12-26: 650 mg via ORAL
  Filled 2019-12-26: qty 2

## 2019-12-26 MED ORDER — DIPHENHYDRAMINE HCL 50 MG/ML IJ SOLN
25.0000 mg | Freq: Once | INTRAMUSCULAR | Status: DC
  Filled 2019-12-26: qty 1

## 2019-12-26 MED ORDER — DEXAMETHASONE SODIUM PHOSPHATE 10 MG/ML IJ SOLN
INTRAMUSCULAR | Status: DC | PRN
  Administered 2019-12-26: 4 mg via INTRAVENOUS

## 2019-12-26 MED ORDER — CEFAZOLIN SODIUM-DEXTROSE 2-4 GM/100ML-% IV SOLN
INTRAVENOUS | Status: AC
  Administered 2019-12-26: 2 g via INTRAVENOUS
  Filled 2019-12-26: qty 100

## 2019-12-26 MED ORDER — ALBUMIN HUMAN 5 % IV SOLN: INTRAVENOUS | Status: DC | PRN

## 2019-12-26 SURGICAL SUPPLY — 121 items
APL PRP STRL LF DISP 70% ISPRP (MISCELLANEOUS) ×6
BANDAGE ESMARK 6X9 LF (GAUZE/BANDAGES/DRESSINGS) ×3 IMPLANT
BIT DRILL CALIB QC 170X80 (BIT) ×2 IMPLANT
BIT DRILL CALIBRATED 2.7 (BIT) ×1 IMPLANT
BIT DRILL CALIBRATED 2.7MM (BIT) ×1
BIT DRILL QC SFS 2.5X170 (BIT) ×2 IMPLANT
BLADE CLIPPER SURG (BLADE) IMPLANT
BLADE SURG 10 STRL SS (BLADE) ×5 IMPLANT
BLADE SURG 15 STRL LF DISP TIS (BLADE) ×3 IMPLANT
BLADE SURG 15 STRL SS (BLADE) ×10
BNDG CMPR 9X6 STRL LF SNTH (GAUZE/BANDAGES/DRESSINGS) ×3
BNDG CMPR MED 15X6 ELC VLCR LF (GAUZE/BANDAGES/DRESSINGS) ×3
BNDG COHESIVE 4X5 TAN STRL (GAUZE/BANDAGES/DRESSINGS) ×5 IMPLANT
BNDG ELASTIC 4X5.8 VLCR STR LF (GAUZE/BANDAGES/DRESSINGS) ×5 IMPLANT
BNDG ELASTIC 6X15 VLCR STRL LF (GAUZE/BANDAGES/DRESSINGS) ×2 IMPLANT
BNDG ELASTIC 6X5.8 VLCR STR LF (GAUZE/BANDAGES/DRESSINGS) ×5 IMPLANT
BNDG ESMARK 6X9 LF (GAUZE/BANDAGES/DRESSINGS) ×5
BNDG GAUZE ELAST 4 BULKY (GAUZE/BANDAGES/DRESSINGS) ×5 IMPLANT
BRUSH SCRUB EZ PLAIN DRY (MISCELLANEOUS) ×10 IMPLANT
CANISTER SUCT 3000ML PPV (MISCELLANEOUS) ×5 IMPLANT
CHLORAPREP W/TINT 26 (MISCELLANEOUS) ×10 IMPLANT
CONNECTOR 5 IN 1 STRAIGHT STRL (MISCELLANEOUS) ×5 IMPLANT
COVER MAYO STAND STRL (DRAPES) ×5 IMPLANT
COVER SURGICAL LIGHT HANDLE (MISCELLANEOUS) ×10 IMPLANT
COVER WAND RF STERILE (DRAPES) ×5 IMPLANT
CUFF TOURN SGL QUICK 18X4 (TOURNIQUET CUFF) ×2 IMPLANT
CUFF TOURN SGL QUICK 34 (TOURNIQUET CUFF) ×5
CUFF TRNQT CYL 34X4.125X (TOURNIQUET CUFF) ×3 IMPLANT
DRAPE C-ARM 42X72 X-RAY (DRAPES) ×5 IMPLANT
DRAPE C-ARMOR (DRAPES) ×5 IMPLANT
DRAPE INCISE IOBAN 66X45 STRL (DRAPES) ×5 IMPLANT
DRAPE ORTHO SPLIT 77X108 STRL (DRAPES) ×10
DRAPE SURG ORHT 6 SPLT 77X108 (DRAPES) ×6 IMPLANT
DRAPE U-SHAPE 47X51 STRL (DRAPES) ×5 IMPLANT
DRSG EMULSION OIL 3X3 NADH (GAUZE/BANDAGES/DRESSINGS) ×5 IMPLANT
DRSG MEPILEX BORDER 4X8 (GAUZE/BANDAGES/DRESSINGS) ×2 IMPLANT
DRSG MEPITEL 4X7.2 (GAUZE/BANDAGES/DRESSINGS) ×4 IMPLANT
DRSG PAD ABDOMINAL 8X10 ST (GAUZE/BANDAGES/DRESSINGS) ×10 IMPLANT
ELECT REM PT RETURN 9FT ADLT (ELECTROSURGICAL) ×5
ELECTRODE REM PT RTRN 9FT ADLT (ELECTROSURGICAL) ×3 IMPLANT
GAUZE SPONGE 4X4 12PLY STRL (GAUZE/BANDAGES/DRESSINGS) ×5 IMPLANT
GAUZE SPONGE 4X4 12PLY STRL LF (GAUZE/BANDAGES/DRESSINGS) ×4 IMPLANT
GLOVE BIO SURGEON STRL SZ 6.5 (GLOVE) ×12 IMPLANT
GLOVE BIO SURGEON STRL SZ7.5 (GLOVE) ×20 IMPLANT
GLOVE BIO SURGEONS STRL SZ 6.5 (GLOVE) ×3
GLOVE BIOGEL PI IND STRL 6.5 (GLOVE) ×3 IMPLANT
GLOVE BIOGEL PI IND STRL 7.5 (GLOVE) ×3 IMPLANT
GLOVE BIOGEL PI INDICATOR 6.5 (GLOVE) ×2
GLOVE BIOGEL PI INDICATOR 7.5 (GLOVE) ×2
GOWN STRL REUS W/ TWL LRG LVL3 (GOWN DISPOSABLE) ×6 IMPLANT
GOWN STRL REUS W/TWL LRG LVL3 (GOWN DISPOSABLE) ×10
IMMOBILIZER KNEE 22 UNIV (SOFTGOODS) ×5 IMPLANT
K-WIRE .045X4 (WIRE) ×4 IMPLANT
K-WIRE 1.6X150 (WIRE) ×5
KIT BASIN OR (CUSTOM PROCEDURE TRAY) ×5 IMPLANT
KIT TURNOVER KIT B (KITS) ×5 IMPLANT
KWIRE 1.6X150 (WIRE) IMPLANT
MANIFOLD NEPTUNE II (INSTRUMENTS) ×5 IMPLANT
NDL HYPO 21X1.5 SAFETY (NEEDLE) IMPLANT
NDL SUT 6 .5 CRC .975X.05 MAYO (NEEDLE) ×3 IMPLANT
NEEDLE HYPO 21X1.5 SAFETY (NEEDLE) IMPLANT
NEEDLE MAYO TAPER (NEEDLE) ×5
NS IRRIG 1000ML POUR BTL (IV SOLUTION) ×5 IMPLANT
PACK ORTHO EXTREMITY (CUSTOM PROCEDURE TRAY) ×5 IMPLANT
PACK TOTAL JOINT (CUSTOM PROCEDURE TRAY) ×5 IMPLANT
PAD ABD 8X10 STRL (GAUZE/BANDAGES/DRESSINGS) ×4 IMPLANT
PAD ARMBOARD 7.5X6 YLW CONV (MISCELLANEOUS) ×10 IMPLANT
PAD CAST 4YDX4 CTTN HI CHSV (CAST SUPPLIES) ×6 IMPLANT
PADDING CAST COTTON 4X4 STRL (CAST SUPPLIES) ×10
PADDING CAST COTTON 6X4 STRL (CAST SUPPLIES) ×9 IMPLANT
PLATE CALC MIS EXTEND SM RT (Plate) ×2 IMPLANT
PLATE VA-LCP TIB 8H 3.5 STRL (Plate) ×2 IMPLANT
SCREW 3.5X60 (Screw) ×2 IMPLANT
SCREW CORTEX 3.5 26MM (Screw) ×2 IMPLANT
SCREW CORTEX 3.5 28MM (Screw) ×2 IMPLANT
SCREW CORTEX 3.5 30MM (Screw) ×2 IMPLANT
SCREW CORTEX 3.5 34MM (Screw) ×2 IMPLANT
SCREW CORTICAL LOW PROF 3.5X32 (Screw) ×2 IMPLANT
SCREW HEADED ST 3.5X64 (Screw) ×2 IMPLANT
SCREW HEADED ST 3.5X70 (Screw) ×2 IMPLANT
SCREW LOCK CORT ST 3.5X26 (Screw) IMPLANT
SCREW LOCK CORT ST 3.5X28 (Screw) IMPLANT
SCREW LOCK CORT ST 3.5X30 (Screw) IMPLANT
SCREW LOCK CORT ST 3.5X34 (Screw) IMPLANT
SCREW LOCK CORT STAR 3.5X20 (Screw) ×2 IMPLANT
SCREW LOCK CORT STAR 3.5X28 (Screw) ×2 IMPLANT
SCREW LOCK CORT STAR 3.5X32 (Screw) ×2 IMPLANT
SCREW LOCK CORT STAR 3.5X38 (Screw) ×2 IMPLANT
SCREW LOCKING 3.5X70MM VA (Screw) ×2 IMPLANT
SCREW LOW PROF TIS 3.5X28MM (Screw) ×2 IMPLANT
SCREW SHANZ 4.0X60MM (EXFIX) IMPLANT
SCREW T15 LP CORT 3.5X40MM NS (Screw) ×2 IMPLANT
SCREW VA-LOCKING 65MM 3.5 (Screw) ×4 IMPLANT
SPLINT PLASTER CAST XFAST 5X30 (CAST SUPPLIES) IMPLANT
SPLINT PLASTER XFAST SET 5X30 (CAST SUPPLIES) ×4
SPONGE LAP 18X18 RF (DISPOSABLE) IMPLANT
STAPLER VISISTAT 35W (STAPLE) ×5 IMPLANT
SUCTION FRAZIER HANDLE 10FR (MISCELLANEOUS) ×5
SUCTION TUBE FRAZIER 10FR DISP (MISCELLANEOUS) ×3 IMPLANT
SUT ETHILON 2 0 FS 18 (SUTURE) ×3 IMPLANT
SUT ETHILON 3 0 PS 1 (SUTURE) ×10 IMPLANT
SUT FIBERWIRE #2 38 T-5 BLUE (SUTURE) ×10
SUT VIC AB 0 CT1 27 (SUTURE) ×20
SUT VIC AB 0 CT1 27XBRD ANBCTR (SUTURE) ×3 IMPLANT
SUT VIC AB 1 CT1 18XCR BRD 8 (SUTURE) IMPLANT
SUT VIC AB 1 CT1 27 (SUTURE)
SUT VIC AB 1 CT1 27XBRD ANBCTR (SUTURE) ×3 IMPLANT
SUT VIC AB 1 CT1 36 (SUTURE) ×2 IMPLANT
SUT VIC AB 1 CT1 8-18 (SUTURE)
SUT VIC AB 2-0 CT1 27 (SUTURE) ×5
SUT VIC AB 2-0 CT1 TAPERPNT 27 (SUTURE) ×6 IMPLANT
SUT VIC AB 2-0 CT2 18 VCP726D (SUTURE) ×2 IMPLANT
SUTURE FIBERWR #2 38 T-5 BLUE (SUTURE) IMPLANT
SYR CONTROL 10ML LL (SYRINGE) IMPLANT
TOWEL GREEN STERILE (TOWEL DISPOSABLE) ×10 IMPLANT
TOWEL GREEN STERILE FF (TOWEL DISPOSABLE) ×5 IMPLANT
TRAY FOLEY MTR SLVR 16FR STAT (SET/KITS/TRAYS/PACK) IMPLANT
TUBE CONNECTING 12'X1/4 (SUCTIONS) ×2
TUBE CONNECTING 12X1/4 (SUCTIONS) ×8 IMPLANT
UNDERPAD 30X36 HEAVY ABSORB (UNDERPADS AND DIAPERS) ×5 IMPLANT
WATER STERILE IRR 1000ML POUR (IV SOLUTION) ×10 IMPLANT

## 2019-12-26 NOTE — Progress Notes (Signed)
Patient ID: Cindy Mcclure, female   DOB: 11-23-1965, 54 y.o.   MRN: [id number] Hgb 7.9, She is to have surgery this AM at 11AM. I will transfuse 2 units PRBCs asap and check a H/H

## 2019-12-26 NOTE — Progress Notes (Signed)
Ortho Trauma Note  Patient seen and examined. Swelling appropriate to proceed with ORIF of bicondylar tibial plateau fracture and will assess swelling of calcaneus intraoperatively. Risks and benefits discussed and she agrees to proceed with surgery.  Shona Needles, MD [facility] [phone] (office) [email or web address]

## 2019-12-26 NOTE — Op Note (Signed)
Orthopaedic Surgery Operative Note (CSN: [id number] ) Date of Surgery: 12/26/2019  Admit Date: 12/22/2019   Diagnoses: Pre-Op Diagnoses: Left bicondylar tibial plateau fracture Right Sanders 2 calcaneus fracture Right distal fibular avulsion fracture  Post-Op Diagnosis: Same  Procedures: 1. CPT 27536-Open reduction internal fixation of left tibial plateau fracture 2. CPT 27540-Open reduction internal fixation of left tibial tubercle fracture 3. CPT 20694-Removal of external fixation left leg 4. CPT 28415-Open reduction internal fixation of right calcaneus 5. CPT 27788-Nonoperative management of right distal fibular avulsion fracture  Surgeons : Primary: Shona Needles, MD  Assistant: Patrecia Pace, PA-C  Location: OR 3   Anesthesia:General   Antibiotics: Ancef 2g preop with 1 gm vancomycin powder placed topically in plateau incision and calcaneus incision   Tourniquet time: Total Tourniquet Time Documented: Calf (Right) - 60 minutes Total: Calf (Right) - 60 minutes  Estimated Blood [id number] mL  Complications:None   Specimens:None  Implants: Implant Name Type Inv. Item Serial No. Manufacturer Lot No. LRB No. Used Action  SCREW HEADED ST 3.5X64 - [id number] Screw SCREW HEADED ST 3.5X64  [employer]   1 Implanted  PLATE VA-LCP TIBIA 8H 3.5MM - [id number] Plate PLATE VA-LCP TIBIA 8H 3.5MM  [employer]   1 Implanted  SCREW HEADED ST 3.5X70 - [id number] Screw SCREW HEADED ST 3.5X70  [employer]   1 Implanted  SCREW CORTEX 3.5 30MM - [id number] Screw SCREW CORTEX 3.5 30MM  [employer]   1 Implanted  SCREW CORTEX 3.5 28MM - [id number] Screw SCREW CORTEX 3.5 28MM  [employer]   1 Implanted  SCREW CORTEX 3.5 26MM - [id number] Screw SCREW CORTEX 3.5 26MM  [employer]   1 Implanted  SCREW VA-LOCKING 65MM 3.5 - [id number] Screw SCREW VA-LOCKING 65MM 3.5  [employer]   2 Implanted  SCREW LOCKING 3.5X70MM VA - [id number] Screw SCREW LOCKING 3.5X70MM VA  [employer]   1  Implanted  SCREW 3.5X60 - [id number] Screw SCREW 3.5X60  [employer]   1 Implanted  small two hole extended MiS, Right   [id number]   Right 1 Implanted  SCREW T15 LP CORT 3.5X40MM NS - [id number] Screw SCREW T15 LP CORT 3.5X40MM NS  ZIMMER [employer]ON(ORTH,TRAU,BIO,SG)  Right 1 Implanted  SCREW CORTICAL LOW PROF 3.5X32 - [id number] Screw SCREW CORTICAL LOW PROF 3.5X32  [employer](ORTH,TRAU,BIO,SG)  Right 1 Implanted  SCREW LOW PROF TIS 3.5X28MM - [id number] Screw SCREW LOW PROF TIS 3.5X28MM  [employer](ORTH,TRAU,BIO,SG)  Right 1 Implanted  SCREW LOCK CORT STAR 3.5X20 - [id number] Screw SCREW LOCK CORT STAR 3.5X20  ZIMMER [employer]ON(ORTH,TRAU,BIO,SG)  Right 1 Implanted  SCREW LOCK CORT STAR 3.5X28 - [id number] Screw SCREW LOCK CORT STAR 3.5X28  ZIMMER [employer]ON(ORTH,TRAU,BIO,SG)  Right 1 Implanted  SCREW LOCK CORT STAR 3.5X38 - [id number] Screw SCREW LOCK CORT STAR 3.5X38  ZIMMER [employer]ON(ORTH,TRAU,BIO,SG)  Right 1 Implanted  SCREW LOCK CORT STAR 3.5X38 - [id number] Screw SCREW LOCK CORT STAR 3.5X38  [employer]IMMER [employer]ON(ORTH,TRAU,BIO,SG)  Right 1 Implanted     Indications for Surgery: 54 year old female who fell from a ladder sustaining a left significantly displaced bicondylar tibial plateau fracture as well as a Sanders 2 intra-articular calcaneus fracture along with a distal fibular avulsion fracture consistent with a peroneal tubercle rupture/avulsion.  She was taken urgently for closed reduction and external fixation for her left tibial plateau fracture.  Due to the complexity of her injuries it was recommended that an orthopedic traumatologist take over her care.  I recommended proceeding with open reduction internal fixation of left  tibial plateau fracture as well as open reduction internal fixation of right calcaneus fracture.  Risks and benefits were discussed with the patient.  Risks included but not limited to bleeding, infection, malunion, nonunion, hardware failure, hardware irritation, nerve and blood vessel injury,  DVT, stiffness, posttraumatic arthritis, even the possibility anesthetic complications.  The patient agreed to proceed with surgery and consent was obtained.  Operative Findings: 1.  Left bicondylar tibial plateau fracture treated with open reduction internal fixation using Synthes VA proximal tibial locking plate. 2.  Free tibial tubercle fragment treated with open repair using #2 FiberWire suture in a Krackow fashion and tied down to the lateral tibial plateau plate 3.  Removal of external fixator from left lower extremity with debridement and closure of exfix pin sites. 4.  Open reduction internal fixation of Sanders 2 calcaneus fracture using Zimmer [employer]omet MIS calcaneus plate 5.  Peroneal tubercle avulsion from the distal fibula treated with nonoperative management after fixation of the calcaneus showed the peroneal tendons were without subluxation or dislocation.  Procedure: The patient was identified in the preoperative holding area. Consent was confirmed with the patient and their family and all questions were answered. The operative extremity was marked after confirmation with the patient. she was then brought back to the operating room by our anesthesia colleagues.  She was carefully transferred over to a radiolucent flat top table.  She was placed under general anesthetic.  A bump was placed under her left hip.  The external fixator of her left lower extremity was then removed.  The bilateral lower extremities were then prepped and draped in usual sterile fashion.  A timeout was performed to verify the patient, the procedures, and the extremities.  Preoperative antibiotics were dosed.  I for started out with the left tibial plateau fracture.  Fluoroscopic imaging was obtained to show the highly unstable nature of her injury.  I flexed her knee over a triangle.  I started out with an anterior lateral incision and carried it down through skin and subcutaneous tissue.  I split the IT band just  lateral to the patellar tendon and the patella and I developed the interval between the IT band and the capsule.  I performed subperiosteal dissection along the lateral cortex of the proximal tibia.  I continued this dissection all the way back into the fibular head was palpated.  I then performed a submeniscal arthrotomy visualizing the undersurface of the meniscus which showed no peripheral tear.  I was able to also visualize and dissect out the tibial tubercle which was a completely free fragment attached to the patellar tendon.  The capsule was tagged with #1 Vicryl sutures for later repair.    From my preoperative plan I felt that the posterior lateral impaction was unable to be disimpacted as her osteoporosis would have made this very difficult to obtain reduction and hold.  I also felt that it was not likely to cause any instability of the knee at the end of the case.  The most the comminution had occurred in the tibial spine region.  Most of the articular surface was still attached to the lateral condyle.  I then used AP fluoroscopy and manual palpation of the lateral condyle to reduce it to the medial condyle.  I held it provisionally with K wires from lateral to medial.  Once I had the lateral condyle connected to the posterior medial condyle, I focused on the tibial tubercle fragment.  It appeared from the preoperative CT scan that  there was a free tibial tubercle fragment that was in confluence with a anterior medial condyle fragment that had minimal articular surface on it.  To repair the tibial tubercle I decided to place #2 FiberWire in a Krackow fashion to reduce the tibial tubercle and to hold it and reduction at the end of the case.  I placed one of the limbs laterally in the patellar tendon from distal to proximal and then another limb in the central portion of the tendon from proximal to distal.  I then used the FiberWire to help reduce the tibial tubercle and the anterior medial fragment as  well.  Once I have the lateral condyle held provisionally to the medial condyle as well as the tibial tubercle reduced, I was able to palpate the reduction of the tibial shaft to the anterior medial fragment which I can palpate through my incision.  On a lateral view I was able to see the reduction of the anterior medial fragment to the posterior medial articular surface.  I did make a percutaneous incision and used a ball spike pusher to help reduce the anterior medial fragment distal and held this provisionally with a 1.6 mm K wires from anterior to posterior in the medial condyle.  Once I had provisional fixation and had my assistant reducing the shaft to the metaphysis I then used a Synthes VA proximal tibial locking plate and slid this submuscularly along the lateral cortex of the tibia.  I had opened the proximal portion in place with a K wire and then a percutaneous incision was made at the distal portion of the plate to alignment on the lateral view.  A nonlocking 3.5 millimeter screw was used to bring the plate flush to bone proximally.  I then placed a percutaneous 3.5 millimeter screws along the tibial shaft to bring the plate flush to bone distally.  Alignment was maintained in both the AP and lateral views.  I returned to the proximal segment to place 3.5 mm locking screws to hold the condyles together.  A kickstand screw was placed to reinforce the medial condyle as I was not going to proceed with a medial incision.  The locking screw did cross the fracture plane however I felt that reinforcement of the medial condyle was more important than crossing the fracture and creating too stiff of a construct.  A free needle was used to bring the FiberWire sutures for the tibial tubercle repair through the plate this was tied down.  The K wires that were provisionally holding the medial anterior segment were removed and there was no migration or displacement of the fragment.  Final fluoroscopic imaging was  obtained.  The incision was copiously irrigated.  A free needle was used to bring the tag sutures for the capsule through the plate and tied these down.  A gram of vancomycin powder was placed in the incision.  A layer closure of 0 Vicryl, 2-0 Vicryl and 3-0 nylon was used to close the skin.  Once the incisions were closed with the exfix pin sites were debrided and closed with 2-0 Vicryl and 3-0 nylon.  The left lower extremity was then covered and we turned our attention to the right calcaneus.  Using the lateral fluoroscopic imaging and confirming the fracture I made a sinus tarsi incision through skin and subcutaneous tissue.  I placed an calf tourniquet inflated to 300 mmHg.  Total tourniquet time as noted above.  I took care to protect the sural nerve through the  case.  I then reflected extensor digitorum brevis anteriorly and dorsally to be able access the sinus tarsi and the posterior facet.  The fracture was a Sanders 2 fracture with a small articular fragment laterally.  Using Broden's view I was able to reduce the fragment both radiographically and clinically.  I held it with 0.045 K wires.  I confirmed adequate positioning with fluoroscopy.  The overall architecture of the calcaneus was appropriate with no significant varus and the height maintained.  I then used a Cobb elevator to develop a path between the peroneal's and the lateral cortex of the calcaneus to place a Zimmer [employer]omet MIS plate.  The plate was positioned appropriately using fluoroscopic imaging.  I then held it provisionally with a K wire.  A nonlocking 3.5 millimeter screw was used to bring the plate flush to bone.  I then made a percutaneous incision along the lateral heel and placed nonlocking 3.5 millimeter screws into the tuberosity to hold the height and maintained the coronal alignment.  Using the jig I then placed a locking screws to hold the joint reduction and remove the 0.045 K wires.  Lastly I used the jig to provide  fixation to the anterior process of the calcaneus where the fracture had extended.  The jig was then removed.  Final fluoroscopic imaging was obtained.  The incisions were copiously irrigated.  I then dorsiflex and evert the foot and ankle and the peroneal tendons were located without any signs of subluxation or dislocation.  As result I felt that the peroneal tubercle avulsion from the fibula could be treated nonoperatively.  The tourniquet was deflated and a gram of vancomycin powder was placed into the incision.  It was closed with 0 Vicryl, 2-0 Vicryl and 3-0 nylon.  Sterile dressings were placed to bilateral lower extremities.  The left knee was placed in a knee immobilizer.  The right leg was placed in a well-padded short leg splint.  The patient was awoken from anesthesia and taken the PACU in stable condition.  Post Op Plan/Instructions: The patient will be nonweightbearing to bilateral lower extremities.  She will be placed in a hinged knee brace to the left lower extremity and be allowed for gentle range of motion of the knee.  She will be placed on aspirin for surgical prophylaxis and Lovenox for DVT prophylaxis.  We will have her continue with mobilization with physical and Occupational Therapy.  I was present and performed the entire surgery.  Patrecia Pace, PA-C did assist me throughout the case. An assistant was necessary given the difficulty in approach, maintenance of reduction and ability to instrument the fracture.   Katha Hamming, MD [facility]

## 2019-12-26 NOTE — Anesthesia Preprocedure Evaluation (Addendum)
Anesthesia Evaluation  Patient identified by MRN, date of birth, ID band Patient awake    Reviewed: Allergy & Precautions, NPO status , Patient's Chart, lab work & pertinent test results  History of Anesthesia Complications Negative for: history of anesthetic complications  Airway Mallampati: II  TM Distance: >3 FB Neck ROM: Full    Dental  (+) Teeth Intact, Dental Advisory Given   Pulmonary former smoker,    Pulmonary exam normal breath sounds clear to auscultation       Cardiovascular (-) hypertension+ angina (-) CAD, (-) Past MI and (-) Cardiac Stents  Rhythm:Regular Rate:Tachycardia     Neuro/Psych Seizures -, Well Controlled,  PSYCHIATRIC DISORDERS Anxiety Depression  Neuromuscular disease    GI/Hepatic Neg liver ROS, GERD  Medicated,  Endo/Other  Obesity   Renal/GU negative Renal ROS Bladder dysfunction      Musculoskeletal  (+) Arthritis , Osteoarthritis,    Abdominal   Peds  Hematology  (+) Blood dyscrasia, anemia ,   Anesthesia Other Findings Day of surgery medications reviewed with the patient.  Reproductive/Obstetrics                            Anesthesia Physical Anesthesia Plan  ASA: III  Anesthesia Plan: General   Post-op Pain Management:    Induction: Intravenous  PONV Risk Score and Plan: 3 and Midazolam, Dexamethasone and Ondansetron  Airway Management Planned: Oral ETT  Additional Equipment:   Intra-op Plan:   Post-operative Plan: Extubation in OR  Informed Consent: I have reviewed the patients History and Physical, chart, labs and discussed the procedure including the risks, benefits and alternatives for the proposed anesthesia with the patient or authorized representative who has indicated his/her understanding and acceptance.     Dental advisory given  Plan Discussed with: CRNA  Anesthesia Plan Comments:        Anesthesia Quick  Evaluation

## 2019-12-26 NOTE — Transfer of Care (Signed)
Immediate Anesthesia Transfer of Care Note  Patient: Cindy Mcclure  Procedure(s) Performed: OPEN REDUCTION INTERNAL FIXATION (ORIF) TIBIAL PLATEAU (Left Leg Upper) OPEN REDUCTION INTERNAL FIXATION (ORIF) CALCANEOUS FRACTURE (Right Foot) Removal External Fixation Leg (Left Leg Lower)  Patient Location: PACU  Anesthesia Type:General  Level of Consciousness: drowsy and patient cooperative  Airway & Oxygen Therapy: Patient Spontanous Breathing and Patient connected to face mask oxygen  Post-op Assessment: Report given to RN and Post -op Vital signs reviewed and stable  Post vital signs: Reviewed and stable  Last Vitals:  Vitals Value Taken Time  BP 125/86 12/26/19 1426  Temp    Pulse 101 12/26/19 1427  Resp 12 12/26/19 1427  SpO2 99 % 12/26/19 1427  Vitals shown include unvalidated device data.  Last Pain:  Vitals:   12/26/19 0452  TempSrc: Oral  PainSc:       Patients Stated Pain Goal: 3 ([id number])  Complications: No apparent anesthesia complications

## 2019-12-26 NOTE — Progress Notes (Signed)
Initial Nutrition Assessment  DOCUMENTATION CODES:   Obesity unspecified  INTERVENTION:   -Once diet is advanced, add:  -Ensure Enlive po BID, each supplement provides 350 kcal and 20 grams of protein -MVI with minerals daily  NUTRITION DIAGNOSIS:   Increased nutrient needs related to post-op healing as evidenced by estimated needs.  GOAL:   Patient will meet greater than or equal to 90% of their needs  MONITOR:   PO intake, Supplement acceptance, Diet advancement, Labs, Weight trends, Skin, I & O's  REASON FOR ASSESSMENT:   Consult Assessment of nutrition requirement/status  ASSESSMENT:   Cindy Mcclure was about 72ft up a ladder when she lost her balance and fell. She landed on her feet then fell backwards. She did not hit her head or black out. She c/o right ankle and left knee pain. She was brought to the ED where x-rays showed a right ankle/calc fx and left tibia plateau fx and orthopedic surgery was consulted.  Pt admitted s/p fall with rt ankle/calc fx, and lt tibia plateau fx.   5/6- s/p PROCEDURE: External fixation left spanning knee  Reviewed I/O's: -700 ml x 24 hours and -600 ml since admission  UOP: 700 ml x 24 hours  Pt currently NPO. Plan for ORIF of rt leg today.   Pt down in OR at time of visit. Unable to obtain further nutrition-related hisotry or complete nutrition-focused physical exam at this time.   Reviewed wt hx; wt has been stable over the past year.   Pt previously on a regular diet, however, no meal completion records available to review at this time.   Pt with increased nutritional needs to support post-operative healing; RD will order oral nutrition supplements once diet is advanced.   Medications reviewed and include colace and lasix.   Albumin has a half-life of 21 days and is strongly affected by stress response and inflammatory process, therefore, do not expect to see an improvement in this lab value during acute hospitalization. When a  patient presents with low albumin, it is likely skewed due to the acute inflammatory response.  Unless it is suspected that patient had poor PO intake or malnutrition prior to admission, then RD should not be consulted solely for low albumin. Note that low albumin is no longer used to diagnose malnutrition; [name] uses the new malnutrition guidelines published by the American Society for Parenteral and Enteral Nutrition (A.S.P.E.N.) and the Academy of Nutrition and Dietetics (AND).    Labs reviewed.   Diet Order:   Diet Order            Diet NPO time specified Except for: Ice Chips, Sips with Meds  Diet effective ____              EDUCATION NEEDS:   No education needs have been identified at this time  Skin:  Skin Assessment: Skin Integrity Issues: Skin Integrity Issues:: Incisions Incisions: closed lt leg  Last BM:  12/25/19  Height:   Ht Readings from Last 1 Encounters:  12/22/19 5' (1.524 m)    Weight:   Wt Readings from Last 1 Encounters:  12/22/19 77.1 kg    Ideal Body Weight:  45.5 kg  BMI:  Body mass index is 33.2 kg/m.  Estimated Nutritional Needs:   Kcal:  [id number]  Protein:  95-110 grams  Fluid:  > 1.7 L    Cindy Mcclure, RD, LDN, Cindy Mcclure Registered Dietitian II Certified Diabetes Care and Education Specialist Please refer to [facility] for RD and/or  RD on-call/weekend/after hours pager

## 2019-12-26 NOTE — Anesthesia Procedure Notes (Signed)
Procedure Name: Intubation Date/Time: 12/26/2019 10:15 AM Performed by: Renato Shin, CRNA Pre-anesthesia Checklist: Patient identified, Emergency Drugs available, Suction available and Patient being monitored Patient Re-evaluated:Patient Re-evaluated prior to induction Oxygen Delivery Method: Circle system utilized Preoxygenation: Pre-oxygenation with 100% oxygen Induction Type: IV induction Ventilation: Mask ventilation without difficulty Laryngoscope Size: Miller and 2 Grade View: Grade I Tube type: Oral Tube size: 7.0 mm Number of attempts: 1 Airway Equipment and Method: Stylet and Oral airway Placement Confirmation: ETT inserted through vocal cords under direct vision,  positive ETCO2 and breath sounds checked- equal and bilateral Secured at: 21 cm Tube secured with: Tape Dental Injury: Teeth and Oropharynx as per pre-operative assessment

## 2019-12-26 NOTE — Plan of Care (Signed)

## 2019-12-26 NOTE — Progress Notes (Signed)
Orthopedic Tech Progress Note Patient Details:  [name] [birth year] [id number] Called in order to [facility] for a [place] Patient ID: Cindy Mcclure, female   DOB: April 29, 1966, 54 y.o.   MRN: [id number]   Janit Pagan 12/26/2019, 2:54 PM

## 2019-12-26 NOTE — Progress Notes (Signed)
Brace replaced by outside vendor with hinged knee brace.

## 2019-12-26 NOTE — Progress Notes (Signed)
Daughter, Tilda Burrow, [phone]

## 2019-12-27 ENCOUNTER — Encounter: Payer: Self-pay | Admitting: *Deleted

## 2019-12-27 LAB — CBC
HCT: 23.1 % — ABNORMAL LOW (ref 36.0–46.0)
HCT: 22.8 % — ABNORMAL LOW (ref 36.0–46.0)
Hemoglobin: 7.3 g/dL — ABNORMAL LOW (ref 12.0–15.0)
Hemoglobin: 7.2 g/dL — ABNORMAL LOW (ref 12.0–15.0)
MCH: 30 pg (ref 26.0–34.0)
MCH: 30.5 pg (ref 26.0–34.0)
MCHC: 31.6 g/dL (ref 30.0–36.0)
MCHC: 31.6 g/dL (ref 30.0–36.0)
MCV: 95.1 fL (ref 80.0–100.0)
MCV: 96.6 fL (ref 80.0–100.0)
Platelets: 307 10*3/uL (ref 150–400)
Platelets: 300 10*3/uL (ref 150–400)
RBC: 2.43 MIL/uL — ABNORMAL LOW (ref 3.87–5.11)
RBC: 2.36 MIL/uL — ABNORMAL LOW (ref 3.87–5.11)
RDW: 13.8 % (ref 11.5–15.5)
RDW: 14 % (ref 11.5–15.5)
WBC: 13.6 10*3/uL — ABNORMAL HIGH (ref 4.0–10.5)
WBC: 12.9 10*3/uL — ABNORMAL HIGH (ref 4.0–10.5)
nRBC: 0.3 % — ABNORMAL HIGH (ref 0.0–0.2)
nRBC: 0.3 % — ABNORMAL HIGH (ref 0.0–0.2)

## 2019-12-27 MED ORDER — CHLORHEXIDINE GLUCONATE CLOTH 2 % EX PADS
6.0000 | MEDICATED_PAD | Freq: Every day | CUTANEOUS | Status: AC
  Administered 2019-12-27 – 2019-12-31 (×5): 6 via TOPICAL

## 2019-12-27 MED ORDER — MORPHINE SULFATE (PF) 2 MG/ML IV SOLN: 1.0000 mg | INTRAVENOUS | Status: DC | PRN

## 2019-12-27 MED ORDER — MUPIROCIN 2 % EX OINT
1.0000 "application " | TOPICAL_OINTMENT | Freq: Two times a day (BID) | CUTANEOUS | Status: AC
  Administered 2019-12-27 – 2019-12-31 (×10): 1 via NASAL
  Filled 2019-12-27 (×2): qty 22

## 2019-12-27 MED ORDER — ACETAMINOPHEN 325 MG PO TABS
650.0000 mg | ORAL_TABLET | Freq: Four times a day (QID) | ORAL | Status: DC
  Administered 2019-12-27 – 2020-01-04 (×33): 650 mg via ORAL
  Filled 2019-12-27 (×33): qty 2

## 2019-12-27 MED ORDER — OXYCODONE HCL 5 MG PO TABS
5.0000 mg | ORAL_TABLET | ORAL | Status: DC | PRN
  Administered 2019-12-27 – 2019-12-28 (×3): 10 mg via ORAL
  Administered 2019-12-28: 5 mg via ORAL
  Administered 2019-12-29: 10 mg via ORAL
  Administered 2019-12-29: 5 mg via ORAL
  Administered 2019-12-30 – 2020-01-01 (×3): 10 mg via ORAL
  Administered 2020-01-02 – 2020-01-03 (×2): 5 mg via ORAL
  Administered 2020-01-03: 10 mg via ORAL
  Administered 2020-01-04: 5 mg via ORAL
  Filled 2019-12-27 (×4): qty 2
  Filled 2019-12-27 (×3): qty 1
  Filled 2019-12-27: qty 2
  Filled 2019-12-27 (×2): qty 1
  Filled 2019-12-27 (×3): qty 2

## 2019-12-27 NOTE — Progress Notes (Signed)
Orthopaedic Trauma Progress Note  S: Doing fair this morning.  Having quite a bit of pain in both legs, left worse than right.  Will reevaluate pain medication regimen.  Patient tolerating diet and fluids.  Has had 2 BM since hospital admission.  Has not been up out of bed yet since surgery yesterday.  Denies any other concerns or questions this morning.  O:  Vitals:   12/26/19 2058 12/27/19 0300  BP: 115/68 125/65  Pulse: 98 99  Resp: 17 17  Temp: 97.7 F (36.5 C) 97.8 F (36.6 C)  SpO2: 100% 100%    General -sitting up in bed, eating breakfast.  No acute distress Respiratory -  No increased work of breathing.  Right lower extremity - Short leg splint in place.  Nontender above splint.  Sensation intact to light touch of the toes.  Knee motion without discomfort.  Able to wiggle her toes.  Extremity well perfused.  Brisk cap refill Left lower extremity - Hinge knee brace in place.  Dressing is clean, dry, intact.  Leg remains quite swollen but compartments are compressible.  Uninvolved hinged brace and patient tolerated very minimal amount of passive knee flexion.  Able to wiggle toes.  Ankle dorsiflexion/plantarflexion is intact.+ DP pulse  Imaging: Stable post op imaging.   Labs:  Results for orders placed or performed during the hospital encounter of 12/22/19 (from the past 24 hour(s))  CBC     Status: Abnormal   Collection Time: 12/27/19  3:58 AM  Result Value Ref Range   WBC 13.6 (H) 4.0 - 10.5 K/uL   RBC 2.43 (L) 3.87 - 5.11 MIL/uL   Hemoglobin 7.3 (L) 12.0 - 15.0 g/dL   HCT 23.1 (L) 36.0 - 46.0 %   MCV 95.1 80.0 - 100.0 fL   MCH 30.0 26.0 - 34.0 pg   MCHC 31.6 30.0 - 36.0 g/dL   RDW 13.8 11.5 - 15.5 %   Platelets 307 150 - 400 K/uL   nRBC 0.3 (H) 0.0 - 0.2 %    Assessment: 54 year old female s/p fall from ladder, 1 Day Post-Op   Injuries: 1.  Left bicondylar tibial plateau fracture s/p removal of external fixator and ORIF 2.  Right Sanders to calcaneus fracture s/p  ORIF 3.  Right distal fibular avulsion fracture s/p nonoperative management  Weightbearing: NWB BLE  Insicional and dressing care: Leave splint in place RLE.  Plan to change LLE dressing tomorrow  Orthopedic device(s): Splint RLE, hinged knee brace LLE  Okay to unlock hinged knee brace on left lower extremity for gentle range of motion.  Locked in full extension at night.   CV/Blood loss: Acute blood loss anemia, Hgb 7.3 this morning. Hemodynamically stable.  Recheck CBC this afternoon  Pain management:  1. Tylenol 650 mg q 6 hours scheduled 2. Robaxin 500 mg q 6 hours PRN 3. Oxycodone 5-10 mg q 4 hours PRN 4. Neurontin 100 mg TID 5. Morphine 1-2 mg q  hours PRN  VTE prophylaxis: Hold Lovenox today.  Recheck CBC tomorrow  ID:  Ancef 2gm post op  Foley/Lines:  No foley, KVO IVFs  Medical co-morbidities: Seizure disorder  Impediments to Fracture Healing: Polytrauma.  Vitamin D level pending, will start supplementation as indicated  Dispo: PT/OT evaluation today, dispo pending.  Plan to change dressings tomorrow.  Will check CBC again this afternoon. Will plan to restart Lovenox tomorrow if appropriate.  Follow - up plan: To be determined  Contact information:  Katha Hamming MD, Judson Roch  Ricci Barker PA-C   Jaylise Peek A. [facility] [phone] (office) [email or web address]

## 2019-12-27 NOTE — Anesthesia Postprocedure Evaluation (Signed)
Anesthesia Post Note  Patient: Cindy Mcclure  Procedure(s) Performed: OPEN REDUCTION INTERNAL FIXATION (ORIF) TIBIAL PLATEAU (Left Leg Upper) OPEN REDUCTION INTERNAL FIXATION (ORIF) CALCANEOUS FRACTURE (Right Foot) Removal External Fixation Leg (Left Leg Lower)     Patient location during evaluation: PACU Anesthesia Type: General Level of consciousness: awake and alert Pain management: pain level controlled Vital Signs Assessment: post-procedure vital signs reviewed and stable Respiratory status: spontaneous breathing, nonlabored ventilation, respiratory function stable and patient connected to nasal cannula oxygen Cardiovascular status: blood pressure returned to baseline and stable Postop Assessment: no apparent nausea or vomiting Anesthetic complications: no    Last Vitals:  Vitals:   12/27/19 0855 12/27/19 1300  BP: (!) 104/51 (!) 98/59  Pulse: (!) 105 100  Resp: 20 17  Temp: 36.9 C 37 C  SpO2: 100% 100%    Last Pain:  Vitals:   12/27/19 1300  TempSrc: Oral  PainSc:                  Rage Beever L Iker Nuttall

## 2019-12-27 NOTE — NC FL2 (Addendum)
[place] LEVEL OF CARE SCREENING TOOL     IDENTIFICATION  Patient Name: Cindy Mcclure Birthdate: [birth year] Sex: female Admission Date (Current Location): 12/22/2019  [place] and [place] Number:  Selena Lesser [id number] [place] and Address:  [facility] [place] [address], [place], [place] [address]      Provider Number: [id number]  Attending Physician Name and Address:  Shona Needles, MD  Relative Name and Phone Number:  Esperanza Richters, mother - [phone]    Current Level of Care: Hospital Recommended Level of Care: [place] Prior Approval Number:    Date Approved/Denied:   PASRR Number:    Discharge Plan: SNF    Current Diagnoses: Patient Active Problem List   Diagnosis Date Noted  . Closed displaced fracture of left tibial tuberosity 12/26/2019  . Fall from ladder 12/26/2019  . Closed displaced intra-articular fracture of right calcaneus 12/26/2019  . Avulsion fracture of distal fibula 12/26/2019  . Closed bicondylar fracture of left tibial plateau 12/22/2019  . 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 Tendinitis of upper biceps tendon of left shoulder 03/23/2019  . Nonrheumatic mitral valve regurgitation 03/22/2019  . MDD (major depressive disorder), single episode, mild ([place]) 02/24/2019  . GAD (generalized anxiety disorder) 02/24/2019  . Rotator cuff tendinitis, left 03/08/2018  . Traumatic incomplete tear of left rotator cuff 03/08/2018  . Left arm pain 01/05/2018  . Neck pain 01/05/2018  . Frequent falls 07/29/2016  . Mixed stress and urge urinary incontinence 07/01/2016  . Constipation, unspecified 05/05/2016  . Mixed incontinence 12/12/2015  . Cholecystitis with cholelithiasis 12/06/2015  . Cholecystitis   . Other specified anxiety disorders 04/26/2015  . Cervical radiculitis 04/20/2015  . Degeneration of intervertebral disc of cervical region 04/20/2015  . Encounter for  therapeutic drug level monitoring 01/18/2015  . Arthritis of knee, degenerative 12/29/2013  . Partial epilepsy with impairment of consciousness ([place]) 12/09/2011  . Allergic rhinitis 02/21/2008    Orientation RESPIRATION BLADDER Height & Weight     Self, Time, Situation, Place  Normal Continent Weight: 77.1 kg Height:  5' (152.4 cm)  BEHAVIORAL SYMPTOMS/MOOD NEUROLOGICAL BOWEL NUTRITION STATUS    [place] Continent Diet(see discharge summary)  AMBULATORY STATUS COMMUNICATION OF NEEDS Skin   Extensive Assist(Patient is nonweight-bearing for 6 weeks due to injuries) Verbally Surgical wounds, Bruising(bruising left arm, right ankle, surgical incisions on right leg and left leg)                       Personal Care Assistance Level of Assistance  Dressing     Dressing Assistance: Limited assistance     Functional Limitations Info  Sight, Hearing, Speech Sight Info: Adequate Hearing Info: Adequate Speech Info: Adequate    SPECIAL CARE FACTORS FREQUENCY  PT (By licensed PT), OT (By licensed OT)     PT Frequency: 5 times per week OT Frequency: 5 times per week            Contractures Contractures Info: Not present    Additional Factors Info  Code Status, Allergies Code Status Info: Full code Allergies Info: no known drug allergies           Current Medications (12/27/2019):  This is the current hospital active medication list Current Facility-Administered Medications  Medication Dose Route Frequency Provider Last Rate Last Admin  . 0.9 %  sodium chloride infusion (Manually program via Guardrails IV Fluids)   Intravenous Once Patrecia Pace  A, PA-C      . acetaminophen (TYLENOL) tablet 650 mg  650 mg Oral Q6H Patrecia Pace A, PA-C   650 mg at 12/27/19 0933  . alum & mag hydroxide-simeth (MAALOX/MYLANTA) 200-200-20 MG/5ML suspension 30 mL  30 mL Oral Q6H PRN Patrecia Pace A, PA-C   30 mL at 12/25/19 1511  . aspirin tablet 325 mg  325 mg Oral Daily Delray Alt,  PA-C   325 mg at 12/27/19 [id number]  . ceFAZolin (ANCEF) IVPB 2g/100 mL premix  2 g Intravenous Q8H Patrecia Pace A, PA-C 200 mL/hr at 12/27/19 0644 2 g at 12/27/19 0644  . Chlorhexidine Gluconate Cloth 2 % PADS 6 each  6 each Topical Daily [place] Peifer, Thomasene Lot, MD   6 each at 12/27/19 0934  . cloBAZam (ONFI) tablet 20 mg  20 mg Oral QHS Patrecia Pace A, PA-C   20 mg at 12/26/19 2156  . diphenhydrAMINE (BENADRYL) 12.5 MG/5ML elixir 25 mg  25 mg Oral Q4H PRN Delray Alt, PA-C      . diphenhydrAMINE (BENADRYL) injection 25 mg  25 mg Intravenous Once Yacobi, Sarah A, PA-C      . docusate sodium (COLACE) capsule 100 mg  100 mg Oral BID Patrecia Pace A, PA-C   100 mg at 12/27/19 0933  . feeding supplement (ENSURE ENLIVE) (ENSURE ENLIVE) liquid 237 mL  237 mL Oral BID BM Patrecia Pace A, PA-C   237 mL at 12/27/19 0940  . furosemide (LASIX) injection 20 mg  20 mg Intravenous Once Yacobi, Sarah A, PA-C      . gabapentin (NEURONTIN) capsule 100 mg  100 mg Oral TID Patrecia Pace A, PA-C   100 mg at 12/27/19 0933  . lamoTRIgine (LAMICTAL) tablet 150 mg  150 mg Oral BID Patrecia Pace A, PA-C   150 mg at 12/27/19 [id number]  . levETIRAcetam (KEPPRA) tablet 750 mg  750 mg Oral BID Patrecia Pace A, PA-C   750 mg at 12/27/19 [id number]  . magnesium citrate solution 1 Bottle  1 Bottle Oral Once PRN Delray Alt, PA-C      . methocarbamol (ROBAXIN) tablet 500 mg  500 mg Oral Q6H PRN Delray Alt, PA-C   500 mg at 12/25/19 [id number]   Or  . methocarbamol (ROBAXIN) 500 mg in dextrose 5 % 50 mL IVPB  500 mg Intravenous Q6H PRN Delray Alt, PA-C      . metoCLOPramide (REGLAN) tablet 5-10 mg  5-10 mg Oral Q8H PRN Delray Alt, PA-C       Or  . metoCLOPramide (REGLAN) injection 5-10 mg  5-10 mg Intravenous Q8H PRN Delray Alt, PA-C      . morphine 2 MG/ML injection 1-2 mg  1-2 mg Intravenous Q2H PRN Delray Alt, PA-C      . multivitamin with minerals tablet 1 tablet  1 tablet Oral Daily Delray Alt, PA-C   1 tablet at  12/27/19 0933  . mupirocin ointment (BACTROBAN) 2 % 1 application  1 application Nasal BID Kavitha Lansdale, Thomasene Lot, MD   1 application at [id number] [phone]  . ondansetron (ZOFRAN) tablet 4 mg  4 mg Oral Q6H PRN Patrecia Pace A, PA-C       Or  . ondansetron [facility]) injection 4 mg  4 mg Intravenous Q6H PRN Patrecia Pace A, PA-C      . oxyCODONE (Oxy IR/ROXICODONE) immediate release tablet 5-10 mg  5-10 mg Oral Q4H PRN Delray Alt, PA-C  10 mg at 12/27/19 0933  . pantoprazole (PROTONIX) EC tablet 40 mg  40 mg Oral Daily Patrecia Pace A, PA-C   40 mg at 12/27/19 0933  . polyethylene glycol (MIRALAX / GLYCOLAX) packet 17 g  17 g Oral Daily PRN Delray Alt, PA-C      . sertraline (ZOLOFT) tablet 100 mg  100 mg Oral Daily Patrecia Pace A, PA-C   100 mg at 12/27/19 0933  . sorbitol 70 % solution 30 mL  30 mL Oral Daily PRN Delray Alt, PA-C      . zonisamide (ZONEGRAN) capsule 400 mg  400 mg Oral Q supper Delray Alt, PA-C   400 mg at 12/26/19 1742     Discharge Medications: Please see discharge summary for a list of discharge medications.  Relevant Imaging Results:  Relevant Lab Results:   Additional Information SS # [id number]  Curlene Labrum, RN

## 2019-12-27 NOTE — Progress Notes (Signed)
Occupational Therapy Treatment Patient Details Name: Cindy Mcclure MRN: [id number] DOB: [birth year] Today's Date: 12/27/2019    History of present illness 54 yo female s/p fall from ladder with ORIF L bicondylar tibial plateau fx, L fibial fx R rib fx  and R  fibula fx PMH chronic back pain, DDD, partial seizure, panic attacks   OT comments  Pt asleep upon arrival, required max cues to remain awake, pt keeping eyes shut majority of session. Pt would state she was awake and would answer questions but keep eyes shut. Pt required minA to progress into long sitting and modA+2 for anterior/posterior transfer from EOB to recliner. She required setupA for completion of grooming while seated in recliner. Pt will continue to benefit from skilled OT services to maximize safety and independence with ADL/IADL and functional mobility. Will continue to follow acutely and progress as tolerated.    Follow Up Recommendations  SNF(uncertain family (A))    Equipment Recommendations  3 in 1 bedside commode    Recommendations for Other Services Other (comment)(very flat affect question if need for chaplain or psych )    Precautions / Restrictions Precautions Precautions: Fall Required Braces or Orthoses: Other Brace Other Brace: LLE hinge knee brace Restrictions Weight Bearing Restrictions: Yes RLE Weight Bearing: Non weight bearing LLE Weight Bearing: Non weight bearing       Mobility Bed Mobility Overal bed mobility: Needs Assistance Bed Mobility: Supine to Sit     Supine to sit: Min assist;HOB elevated     General bed mobility comments: hand held assistance with HOB elevated to progress into long sitting;assist for BLE management with progression for anterior/posterior transfer  Transfers Overall transfer level: Needs assistance Equipment used: None Transfers: Comptroller transfers: Mod assist;+2 physical assistance;+2 safety/equipment    General transfer comment: modA+2 for anterior posterior transfer from EOB to recliner;pt assisting with pushing through BUE    Balance Overall balance assessment: Needs assistance Sitting-balance support: Feet unsupported Sitting balance-Leahy Scale: Good                                     ADL either performed or assessed with clinical judgement   ADL Overall ADL's : Needs assistance/impaired Eating/Feeding: Set up;Sitting   Grooming: Set up;Sitting;Oral care;Wash/dry face Grooming Details (indicate cue type and reason): while sitting in recliner                 Toilet Transfer: Moderate assistance;+2 for physical assistance;+2 for safety/equipment;Anterior/posterior Toilet Transfer Details (indicate cue type and reason): simulated from EOB to recliner Toileting- Clothing Manipulation and Hygiene: Moderate assistance;Sitting/lateral lean         General ADL Comments: pt required modA+2 for anterior/posterior transfer,     Vision   Vision Assessment?: No apparent visual deficits   Perception     Praxis      Cognition Arousal/Alertness: Lethargic Behavior During Therapy: Flat affect Overall Cognitive Status: Difficult to assess                                 General Comments: flat affect, requires increased time to process information        Exercises     Shoulder Instructions       General Comments  Pt would benefit from night splint, PTA communicated this with PA prior  to start of session, PA put in order for night splint to assist with left foot dorsiflexion.     Pertinent Vitals/ Pain       Pain Assessment: Faces Faces Pain Scale: Hurts little more Pain Location: L leg Pain Descriptors / Indicators: Grimacing;Guarding Pain Intervention(s): Monitored during session;Limited activity within patient's tolerance  Home Living                                          Prior Functioning/Environment               Frequency  Min 3X/week        Progress Toward Goals  OT Goals(current goals can now be found in the care plan section)  Progress towards OT goals: Progressing toward goals  Acute Rehab OT Goals Patient Stated Goal: none stated OT Goal Formulation: With patient Time For Goal Achievement: 01/07/20 Potential to Achieve Goals: Good ADL Goals Pt Will Perform Lower Body Dressing: with min guard assist;bed level Pt Will Transfer to Toilet: with min guard assist;bedside commode;with transfer board(drop arm) Additional ADL Goal #1: Pt will complete don doff splint mod I Additional ADL Goal #2: Pt will demonstrate L LE edema management with splint on 2 pillows mod I Additional ADL Goal #3: Pt will complete bed mobility independently  Plan Discharge plan remains appropriate    Co-evaluation    PT/OT/SLP Co-Evaluation/Treatment: Yes Reason for Co-Treatment: For patient/therapist safety;To address functional/ADL transfers          AM-PAC OT "6 Clicks" Daily Activity     Outcome Measure   Help from another person eating meals?: A Little Help from another person taking care of personal grooming?: A Little Help from another person toileting, which includes using toliet, bedpan, or urinal?: A Lot Help from another person bathing (including washing, rinsing, drying)?: A Lot Help from another person to put on and taking off regular upper body clothing?: A Little Help from another person to put on and taking off regular lower body clothing?: A Lot 6 Click Score: 15    End of Session Equipment Utilized During Treatment: Gait belt  OT Visit Diagnosis: Unsteadiness on feet (R26.81);Muscle weakness (generalized) (M62.81);Pain Pain - Right/Left: Left Pain - part of body: Leg   Activity Tolerance Patient tolerated treatment well   Patient Left in chair;with call bell/phone within reach;with chair alarm set;with family/visitor present   Nurse Communication Mobility  status;Precautions        Time: [id number] OT Time Calculation (min): 24 min  Charges: OT General Charges $OT Visit: 1 Visit OT Treatments $Self Care/Home Management : 8-22 mins  Helene Kelp OTR/L Acute Rehabilitation Services Office: [phone]    Wyn Forster 12/27/2019, 12:42 PM

## 2019-12-27 NOTE — Progress Notes (Signed)
Patient just said she has been having chest pain over last couple days but also said that a doctor told her she has angina, this is the firs time she has said anythign to me.  Cindy Mcclure notified of patient's complaint.  Awaiting further orders.  MD aware of chest pain complaint and informed patient has been worked up for chest pain and diagnosed for angina.  Will pass on complaints of chest pain to report and need for further work up with cardiology if continues or worsens.

## 2019-12-27 NOTE — Progress Notes (Signed)
Orthopedic Tech Progress Note Patient Details:  Cindy Mcclure [birth year] [id number] Called in order to [facility] for a NIGHT SPLINT Patient ID: Cindy Mcclure, female   DOB: 03-29-1966, 54 y.o.   MRN: [id number]   Janit Pagan 12/27/2019, 11:17 AM

## 2019-12-27 NOTE — Progress Notes (Signed)
Physical Therapy Treatment Patient Details Name: Cindy Mcclure MRN: [id number] DOB: 20-Jan-1966 Today's Date: 12/27/2019    History of Present Illness 54 yo female s/p fall from ladder with ORIF L bicondylar tibial plateau fx, L fibial fx R rib fx  and R  fibula fx PMH chronic back pain, DDD, partial seizure, panic attacks    PT Comments    Pt supine in bed on arrival.  Pt required assistance to move to edge of bed to prepare for AP scoot into recliner.  She required more assistance with LLE.  Continue to recommend rehab in a post acute setting.     Follow Up Recommendations  SNF;Supervision/Assistance - 24 hour     Equipment Recommendations  Wheelchair (measurements PT);Wheelchair cushion (measurements PT);Other (comment)    Recommendations for Other Services       Precautions / Restrictions Precautions Precautions: Fall Precaution Comments: LLE ex fix Required Braces or Orthoses: Other Brace Other Brace: LLE hinge knee brace Restrictions Weight Bearing Restrictions: Yes RLE Weight Bearing: Non weight bearing    Mobility  Bed Mobility Overal bed mobility: Needs Assistance Bed Mobility: Supine to Sit;Sit to Supine     Supine to sit: Min assist;HOB elevated     General bed mobility comments: hand held assistance with HOB elevated to progress into long sitting;assist for BLE management with progression for anterior/posterior transfer  Transfers Overall transfer level: Needs assistance Equipment used: None Transfers: Comptroller transfers: Mod assist;+2 physical assistance;+2 safety/equipment   General transfer comment: modA+2 for anterior posterior transfer from EOB to recliner;pt assisting with pushing through BUE  Ambulation/Gait                 Stairs             Wheelchair Mobility    Modified Rankin (Stroke Patients Only)       Balance Overall balance assessment: Needs  assistance Sitting-balance support: Feet unsupported Sitting balance-Leahy Scale: Good(in long sitting.)                                      Cognition Arousal/Alertness: Lethargic;Suspect due to medications Behavior During Therapy: Flat affect Overall Cognitive Status: Difficult to assess                                 General Comments: flat affect, requires increased time to process information      Exercises General Exercises - Lower Extremity Ankle Circles/Pumps: Left;10 reps;Supine Heel Slides: AROM;Both;10 reps;Supine Hip ABduction/ADduction: AROM;Both;10 reps;Supine    General Comments        Pertinent Vitals/Pain Pain Assessment: Faces Faces Pain Scale: Hurts little more Pain Location: L leg Pain Descriptors / Indicators: Grimacing;Guarding Pain Intervention(s): Monitored during session;Repositioned    Home Living                      Prior Function            PT Goals (current goals can now be found in the care plan section) Acute Rehab PT Goals Patient Stated Goal: none stated Potential to Achieve Goals: Good Progress towards PT goals: Progressing toward goals    Frequency    Min 5X/week      PT Plan Current plan remains appropriate    Co-evaluation PT/OT/SLP Co-Evaluation/Treatment: Yes  Reason for Co-Treatment: Complexity of the patient's impairments (multi-system involvement) PT goals addressed during session: Mobility/safety with mobility OT goals addressed during session: ADL's and self-care      AM-PAC PT "6 Clicks" Mobility   Outcome Measure  Help needed turning from your back to your side while in a flat bed without using bedrails?: A Little Help needed moving from lying on your back to sitting on the side of a flat bed without using bedrails?: A Little Help needed moving to and from a bed to a chair (including a wheelchair)?: A Lot Help needed standing up from a chair using your arms (e.g.,  wheelchair or bedside chair)?: Total Help needed to walk in hospital room?: Total Help needed climbing 3-5 steps with a railing? : Total 6 Click Score: 11    End of Session Equipment Utilized During Treatment: Gait belt Activity Tolerance: Patient tolerated treatment well Patient left: with call bell/phone within reach;in bed;with bed alarm set Nurse Communication: Mobility status PT Visit Diagnosis: Pain;Other abnormalities of gait and mobility (R26.89) Pain - Right/Left: Left Pain - part of body: Leg     Time: [id number] PT Time Calculation (min) (ACUTE ONLY): 24 min  Charges:  $Therapeutic Activity: 8-22 mins                     Erasmo Leventhal , PTA Acute Rehabilitation Services Pager [phone] Office [phone]     Valyncia Wiens Eli Hose 12/27/2019, 6:05 PM

## 2019-12-27 NOTE — Plan of Care (Signed)

## 2019-12-28 LAB — CBC
HCT: 23.1 % — ABNORMAL LOW (ref 36.0–46.0)
Hemoglobin: 7.2 g/dL — ABNORMAL LOW (ref 12.0–15.0)
MCH: 30.3 pg (ref 26.0–34.0)
MCHC: 31.2 g/dL (ref 30.0–36.0)
MCV: 97.1 fL (ref 80.0–100.0)
Platelets: 332 10*3/uL (ref 150–400)
RBC: 2.38 MIL/uL — ABNORMAL LOW (ref 3.87–5.11)
RDW: 14.4 % (ref 11.5–15.5)
WBC: 12.8 10*3/uL — ABNORMAL HIGH (ref 4.0–10.5)
nRBC: 0.3 % — ABNORMAL HIGH (ref 0.0–0.2)

## 2019-12-28 LAB — VITAMIN D 25 HYDROXY (VIT D DEFICIENCY, FRACTURES): Vit D, 25-Hydroxy: 35.4 ng/mL (ref 30–100)

## 2019-12-28 MED ORDER — ENOXAPARIN SODIUM 40 MG/0.4ML ~~LOC~~ SOLN
40.0000 mg | SUBCUTANEOUS | Status: DC
  Administered 2019-12-29 – 2020-01-04 (×7): 40 mg via SUBCUTANEOUS
  Filled 2019-12-28 (×7): qty 0.4

## 2019-12-28 MED ORDER — ENOXAPARIN SODIUM 40 MG/0.4ML ~~LOC~~ SOLN
40.0000 mg | SUBCUTANEOUS | 0 refills | Status: DC
Start: 2019-12-28 — End: 2020-10-24

## 2019-12-28 MED ORDER — OXYCODONE HCL 5 MG PO TABS: 5.0000 mg | ORAL_TABLET | Freq: Four times a day (QID) | ORAL | 0 refills | Status: DC | PRN

## 2019-12-28 MED ORDER — GABAPENTIN 100 MG PO CAPS: 100.0000 mg | ORAL_CAPSULE | Freq: Three times a day (TID) | ORAL | 0 refills | Status: DC

## 2019-12-28 MED ORDER — DOCUSATE SODIUM 100 MG PO CAPS: 100.0000 mg | ORAL_CAPSULE | Freq: Two times a day (BID) | ORAL | 0 refills | Status: DC

## 2019-12-28 MED ORDER — ASPIRIN EC 81 MG PO TBEC
81.0000 mg | DELAYED_RELEASE_TABLET | Freq: Every day | ORAL | 0 refills | Status: DC
Start: 2019-12-28 — End: 2019-12-28

## 2019-12-28 MED ORDER — ENOXAPARIN SODIUM 40 MG/0.4ML ~~LOC~~ SOLN
40.0000 mg | SUBCUTANEOUS | 0 refills | Status: DC
Start: 2019-12-28 — End: 2019-12-28

## 2019-12-28 MED ORDER — ASPIRIN EC 81 MG PO TBEC
81.0000 mg | DELAYED_RELEASE_TABLET | Freq: Every day | ORAL | 0 refills | Status: AC
Start: 2019-12-28 — End: 2020-01-27

## 2019-12-28 MED ORDER — METHOCARBAMOL 500 MG PO TABS: 500.0000 mg | ORAL_TABLET | Freq: Four times a day (QID) | ORAL | 0 refills | Status: DC | PRN

## 2019-12-28 MED ORDER — BISACODYL 10 MG RE SUPP
10.0000 mg | Freq: Every day | RECTAL | Status: DC | PRN
  Administered 2019-12-28: 10 mg via RECTAL
  Filled 2019-12-28: qty 1

## 2019-12-28 NOTE — Progress Notes (Signed)
   12/28/19 2029  Assess: MEWS Score  Temp 98.2 F (36.8 C)  BP 113/65  Pulse Rate (!) 106  Resp 18  SpO2 98 %  O2 Device Room Air  Assess: MEWS Score  MEWS Temp 0  MEWS Systolic 0  MEWS Pulse 1  MEWS RR 0  MEWS LOC 0  MEWS Score 1  MEWS Score Color Green  Assess: if the MEWS score is Yellow or Red  Were vital signs taken at a resting state? Yes  Focused Assessment Documented focused assessment  Early Detection of Sepsis Score *See Row Information* Low  MEWS guidelines implemented *See Row Information* Yes  Treat  MEWS Interventions Administered prn meds/treatments  Take Vital Signs  Increase Vital Sign Frequency  Yellow: Q 2hr X 2 then Q 4hr X 2, if remains yellow, continue Q 4hrs  Escalate  MEWS: Escalate Yellow: discuss with charge nurse/RN and consider discussing with provider and RRT  Notify: Charge Nurse/RN  Name of Charge Nurse/RN Notified Blanch Media  Date Charge Nurse/RN Notified 12/28/19  Time Charge Nurse/RN Notified 2000  Notify: Provider  Provider Name/Title Mariel Sleet  Date Provider Notified 12/28/19  Time Provider Notified 2010  Notification Type Call  Notification Reason Change in status  Response No new orders  Date of Provider Response 12/28/19  Time of Provider Response 2010  Notify: Rapid Response  Name of Rapid Response RN Notified n/a  Document  Patient Outcome Stabilized after interventions  Progress note created (see row info) Yes

## 2019-12-28 NOTE — Progress Notes (Signed)
Orthopaedic Trauma Progress Note  S: Doing okay, sleepy this morning. Pain better controlled after change in medication yesterday.Tolerating diet and fluids. Denies any chest pain, SOB, nausea, vomiting, abdominal pain. Last BM 3 days ago. Patient's mother at bedside  O:  Vitals:   12/28/19 0500 12/28/19 0740  BP: 111/61 115/70  Pulse: 99 (!) 108  Resp:  20  Temp: 98 F (36.7 C) 97.7 F (36.5 C)  SpO2: 95% 97%    General  - Sitting up in bed,  No acute distress Respiratory -  No increased work of breathing.  Right lower extremity - Short leg splint in place.  Nontender above splint.  Sensation intact to light touch of the toes.  Knee motion without discomfort.  Able to wiggle her toes.  Extremity well perfused.  Brisk cap refill Left lower extremity - Hinge knee brace in place.  Dressing changed, incisions clean, dry, intact.  Lower leg with significant bruising. Compartments soft and compressible. Able to wiggle toes.  Ankle dorsiflexion/plantarflexion is intact. +DP pulse  Imaging: Stable post op imaging.   Labs:  Results for orders placed or performed during the hospital encounter of 12/22/19 (from the past 24 hour(s))  CBC     Status: Abnormal   Collection Time: 12/27/19  3:18 PM  Result Value Ref Range   WBC 12.9 (H) 4.0 - 10.5 K/uL   RBC 2.36 (L) 3.87 - 5.11 MIL/uL   Hemoglobin 7.2 (L) 12.0 - 15.0 g/dL   HCT 22.8 (L) 36.0 - 46.0 %   MCV 96.6 80.0 - 100.0 fL   MCH 30.5 26.0 - 34.0 pg   MCHC 31.6 30.0 - 36.0 g/dL   RDW 14.0 11.5 - 15.5 %   Platelets 300 150 - 400 K/uL   nRBC 0.3 (H) 0.0 - 0.2 %  CBC     Status: Abnormal   Collection Time: 12/28/19 11:32 AM  Result Value Ref Range   WBC 12.8 (H) 4.0 - 10.5 K/uL   RBC 2.38 (L) 3.87 - 5.11 MIL/uL   Hemoglobin 7.2 (L) 12.0 - 15.0 g/dL   HCT 23.1 (L) 36.0 - 46.0 %   MCV 97.1 80.0 - 100.0 fL   MCH 30.3 26.0 - 34.0 pg   MCHC 31.2 30.0 - 36.0 g/dL   RDW 14.4 11.5 - 15.5 %   Platelets 332 150 - 400 K/uL   nRBC 0.3 (H) 0.0  - 0.2 %    Assessment: 54 year old female s/p fall from ladder, 2 Days Post-Op   Injuries: 1.  Left bicondylar tibial plateau fracture s/p removal of external fixator and ORIF 2.  Right Sanders to calcaneus fracture s/p ORIF 3.  Right distal fibular avulsion fracture s/p nonoperative management  Weightbearing: NWB BLE  Insicional and dressing care: Leave splint in place RLE.  Changed LLE dressing today, continue to change PRN  Orthopedic device(s): Splint RLE, hinged knee brace LLE  Okay to unlock hinged knee brace on left lower extremity for gentle range of motion.  Locked in full extension at night.   CV/Blood loss: Acute blood loss anemia, Hgb stable at 7.2 this morning. Hemodynamically stable.   Pain management:  1. Tylenol 650 mg q 6 hours scheduled 2. Robaxin 500 mg q 6 hours PRN 3. Oxycodone 5-10 mg q 4 hours PRN 4. Neurontin 100 mg TID 5. Morphine 1-2 mg q  hours PRN  VTE prophylaxis: ASA 325 mg. Plan to start Lovenox tomorrow.   ID:  Ancef 2gm post op completed  Foley/Lines:  No foley, KVO IVFs  Medical co-morbidities: Seizure disorder  Impediments to Fracture Healing: Polytrauma.  Vitamin D level looks okay at 35, no need for supplementation at this time.   Dispo: Up with therapies as tolerated, PT/OT recommending CIR.   Follow - up plan: 2 weeks  Contact information:  Katha Hamming MD, Patrecia Pace PA-C   [facility] [phone] (office) [email or web address]

## 2019-12-28 NOTE — Progress Notes (Signed)
   12/28/19 1447  Assess: MEWS Score  Temp 97.9 F (36.6 C)  BP (!) 101/56  Pulse Rate (!) 112  Resp 20  SpO2 99 %  O2 Device Room Air  Assess: MEWS Score  MEWS Temp 0  MEWS Systolic 0  MEWS Pulse 2  MEWS RR 0  MEWS LOC 0  MEWS Score 2  MEWS Score Color Yellow  Assess: if the MEWS score is Yellow or Red  Were vital signs taken at a resting state? Yes  Focused Assessment Documented focused assessment  Early Detection of Sepsis Score *See Row Information* Low  MEWS guidelines implemented *See Row Information* No, vital signs rechecked  Treat  MEWS Interventions Administered prn meds/treatments  Escalate  MEWS: Escalate Yellow: discuss with charge nurse/RN and consider discussing with provider and RRT  Notify: Charge Nurse/RN  Name of Charge Nurse/RN Notified  (Lexi)  Date Charge Nurse/RN Notified 12/28/19  Time Charge Nurse/RN Notified 1517  Will recheck vitals and complete focus assessment.

## 2019-12-28 NOTE — Progress Notes (Addendum)
Still awaiting ofEKG's results, Charge nurse/ADON aware that EKG hasnt been done yet.. Pt remains alert/oriented, anxious/irritable at times in no apparent distress. Denies any chestpain/palpitations/ SOB., and prn dulcolax given as ordered.. Report given to night shift nurse to call PA of EKG results. Pt has been closely monitored with no complaints voiced.

## 2019-12-28 NOTE — Progress Notes (Signed)
PT TREATMENT NOTE  Pt seen for additional session to assist RN with transfer back to chair. Pt with more difficulty, requiring two person moderate assist for anterior to posterior transfer method, going slightly uphill. Will continue to practice AP versus slide board transfer, ROM/strengthening exercises and wheelchair mobility as tolerated.    Cindy Mcclure, PT, DPT Acute Rehabilitation Services Pager [phone] Office [phone]     12/28/19 1554  PT Visit Information  Last PT Received On 12/28/19  Assistance Needed +2  History of Present Illness 54 yo female s/p fall from ladder with ORIF L bicondylar tibial plateau fx, L fibial fx R rib fx  and R  fibula fx PMH chronic back pain, DDD, partial seizure, panic attacks  Subjective Data  Patient Stated Goal none stated  Precautions  Precautions Fall  Required Braces or Orthoses Other Brace  Other Brace LLE hinge knee brace  Restrictions  Weight Bearing Restrictions Yes  RLE Weight Bearing NWB  LLE Weight Bearing NWB  Pain Assessment  Pain Assessment Faces  Faces Pain Scale 6  Pain Location L leg  Pain Descriptors / Indicators Grimacing;Guarding  Pain Intervention(s) Monitored during session  Cognition  Arousal/Alertness Awake/alert  Behavior During Therapy Flat affect  Overall Cognitive Status Difficult to assess  General Comments flat affect, requires increased time to process information  Bed Mobility  Overal bed mobility Needs Assistance  Bed Mobility Sit to Supine  Supine to sit Mod assist;+2 for physical assistance  General bed mobility comments Assist to progress from long sitting to supine with use of bed pad to guide hips, BLE management  Transfers  Overall transfer level Needs assistance  Equipment used None  Transfers Anterior-Posterior Transfer  Anterior-Posterior transfers Mod assist;+2 physical assistance  General transfer comment ModA + 2 for anterior-posterior transfer from  bed to chair with use of  bed pad to assist with reciprocal scooting.  Balance  Overall balance assessment Needs assistance  Sitting-balance support Feet unsupported  Sitting balance-Leahy Scale Good  PT - End of Session  Activity Tolerance Patient tolerated treatment well  Patient left with call bell/phone within reach;in bed;with bed alarm set  Nurse Communication Mobility status   PT - Assessment/Plan  PT Plan Current plan remains appropriate  PT Visit Diagnosis Pain;Other abnormalities of gait and mobility (R26.89)  Pain - Right/Left Left  Pain - part of body Leg  PT Frequency (ACUTE ONLY) Min 5X/week  Follow Up Recommendations SNF;Supervision/Assistance - 24 hour  PT equipment Wheelchair (measurements PT);Wheelchair cushion (measurements PT);Other (comment) (slide board, drop arm 3 in 1)  AM-PAC PT "6 Clicks" Mobility Outcome Measure (Version 2)  Help needed turning from your back to your side while in a flat bed without using bedrails? 3  Help needed moving from lying on your back to sitting on the side of a flat bed without using bedrails? 2  Help needed moving to and from a bed to a chair (including a wheelchair)? 2  Help needed standing up from a chair using your arms (e.g., wheelchair or bedside chair)? 1  Help needed to walk in hospital room? 1  Help needed climbing 3-5 steps with a railing?  1  6 Click Score 10  Consider Recommendation of Discharge To: CIR/SNF/LTACH  Acute Rehab PT Goals  Potential to Achieve Goals Good  PT Time Calculation  PT Start Time (ACUTE ONLY) 1530  PT Stop Time (ACUTE ONLY) 1539  PT Time Calculation (min) (ACUTE ONLY) 9 min  PT General Charges  $$ ACUTE  PT VISIT 1 Visit  PT Treatments  $Therapeutic Activity 8-22 mins

## 2019-12-28 NOTE — Progress Notes (Addendum)
Received a return call from PA, Aaron Edelman and received orders for an EKG stat and PRN dulcolax suppository.And to inform of EKG results.

## 2019-12-28 NOTE — Progress Notes (Signed)
Physical Therapy Treatment Patient Details Name: Cindy Mcclure MRN: [id number] DOB: 07/07/1966 Today's Date: 12/28/2019    History of Present Illness 54 yo female s/p fall from ladder with ORIF L bicondylar tibial plateau fx, L fibial fx R rib fx  and R  fibula fx PMH chronic back pain, DDD, partial seizure, panic attacks    PT Comments    Pt progressing slowly towards her physical therapy goals. Session initiated with gentle ROM of left knee with hinged brace donned. Pt able to assist more with anterior to posterior transfer from bed to chair by using arms to perform reciprocal scooting. Requiring increased effort/time. Continue to recommend post acute rehab for transfer training and strengthening.    Follow Up Recommendations  SNF;Supervision/Assistance - 24 hour     Equipment Recommendations  Wheelchair (measurements PT);Wheelchair cushion (measurements PT);Other (comment)(slide board, drop arm 3 in 1)    Recommendations for Other Services       Precautions / Restrictions Precautions Precautions: Fall Required Braces or Orthoses: Other Brace Other Brace: LLE hinge knee brace Restrictions Weight Bearing Restrictions: Yes RLE Weight Bearing: Non weight bearing LLE Weight Bearing: Non weight bearing    Mobility  Bed Mobility Overal bed mobility: Needs Assistance Bed Mobility: Supine to Sit     Supine to sit: Mod assist;+2 for safety/equipment     General bed mobility comments: Assist for trunk to progress into long sitting and BLE management for progression to anterior/posterior transfer. Use of bed pads to guide hips.  Transfers Overall transfer level: Needs assistance Equipment used: None Transfers: Comptroller transfers: Mod assist;+2 safety/equipment   General transfer comment: ModA (+2 safety) for anterior posterior transfer from bed to chair. Pt able to facilitate by using arms for reciprocal scooting    Ambulation/Gait                 Stairs             Wheelchair Mobility    Modified Rankin (Stroke Patients Only)       Balance Overall balance assessment: Needs assistance Sitting-balance support: Feet unsupported Sitting balance-Leahy Scale: Good                                      Cognition Arousal/Alertness: Awake/alert Behavior During Therapy: Flat affect Overall Cognitive Status: Difficult to assess                                 General Comments: flat affect, requires increased time to process information      Exercises General Exercises - Lower Extremity Heel Slides: AAROM;Left;10 reps;Supine Hip ABduction/ADduction: AAROM;Left;10 reps;Supine    General Comments        Pertinent Vitals/Pain Pain Assessment: Faces Faces Pain Scale: Hurts little more Pain Location: L leg Pain Descriptors / Indicators: Grimacing;Guarding Pain Intervention(s): Monitored during session    Home Living                      Prior Function            PT Goals (current goals can now be found in the care plan section) Acute Rehab PT Goals Patient Stated Goal: none stated Potential to Achieve Goals: Good    Frequency    Min 5X/week  PT Plan Current plan remains appropriate    Co-evaluation              AM-PAC PT "6 Clicks" Mobility   Outcome Measure  Help needed turning from your back to your side while in a flat bed without using bedrails?: A Little Help needed moving from lying on your back to sitting on the side of a flat bed without using bedrails?: A Lot Help needed moving to and from a bed to a chair (including a wheelchair)?: A Lot Help needed standing up from a chair using your arms (e.g., wheelchair or bedside chair)?: Total Help needed to walk in hospital room?: Total Help needed climbing 3-5 steps with a railing? : Total 6 Click Score: 10    End of Session   Activity Tolerance:  Patient tolerated treatment well Patient left: in chair;with call bell/phone within reach;with chair alarm set;with family/visitor present Nurse Communication: Mobility status PT Visit Diagnosis: Pain;Other abnormalities of gait and mobility (R26.89) Pain - Right/Left: Left Pain - part of body: Leg     Time: [id number] PT Time Calculation (min) (ACUTE ONLY): 26 min  Charges:  $Therapeutic Activity: 23-37 mins                       Wyona Almas, PT, DPT Acute Rehabilitation Services Pager [phone] Office [phone]    Deno Etienne 12/28/2019, 2:02 PM

## 2019-12-28 NOTE — Progress Notes (Addendum)
Received report from staff nurse that pt has a mews yellow d/t HR in 116;s, seen pt in room alert/oriented in no acute distress. Denies any SOB/chest pain/palpitations. PA/MD paged.still awaiting a response.

## 2019-12-28 NOTE — Progress Notes (Signed)
   12/28/19 1447  Assess: MEWS Score  Temp 97.9 F (36.6 C)  BP (!) 101/56  Pulse Rate (!) 112  Resp 20  SpO2 99 %  O2 Device Room Air  Assess: MEWS Score  MEWS Temp 0  MEWS Systolic 0  MEWS Pulse 2  MEWS RR 0  MEWS LOC 0  MEWS Score 2  MEWS Score Color Yellow  Assess: if the MEWS score is Yellow or Red  Were vital signs taken at a resting state? Yes  Focused Assessment Documented focused assessment  Early Detection of Sepsis Score *See Row Information* Low  MEWS guidelines implemented *See Row Information* Yes  Treat  MEWS Interventions Administered prn meds/treatments  Take Vital Signs  Increase Vital Sign Frequency  Yellow: Q 2hr X 2 then Q 4hr X 2, if remains yellow, continue Q 4hrs  Escalate  MEWS: Escalate Yellow: discuss with charge nurse/RN and consider discussing with provider and RRT  Notify: Charge Nurse/RN  Name of Charge Nurse/RN Notified Lexi (Lexi)  Date Charge Nurse/RN Notified 12/28/19  Time Charge Nurse/RN Notified 1517  Notify: Provider  Provider Name/Title  (N/A)  Notify: Rapid Response  Name of Rapid Response RN Notified N/A

## 2019-12-29 LAB — CBC
HCT: 23.8 % — ABNORMAL LOW (ref 36.0–46.0)
Hemoglobin: 7.4 g/dL — ABNORMAL LOW (ref 12.0–15.0)
MCH: 30.3 pg (ref 26.0–34.0)
MCHC: 31.1 g/dL (ref 30.0–36.0)
MCV: 97.5 fL (ref 80.0–100.0)
Platelets: 412 10*3/uL — ABNORMAL HIGH (ref 150–400)
RBC: 2.44 MIL/uL — ABNORMAL LOW (ref 3.87–5.11)
RDW: 14.4 % (ref 11.5–15.5)
WBC: 12.5 10*3/uL — ABNORMAL HIGH (ref 4.0–10.5)
nRBC: 0.6 % — ABNORMAL HIGH (ref 0.0–0.2)

## 2019-12-29 MED ORDER — FERROUS SULFATE 325 (65 FE) MG PO TABS
325.0000 mg | ORAL_TABLET | Freq: Every day | ORAL | Status: DC
  Administered 2019-12-29 – 2020-01-04 (×7): 325 mg via ORAL
  Filled 2019-12-29 (×7): qty 1

## 2019-12-29 NOTE — Progress Notes (Signed)
Physical Therapy Treatment Patient Details Name: Cindy Mcclure MRN: [id number] DOB: 1965-11-28 Today's Date: 12/29/2019    History of Present Illness 54 yo female s/p fall from ladder admitted om 12/22/19 with L bicondylar tibial plateau fx, L fibial fx R rib fx  and R  fibula fx. ORIF left tibial plateau, tibial tubercle fracture, ORIF right calcaneus, nonoperative management of right distal fibular avulsion fracture on 12/26/2019.  PMH chronic back pain, DDD, partial seizure, panic attacks.    PT Comments    Pt demonstrates improvement with A-P transfer with increased ability to assist with scooting back in chair with UE and trunk. Pt still requires assistance for BLE support for transfer and remains a high fall risk. Pt able to complete all therex AAROM with visible muscle contraction. Pt reported no increase in pain during session, despite initial anticipation of pain with movement. Will continue to follow acutely until d/c to SNF.   Follow Up Recommendations  SNF;Supervision/Assistance - 24 hour     Equipment Recommendations  Wheelchair (measurements PT);Wheelchair cushion (measurements PT);Other (comment)    Recommendations for Other Services       Precautions / Restrictions Precautions Precautions: Fall Required Braces or Orthoses: Knee Immobilizer - Left Other Brace: LLE hinge knee brace, left night splint for DF Restrictions Weight Bearing Restrictions: Yes RLE Weight Bearing: Non weight bearing LLE Weight Bearing: Non weight bearing    Mobility  Bed Mobility Overal bed mobility: Needs Assistance Bed Mobility: Supine to Sit     Supine to sit: Supervision     General bed mobility comments: Supine to long sit in bed independent  Transfers Overall transfer level: Needs assistance Equipment used: None Transfers: Comptroller transfers: Min assist;+2 safety/equipment   General transfer comment: Min(A) for BLE  assistance and holding chair, Pt able to use core and UE to progress her pelvis and trunk back into chair  Ambulation/Gait                 Stairs             Wheelchair Mobility    Modified Rankin (Stroke Patients Only)       Balance Overall balance assessment: Needs assistance Sitting-balance support: Feet unsupported;No upper extremity supported Sitting balance-Leahy Scale: Good                                      Cognition Arousal/Alertness: Awake/alert Behavior During Therapy: Flat affect Overall Cognitive Status: History of cognitive impairments - at baseline                                 General Comments: flat affect, requires increased time to process information      Exercises Total Joint Exercises Heel Slides: AAROM;Both;10 reps;Supine(Within tolerable range of motion) Hip ABduction/ADduction: AAROM;5 reps;Both;Supine Straight Leg Raises: AAROM;5 reps;Both;Supine    General Comments General comments (skin integrity, edema, etc.): no drainage observed on wound dressings. Adjusted night splint to better support DF.      Pertinent Vitals/Pain Pain Score: 0-No pain Pain Intervention(s): Monitored during session    Home Living                      Prior Function            PT Goals (current  goals can now be found in the care plan section) Acute Rehab PT Goals Patient Stated Goal: none stated Time For Goal Achievement: 01/07/20 Potential to Achieve Goals: Good    Frequency    Min 3X/week      PT Plan Current plan remains appropriate    Co-evaluation              AM-PAC PT "6 Clicks" Mobility   Outcome Measure  Help needed turning from your back to your side while in a flat bed without using bedrails?: A Little Help needed moving from lying on your back to sitting on the side of a flat bed without using bedrails?: A Lot Help needed moving to and from a bed to a chair (including a  wheelchair)?: A Lot Help needed standing up from a chair using your arms (e.g., wheelchair or bedside chair)?: Total Help needed to walk in hospital room?: Total Help needed climbing 3-5 steps with a railing? : Total 6 Click Score: 10    End of Session   Activity Tolerance: Patient tolerated treatment well Patient left: with call bell/phone within reach;in chair;with chair alarm set Nurse Communication: Mobility status PT Visit Diagnosis: Pain;Other abnormalities of gait and mobility (R26.89)     Time: [id number] PT Time Calculation (min) (ACUTE ONLY): 28 min  Charges:  $Therapeutic Exercise: 8-22 mins $Therapeutic Activity: 8-22 mins                     [employer] SPT 12/29/2019   Rolland Porter 12/29/2019, 5:25 PM

## 2019-12-29 NOTE — Plan of Care (Signed)

## 2019-12-29 NOTE — TOC Progression Note (Addendum)
Transition of Care [facility]) - Progression Note    Patient Details  Name: CORRA KAINE MRN: [id number] Date of Birth: 12/13/1965  Transition of Care [facility]) CM/SW [place], RN Phone Number: 12/29/2019, 10:12 AM  Clinical Narrative:    Case management met with the patient and patient's mom and offered choice for SNF placement in the [facility].  Family was given Medicare.gov information regarding facilities and waiting for choice so that I can proceed with bed availability and offers.  12/29/19 1213 - Patient and patient's mother has chosen [facility] in [place] for SNF placement.  I left a message with Army Melia at [facility] to check on bed availability and to start insurance authorization.  [facility] does not have available beds for the coming week for possible admission.  Will continue with SNF placement and family is in agreement.   Expected Discharge Plan and Services  Social Determinants of Health (SDOH) Interventions    Readmission Risk Interventions No flowsheet data found.

## 2019-12-29 NOTE — Progress Notes (Signed)
Nutrition Follow-up  DOCUMENTATION CODES:   Obesity unspecified  INTERVENTION:   -Continue Ensure Enlive po BID, each supplement provides 350 kcal and 20 grams of protein -Continue MVI with minerals daily  NUTRITION DIAGNOSIS:  Increased nutrient needs related to post-op healing as evidenced by estimated needs.  Ongoing  GOAL:   Patient will meet greater than or equal to 90% of their needs  Progressing   MONITOR:   PO intake, Supplement acceptance, Diet advancement, Labs, Weight trends, Skin, I & O's  REASON FOR ASSESSMENT:   Consult Assessment of nutrition requirement/status  ASSESSMENT:   Cindy Mcclure was about 49ft up a ladder when she lost her balance and fell. She landed on her feet then fell backwards. She did not hit her head or black out. She c/o right ankle and left knee pain. She was brought to the ED where x-rays showed a right ankle/calc fx and left tibia plateau fx and orthopedic surgery was consulted.  5/6- s/p PROCEDURE: External fixationleftspanning knee  Reviewed I/O's: +280 ml x 24 hours and +1.1 L x 24 hours  UOP: 200 ml x 24 hours  Spoke with pt and mother at bedside. Pt not very communicative with this RD; reports she is not feeling well and complains of constipation as well as her stomach hurting. Per pt, she has already had a BM today. Per mother, pt suffers from constipation at baseline, but typically has one BM daily with the help of a laxative.   Per pt mother, pt typically grazes on snacks throughout the day, such as peanut butter crackers. She consumed breakfast this morning, but unsure how much she ate. Noted opened, unattempted Ensure supplement at bedside. Meal completion 50-75%.   Per [facility] team notes, plan to discharge to SNF today.   Medications reviewed and include colace  NUTRITION - FOCUSED PHYSICAL EXAM:    Most Recent Value  Orbital Region  No depletion  Upper Arm Region  No depletion  Thoracic and Lumbar Region  No depletion   Buccal Region  No depletion  Temple Region  No depletion  Clavicle Bone Region  No depletion  Clavicle and Acromion Bone Region  No depletion  Scapular Bone Region  No depletion  Dorsal Hand  No depletion  Patellar Region  Unable to assess [external fixator]  Anterior Thigh Region  Unable to assess [external fixator]  Posterior Calf Region  Unable to assess [external fixator]  Edema (RD Assessment)  None  Hair  Reviewed  Eyes  Reviewed  Mouth  Reviewed  Skin  Reviewed  Nails  Reviewed       Diet Order:   Diet Order            Diet regular Room service appropriate? No; Fluid consistency: Thin  Diet effective now              EDUCATION NEEDS:   No education needs have been identified at this time  Skin:  Skin Assessment: Skin Integrity Issues: Skin Integrity Issues:: Incisions Incisions: closed lt leg  Last BM:  12/25/19  Height:   Ht Readings from Last 1 Encounters:  12/22/19 5' (1.524 m)    Weight:   Wt Readings from Last 1 Encounters:  12/22/19 77.1 kg    Ideal Body Weight:  45.5 kg  BMI:  Body mass index is 33.2 kg/m.  Estimated Nutritional Needs:   Kcal:  [id number]  Protein:  95-110 grams  Fluid:  > 1.7 L    Loistine Chance, RD, LDN, CDCES  Registered Dietitian II Certified Diabetes Care and Education Specialist Please refer to [facility] for RD and/or RD on-call/weekend/after hours pager

## 2019-12-29 NOTE — Progress Notes (Signed)
Orthopaedic Trauma Progress Note  S: Doing okay. Has been slightly tachycardic off and on, likely related to pain. Tolerating diet and fluids. Denies any chest pain, SOB, nausea, vomiting, abdominal pain. Had BM yesterday evening.  O:  Vitals:   12/29/19 0430 12/29/19 0846  BP: 109/63 (!) 96/58  Pulse: 97 (!) 109  Resp: 17 18  Temp: 99.1 F (37.3 C) (!) 97.4 F (36.3 C)  SpO2: 93% 95%    General  - Sitting up in bed,  No acute distress Respiratory -  No increased work of breathing.  Right lower extremity - Short leg splint in place.  Nontender above splint.  Sensation intact to light touch of the toes.  Knee motion without discomfort.  Able to wiggle her toes.  Extremity well perfused.  Brisk cap refill Left lower extremity - Hinge knee brace in place.  Dressing clean, dry, intact.  Compartments soft and compressible. Able to wiggle toes.  Ankle dorsiflexion/plantarflexion is intact. +DP pulse  Imaging: Stable post op imaging.   Labs:  Results for orders placed or performed during the hospital encounter of 12/22/19 (from the past 24 hour(s))  CBC     Status: Abnormal   Collection Time: 12/28/19 11:32 AM  Result Value Ref Range   WBC 12.8 (H) 4.0 - 10.5 K/uL   RBC 2.38 (L) 3.87 - 5.11 MIL/uL   Hemoglobin 7.2 (L) 12.0 - 15.0 g/dL   HCT 23.1 (L) 36.0 - 46.0 %   MCV 97.1 80.0 - 100.0 fL   MCH 30.3 26.0 - 34.0 pg   MCHC 31.2 30.0 - 36.0 g/dL   RDW 14.4 11.5 - 15.5 %   Platelets 332 150 - 400 K/uL   nRBC 0.3 (H) 0.0 - 0.2 %  CBC     Status: Abnormal   Collection Time: 12/29/19  7:39 AM  Result Value Ref Range   WBC 12.5 (H) 4.0 - 10.5 K/uL   RBC 2.44 (L) 3.87 - 5.11 MIL/uL   Hemoglobin 7.4 (L) 12.0 - 15.0 g/dL   HCT 23.8 (L) 36.0 - 46.0 %   MCV 97.5 80.0 - 100.0 fL   MCH 30.3 26.0 - 34.0 pg   MCHC 31.1 30.0 - 36.0 g/dL   RDW 14.4 11.5 - 15.5 %   Platelets 412 (H) 150 - 400 K/uL   nRBC 0.6 (H) 0.0 - 0.2 %    Assessment: 54 year old female s/p fall from ladder, 3 Days  Post-Op   Injuries: 1.  Left bicondylar tibial plateau fracture s/p removal of external fixator and ORIF 2.  Right Sanders to calcaneus fracture s/p ORIF 3.  Right distal fibular avulsion fracture s/p nonoperative management  Weightbearing: NWB BLE  Insicional and dressing care: Leave splint in place RLE, continue to change LLE dressing PRN  Orthopedic device(s): Splint RLE, hinged knee brace LLE  Okay to unlock hinged knee brace on left lower extremity for gentle range of motion.  Locked in full extension at night.   CV/Blood loss: Acute blood loss anemia, Hgb stable at 7.4 this morning. Hemodynamically stable.   Pain management:  1. Tylenol 650 mg q 6 hours scheduled 2. Robaxin 500 mg q 6 hours PRN 3. Oxycodone 5-10 mg q 4 hours PRN 4. Neurontin 100 mg TID 5. Morphine 1-2 mg q  hours PRN  VTE prophylaxis: Start Lovenox today   ID:  Ancef 2gm post op completed  Foley/Lines:  No foley, KVO IVFs  Medical co-morbidities: Seizure disorder  Impediments to Fracture Healing:  Polytrauma.  Vitamin D level looks okay at 35, no need for supplementation at this time.   Dispo: Up with therapies as tolerated, PT/OT recommending CIR. Okay for d/c once bed available  Follow - up plan: 2 weeks for repeat x-rays and splint removal  Contact information:  Katha Hamming MD, Patrecia Pace PA-C   [facility] Argabright [facility] [phone] (office) [email or web address]

## 2019-12-30 LAB — CBC
HCT: 21.8 % — ABNORMAL LOW (ref 36.0–46.0)
Hemoglobin: 6.8 g/dL — CL (ref 12.0–15.0)
MCH: 30.4 pg (ref 26.0–34.0)
MCHC: 31.2 g/dL (ref 30.0–36.0)
MCV: 97.3 fL (ref 80.0–100.0)
Platelets: 415 10*3/uL — ABNORMAL HIGH (ref 150–400)
RBC: 2.24 MIL/uL — ABNORMAL LOW (ref 3.87–5.11)
RDW: 14.1 % (ref 11.5–15.5)
WBC: 11.2 10*3/uL — ABNORMAL HIGH (ref 4.0–10.5)
nRBC: 0.5 % — ABNORMAL HIGH (ref 0.0–0.2)

## 2019-12-30 LAB — TYPE AND SCREEN
ABO/RH(D): AB POS
Antibody Screen: NEGATIVE
Unit division: 0
Unit division: 0

## 2019-12-30 LAB — BPAM RBC
Blood Product Expiration Date: 202106042359
Blood Product Expiration Date: 202106042359
Unit Type and Rh: 6200
Unit Type and Rh: 6200

## 2019-12-30 LAB — HEMOGLOBIN AND HEMATOCRIT, BLOOD
HCT: 29.8 % — ABNORMAL LOW (ref 36.0–46.0)
Hemoglobin: 9.5 g/dL — ABNORMAL LOW (ref 12.0–15.0)

## 2019-12-30 LAB — PREPARE RBC (CROSSMATCH)

## 2019-12-30 MED ORDER — SODIUM CHLORIDE 0.9% IV SOLUTION: Freq: Once | INTRAVENOUS | Status: AC

## 2019-12-30 MED ORDER — SODIUM CHLORIDE 0.9 % IV SOLN: INTRAVENOUS | Status: DC

## 2019-12-30 NOTE — Plan of Care (Signed)

## 2019-12-30 NOTE — Progress Notes (Addendum)
Started 2nd unit of PRBC transfusion, vital signs stable, no adverse reactions noted, will continue to monitor.  Pt completed 2nd unit of PRBC, vital signs taken and recorded, no reactions noted, h&h order placed.

## 2019-12-30 NOTE — Care Management (Signed)
Case Management faxed the FL2 and 30-Day note to [facility] after it was signed by Dr. Doreatha Martin in preparation to obtain patient's PASSR # that is currently in review.  Please continue to follow for patient's acceptance and insurance authorization approval at [facility].

## 2019-12-30 NOTE — Plan of Care (Signed)

## 2019-12-30 NOTE — Progress Notes (Addendum)
CRITICAL VALUE ALERT  Critical Value:  Hgb 6.8  Date & Time Notied: 12/30/2019 @ 0412  Provider Notified: Noemi Chapel  Orders Received/Actions taken: 2 units of PRBC   IV infiltrated during blood transfusion, IV team consult STAT, new IV place in R arm. Blood continued. Pt stable, VS stable, no complaints, will continue to monitor patient.

## 2019-12-30 NOTE — Progress Notes (Signed)
Orthopaedic Trauma Progress Note  S: Doing okay, very sleepy this morning but easily arousable.  Hemoglobin 6.8 on morning draw. 2 units PRBCs ordered, 1 unit has been given so far. Denies any chest pain, SOB, nausea, vomiting, lightheadedness, dizziness.   O:  Vitals:   12/30/19 0728 12/30/19 0914  BP: 101/62 102/65  Pulse: 98 96  Resp: 17 15  Temp: 98 F (36.7 C) 98.4 F (36.9 C)  SpO2: 95% 97%    General: Laying in bed comfortably, sleepy but easily arousable.  No acute distress Respiratory: No increased work of breathing.  Right lower extremity: Short leg splint in place.  Nontender above splint.  Sensation intact to light touch of the toes.  Knee motion without discomfort.  Able to wiggle her toes.  Extremity well perfused.  Brisk cap refill Left lower extremity - Hinge knee brace in place.  Dressing clean, dry, intact.  Compartments soft and compressible. Able to wiggle toes.  Ankle dorsiflexion/plantarflexion is intact.  Tolerates about 40 degrees of knee flexion in the hinged brace.  +DP pulse  Imaging: Stable post op imaging.   Labs:  Results for orders placed or performed during the hospital encounter of 12/22/19 (from the past 24 hour(s))  CBC     Status: Abnormal   Collection Time: 12/30/19  2:52 AM  Result Value Ref Range   WBC 11.2 (H) 4.0 - 10.5 K/uL   RBC 2.24 (L) 3.87 - 5.11 MIL/uL   Hemoglobin 6.8 (LL) 12.0 - 15.0 g/dL   HCT 21.8 (L) 36.0 - 46.0 %   MCV 97.3 80.0 - 100.0 fL   MCH 30.4 26.0 - 34.0 pg   MCHC 31.2 30.0 - 36.0 g/dL   RDW 14.1 11.5 - 15.5 %   Platelets 415 (H) 150 - 400 K/uL   nRBC 0.5 (H) 0.0 - 0.2 %  Prepare RBC (crossmatch)     Status: None   Collection Time: 12/30/19  4:25 AM  Result Value Ref Range   Order Confirmation      ORDER PROCESSED BY BLOOD BANK Performed at [facility], [address] [place], [place] [address]   Type and screen [place]     Status: None (Preliminary result)   Collection Time:  12/30/19  4:40 AM  Result Value Ref Range   ABO/RH(D) AB POS    Antibody Screen NEG    Sample Expiration 01/02/2020,2359    Unit Number [id number]    Blood Component Type RED CELLS,LR    Unit division 00    Status of Unit ALLOCATED    Transfusion Status OK TO TRANSFUSE    Crossmatch Result Compatible    Unit Number [id number]    Blood Component Type RED CELLS,LR    Unit division 00    Status of Unit ISSUED    Transfusion Status OK TO TRANSFUSE    Crossmatch Result      Compatible Performed at [facility], [place] [address] [place], [place] [address]     Assessment: 54 year old female s/p fall from ladder, 4 Days Post-Op   Injuries: 1.  Left bicondylar tibial plateau fracture s/p removal of external fixator and ORIF 2.  Right Sanders to calcaneus fracture s/p ORIF 3.  Right distal fibular avulsion fracture s/p nonoperative management  Weightbearing: NWB BLE  Insicional and dressing care: Leave splint in place RLE. okay to leave LLE incision open to air, change dressing PRN  Orthopedic device(s): Splint RLE, hinged knee brace LLE  Okay to unlock hinged knee brace on left lower extremity for gentle range of motion.  Locked in full extension at night.   CV/Blood loss: Acute blood loss anemia, Hgb stable at 6.8 this morning.  2 units PRBCs ordered.  Will post-transfusion H&H  Pain management:  1. Tylenol 650 mg q 6 hours scheduled 2. Robaxin 500 mg q 6 hours PRN 3. Oxycodone 5-10 mg q 4 hours PRN 4. Neurontin 100 mg TID 5. Morphine 1-2 mg q  hours PRN  VTE prophylaxis: Lovenox  ID:  Ancef 2gm post op completed  Foley/Lines:  No foley, KVO IVFs  Medical co-morbidities: Seizure disorder  Impediments to Fracture Healing: Polytrauma.  Vitamin D level looks okay at 35, no need for supplementation at this time.   Dispo: Up with therapies as tolerated, PT/OT recommending SNF.  Patient receiving blood this morning, not ready for discharge today.  Case manager  following and working on placement at [facility].  Follow - up plan: 2 weeks for repeat x-rays and splint removal  Contact information:  Katha Hamming MD, Patrecia Pace PA-C   [facility]pler [facility] [phone] (office) [email or web address]

## 2019-12-31 LAB — BPAM RBC
Blood Product Expiration Date: 202106192359
Blood Product Expiration Date: 202106202359
ISSUE DATE / TIME: 202105140555
ISSUE DATE / TIME: 202105141002
Unit Type and Rh: 8400
Unit Type and Rh: 8400

## 2019-12-31 LAB — CBC
HCT: 29.6 % — ABNORMAL LOW (ref 36.0–46.0)
Hemoglobin: 9.4 g/dL — ABNORMAL LOW (ref 12.0–15.0)
MCH: 29.9 pg (ref 26.0–34.0)
MCHC: 31.8 g/dL (ref 30.0–36.0)
MCV: 94.3 fL (ref 80.0–100.0)
Platelets: 449 10*3/uL — ABNORMAL HIGH (ref 150–400)
RBC: 3.14 MIL/uL — ABNORMAL LOW (ref 3.87–5.11)
RDW: 14.5 % (ref 11.5–15.5)
WBC: 10.4 10*3/uL (ref 4.0–10.5)
nRBC: 0.5 % — ABNORMAL HIGH (ref 0.0–0.2)

## 2019-12-31 LAB — TYPE AND SCREEN
ABO/RH(D): AB POS
Antibody Screen: NEGATIVE
Unit division: 0
Unit division: 0

## 2019-12-31 LAB — BASIC METABOLIC PANEL
Anion gap: 11 (ref 5–15)
BUN: 20 mg/dL (ref 6–20)
CO2: 24 mmol/L (ref 22–32)
Calcium: 8.6 mg/dL — ABNORMAL LOW (ref 8.9–10.3)
Chloride: 105 mmol/L (ref 98–111)
Creatinine, Ser: 0.91 mg/dL (ref 0.44–1.00)
GFR calc Af Amer: >60 mL/min (ref >60)
GFR calc non Af Amer: >60 mL/min (ref >60)
Glucose, Bld: 98 mg/dL (ref 70–99)
Potassium: 4.3 mmol/L (ref 3.5–5.1)
Sodium: 140 mmol/L (ref 135–145)

## 2019-12-31 NOTE — Plan of Care (Signed)

## 2019-12-31 NOTE — Plan of Care (Signed)

## 2019-12-31 NOTE — Progress Notes (Signed)
SPORTS MEDICINE AND JOINT REPLACEMENT  Lara Mulch, MD    Carlyon Shadow, PA-C [place], [place], [place]  [address]                             [phone]   PROGRESS NOTE  Subjective:  negative for Chest Pain  negative for Shortness of Breath  negative for Nausea/Vomiting   negative for Calf Pain  negative for Bowel Movement   Tolerating Diet: yes         Patient reports pain as 3 on 0-10 scale.    Objective: Vital signs in last 24 hours:    Patient Vitals for the past 24 hrs:  BP Temp Temp src Pulse Resp SpO2  12/31/19 0335 107/64 98 F (36.7 C) Oral 85 16 100 %  12/30/19 1946 108/69 97.7 F (36.5 C) Oral 88 18 98 %  12/30/19 1322 (!) 109/57 98.5 F (36.9 C) Oral 82 17 100 %  12/30/19 1232 (!) 101/56 98.9 F (37.2 C) Oral 94 16 100 %  12/30/19 1036 103/67 97.7 F (36.5 C) Oral 95 16 99 %  12/30/19 1015 106/64 98.1 F (36.7 C) Oral 99 15 97 %  12/30/19 0914 102/65 98.4 F (36.9 C) Oral 96 15 97 %    @flow {1959:LAST@   Intake/Output from previous day:   05/14 0701 - 05/15 0700 In: 494 [P.O.:480; I.V.:14] Out: -    Intake/Output this shift:   No intake/output data recorded.   Intake/Output      05/14 0701 - 05/15 0700 05/15 0701 - 05/16 0700   P.O. 480    I.V. (mL/kg) 14 (0.2)    Blood     Total Intake(mL/kg) 494 (6.4)    Net +494         Urine Occurrence 8 x    Stool Occurrence 2 x       LABORATORY DATA: Recent Labs    12/26/19 0358 12/26/19 0801 12/27/19 0358 12/27/19 1518 12/28/19 1132 12/29/19 0739 12/30/19 0252 12/30/19 1430 12/31/19 0414  WBC 11.0*  --  13.6* 12.9* 12.8* 12.5* 11.2*  --  10.4  HGB 7.9*   < > 7.3* 7.2* 7.2* 7.4* 6.8* 9.5* 9.4*  HCT 24.7*   < > 23.1* 22.8* 23.1* 23.8* 21.8* 29.8* 29.6*  PLT 261  --  307 300 332 412* 415*  --  449*   < > = values in this interval not displayed.   Recent Labs    12/26/19 0358 12/31/19 0414  NA 139 140  K 3.8 4.3  CL 105 105  CO2 27 24  BUN 15 20  CREATININE 1.00  0.91  GLUCOSE 114* 98  CALCIUM 8.4* 8.6*   No results found for: INR, PROTIME  Examination:  General appearance: alert and cooperative  Wound Exam: clean, dry, intact   Drainage:  None: wound tissue dry  Motor Exam: Quadriceps and Hamstrings Intact  Sensory Exam: Superficial Peroneal, Deep Peroneal and Tibial normal   Assessment:    5 Days Post-Op  Procedure(s) (LRB): OPEN REDUCTION INTERNAL FIXATION (ORIF) TIBIAL PLATEAU (Left) OPEN REDUCTION INTERNAL FIXATION (ORIF) CALCANEOUS FRACTURE (Right) Removal External Fixation Leg (Left)  ADDITIONAL DIAGNOSIS:  Principal Problem:   Closed bicondylar fracture of left tibial plateau Active Problems:   Closed displaced fracture of left tibial tuberosity   Fall from ladder   Closed displaced intra-articular fracture of right calcaneus   Avulsion fracture of distal fibula  Plan: Physical Therapy as ordered Non Weight Bearing (NWB)  DVT Prophylaxis:  Lovenox  DISCHARGE PLAN: Skilled Nursing Facility/Rehab  Pain under control and patient resting in bed. Continue PT NWB. Hemoglobin appears stable after 2 units at 9.4, will continue to monitor. Awaiting bed at SNF for D/C  Donia Ast 12/31/2019, 7:47 AM

## 2020-01-01 LAB — CBC
HCT: 30.2 % — ABNORMAL LOW (ref 36.0–46.0)
Hemoglobin: 9.4 g/dL — ABNORMAL LOW (ref 12.0–15.0)
MCH: 29.3 pg (ref 26.0–34.0)
MCHC: 31.1 g/dL (ref 30.0–36.0)
MCV: 94.1 fL (ref 80.0–100.0)
Platelets: 476 10*3/uL — ABNORMAL HIGH (ref 150–400)
RBC: 3.21 MIL/uL — ABNORMAL LOW (ref 3.87–5.11)
RDW: 14.1 % (ref 11.5–15.5)
WBC: 11.5 10*3/uL — ABNORMAL HIGH (ref 4.0–10.5)
nRBC: 0 % (ref 0.0–0.2)

## 2020-01-01 MED ORDER — FAMOTIDINE 20 MG PO TABS
20.0000 mg | ORAL_TABLET | Freq: Two times a day (BID) | ORAL | Status: DC
  Administered 2020-01-01 – 2020-01-04 (×8): 20 mg via ORAL
  Filled 2020-01-01 (×8): qty 1

## 2020-01-01 MED ORDER — LORAZEPAM 0.5 MG PO TABS
0.5000 mg | ORAL_TABLET | Freq: Two times a day (BID) | ORAL | Status: DC | PRN
  Administered 2020-01-03: 0.5 mg via ORAL
  Filled 2020-01-01 (×2): qty 1

## 2020-01-01 NOTE — Progress Notes (Signed)
SPORTS MEDICINE AND JOINT REPLACEMENT  Lara Mulch, MD    Carlyon Shadow, PA-C [place], [place], [place]  [address]                             [phone]   PROGRESS NOTE  Subjective:  negative for Chest Pain  negative for Shortness of Breath  negative for Nausea/Vomiting   negative for Calf Pain  negative for Bowel Movement   Tolerating Diet: yes         Patient reports pain as 3 on 0-10 scale.    Objective: Vital signs in last 24 hours:    Patient Vitals for the past 24 hrs:  BP Temp Temp src Pulse Resp SpO2  01/01/20 0329 92/63 98.3 F (36.8 C) Oral 91 16 100 %  12/31/19 1942 113/60 98.6 F (37 C) Oral 98 18 96 %  12/31/19 1557 104/66 98 F (36.7 C) - 92 18 98 %  12/31/19 0922 106/64 - - 98 18 98 %    @flow {1959:LAST@   Intake/Output from previous day:   05/15 0701 - 05/16 0700 In: 2269.2 [P.O.:240; I.V.:1929.2] Out: -    Intake/Output this shift:   No intake/output data recorded.   Intake/Output      05/15 0701 - 05/16 0700 05/16 0701 - 05/17 0700   P.O. 240    I.V. (mL/kg) 1929.2 (25)    Other 100    IV Piggyback 0    Total Intake(mL/kg) 2269.2 (29.4)    Net +2269.2         Urine Occurrence 2 x    Stool Occurrence 1 x       LABORATORY DATA: Recent Labs    12/27/19 0358 12/27/19 0358 12/27/19 1518 12/28/19 1132 12/29/19 0739 12/30/19 0252 12/30/19 1430 12/31/19 0414 01/01/20 0251  WBC 13.6*  --  12.9* 12.8* 12.5* 11.2*  --  10.4 11.5*  HGB 7.3*   < > 7.2* 7.2* 7.4* 6.8* 9.5* 9.4* 9.4*  HCT 23.1*   < > 22.8* 23.1* 23.8* 21.8* 29.8* 29.6* 30.2*  PLT 307  --  300 332 412* 415*  --  449* 476*   < > = values in this interval not displayed.   Recent Labs    12/26/19 0358 12/31/19 0414  NA 139 140  K 3.8 4.3  CL 105 105  CO2 27 24  BUN 15 20  CREATININE 1.00 0.91  GLUCOSE 114* 98  CALCIUM 8.4* 8.6*   No results found for: INR, PROTIME  Examination:  General appearance: alert and cooperative  Wound Exam: clean,  dry, intact   Drainage:  None: wound tissue dry  Motor Exam: Quadriceps and Hamstrings Intact  Sensory Exam: Superficial Peroneal, Deep Peroneal and Tibial normal   Assessment:    6 Days Post-Op  Procedure(s) (LRB): OPEN REDUCTION INTERNAL FIXATION (ORIF) TIBIAL PLATEAU (Left) OPEN REDUCTION INTERNAL FIXATION (ORIF) CALCANEOUS FRACTURE (Right) Removal External Fixation Leg (Left)  ADDITIONAL DIAGNOSIS:  Principal Problem:   Closed bicondylar fracture of left tibial plateau Active Problems:   Closed displaced fracture of left tibial tuberosity   Fall from ladder   Closed displaced intra-articular fracture of right calcaneus   Avulsion fracture of distal fibula     Plan: Physical Therapy as ordered Non Weight Bearing (NWB)  DVT Prophylaxis:  Lovenox  DISCHARGE PLAN: Skilled Nursing Facility/Rehab  Patient awake and comfortable at bedside, Pain is under control. Up with therapy as tolerated.  Hemoglobin appears stable after 2 units Friday. Patient still waiting on bed at SNF. Will continue to follow.  Donia Ast 01/01/2020, 7:05 AM

## 2020-01-02 LAB — CBC
HCT: 31 % — ABNORMAL LOW (ref 36.0–46.0)
Hemoglobin: 9.8 g/dL — ABNORMAL LOW (ref 12.0–15.0)
MCH: 30.2 pg (ref 26.0–34.0)
MCHC: 31.6 g/dL (ref 30.0–36.0)
MCV: 95.4 fL (ref 80.0–100.0)
Platelets: 572 10*3/uL — ABNORMAL HIGH (ref 150–400)
RBC: 3.25 MIL/uL — ABNORMAL LOW (ref 3.87–5.11)
RDW: 14 % (ref 11.5–15.5)
WBC: 10.6 10*3/uL — ABNORMAL HIGH (ref 4.0–10.5)
nRBC: 0 % (ref 0.0–0.2)

## 2020-01-02 NOTE — Plan of Care (Signed)
  Problem: Pain Managment: Goal: General experience of comfort will improve Outcome: Progressing   Problem: Safety: Goal: Ability to remain free from injury will improve Outcome: Progressing   Problem: Skin Integrity: Goal: Risk for impaired skin integrity will decrease Outcome: Progressing   

## 2020-01-02 NOTE — Progress Notes (Signed)
Physical Therapy Treatment Patient Details Name: Cindy Mcclure MRN: [id number] DOB: [birth year] Today's Date: 01/02/2020    History of Present Illness 54 yo female s/p fall from ladder admitted om 12/22/19 with L bicondylar tibial plateau fx, L fibial fx R rib fx  and R  fibula fx. ORIF left tibial plateau, tibial tubercle fracture, ORIF right calcaneus, nonoperative management of right distal fibular avulsion fracture on 12/26/2019. S/p 2 units PRBC on 5/16. PMH chronic back pain, DDD, partial seizure, panic attacks.    PT Comments    Pt in bed upon arrival of PT, agreeable to session with focus on mobilizing to recliner. The pt was able to demo great independence with bed mobility and scooting to complete A-P transfer to recliner with only intermittent assist to complete transfer. The pt remains limited by poor functional strength in BUE to complete mobility as she is NWB BLE at this time. Discussed goal in future session of transfers to [facility] to progress ability to transfer to multiple surfaces as well as to progress independence with WC mobility. The pt is agreeable, and will continue to benefit from skilled PT to continue to maximize functional mobility and independence.    Follow Up Recommendations  SNF;Supervision/Assistance - 24 hour     Equipment Recommendations  Wheelchair (measurements PT);Wheelchair cushion (measurements PT);Other (comment)    Recommendations for Other Services       Precautions / Restrictions Precautions Precautions: Fall Required Braces or Orthoses: Knee Immobilizer - Left Knee Immobilizer - Left: On at all times;Other (comment)(locked in extension at night, can be unlocked for mobility) Other Brace: LLE hinge knee brace, left night splint for DF Restrictions Weight Bearing Restrictions: Yes RLE Weight Bearing: Non weight bearing LLE Weight Bearing: Non weight bearing    Mobility  Bed Mobility Overal bed mobility: Needs Assistance Bed Mobility:  Supine to Sit     Supine to sit: Supervision     General bed mobility comments: pt able to come to long sit from flat bed, then pivot BLE in bed to prepare for A-P transfer with only minA to LLE  Transfers Overall transfer level: Needs assistance Equipment used: None Transfers: Comptroller transfers: Min assist   General transfer comment: minA to pad at the very end of trasnfer to position in chair, otherwise pt operating on minG level, good functional use of BUE  Ambulation/Gait Ambulation/Gait assistance: (pt unable, NWB BLE)               Stairs             Wheelchair Mobility    Modified Rankin (Stroke Patients Only)       Balance Overall balance assessment: Needs assistance Sitting-balance support: Feet unsupported;No upper extremity supported Sitting balance-Leahy Scale: Good                                      Cognition Arousal/Alertness: Awake/alert Behavior During Therapy: Flat affect Overall Cognitive Status: History of cognitive impairments - at baseline                                 General Comments: flat affect, requires increased time to process information      Exercises Other Exercises Other Exercises: chair push ups: 2 x 5 with focus on not pressing through BLE  General Comments General comments (skin integrity, edema, etc.): no drainage on dressings, pt asking for purewick when she is up in chair as she is unable to transfer to commode      Pertinent Vitals/Pain Pain Assessment: Faces Faces Pain Scale: Hurts little more Pain Location: L leg Pain Descriptors / Indicators: Grimacing;Sore Pain Intervention(s): Limited activity within patient's tolerance;Monitored during session    Home Living                      Prior Function            PT Goals (current goals can now be found in the care plan section) Acute Rehab PT Goals Patient  Stated Goal: none stated PT Goal Formulation: With patient Time For Goal Achievement: 01/07/20 Potential to Achieve Goals: Good Progress towards PT goals: Progressing toward goals    Frequency    Min 3X/week      PT Plan Current plan remains appropriate    Co-evaluation              AM-PAC PT "6 Clicks" Mobility   Outcome Measure  Help needed turning from your back to your side while in a flat bed without using bedrails?: A Little Help needed moving from lying on your back to sitting on the side of a flat bed without using bedrails?: A Little Help needed moving to and from a bed to a chair (including a wheelchair)?: A Little Help needed standing up from a chair using your arms (e.g., wheelchair or bedside chair)?: Total Help needed to walk in hospital room?: Total Help needed climbing 3-5 steps with a railing? : Total 6 Click Score: 12    End of Session Equipment Utilized During Treatment: Gait belt;Left knee immobilizer(left knee hinge brace) Activity Tolerance: Patient tolerated treatment well Patient left: with call bell/phone within reach;in chair;with chair alarm set;with nursing/sitter in room Nurse Communication: Mobility status PT Visit Diagnosis: Pain;Other abnormalities of gait and mobility (R26.89) Pain - Right/Left: Left Pain - part of body: Leg     Time: [id number] PT Time Calculation (min) (ACUTE ONLY): 23 min  Charges:  $Therapeutic Exercise: 8-22 mins $Therapeutic Activity: 8-22 mins                    Karma Ganja, PT, DPT   Acute Rehabilitation Department Pager #: [phone]   Otho Bellows 01/02/2020, 11:37 AM

## 2020-01-02 NOTE — Progress Notes (Signed)
Orthopaedic Trauma Progress Note  S: Awaiting placement. Pain controlled. No issues  O:  Vitals:   01/01/20 1939 01/02/20 0356  BP: 109/65 99/60  Pulse: 80 68  Resp: 16 17  Temp: 98.6 F (37 C) 98.7 F (37.1 C)  SpO2: 100% 100%    General: Laying in bed comfortably, sleepy but easily arousable.  No acute distress Respiratory: No increased work of breathing.  Right lower extremity: Short leg splint in place.  Nontender above splint.  Sensation intact to light touch of the toes.  Knee motion without discomfort.  Able to wiggle her toes.  Extremity well perfused.  Brisk cap refill Left lower extremity - Hinge knee brace in place.  Incisions clean, dry, intact.  Compartments soft and compressible. Able to wiggle toes.    Imaging: Stable post op imaging.   Labs:  Results for orders placed or performed during the hospital encounter of 12/22/19 (from the past 24 hour(s))  CBC     Status: Abnormal   Collection Time: 01/02/20  7:07 AM  Result Value Ref Range   WBC 10.6 (H) 4.0 - 10.5 K/uL   RBC 3.25 (L) 3.87 - 5.11 MIL/uL   Hemoglobin 9.8 (L) 12.0 - 15.0 g/dL   HCT 31.0 (L) 36.0 - 46.0 %   MCV 95.4 80.0 - 100.0 fL   MCH 30.2 26.0 - 34.0 pg   MCHC 31.6 30.0 - 36.0 g/dL   RDW 14.0 11.5 - 15.5 %   Platelets 572 (H) 150 - 400 K/uL   nRBC 0.0 0.0 - 0.2 %    Assessment: 54 year old female s/p fall from ladder, 7 Days Post-Op   Injuries: 1.  Left bicondylar tibial plateau fracture s/p removal of external fixator and ORIF 2.  Right [place] to calcaneus fracture s/p ORIF 3.  Right distal fibular avulsion fracture s/p nonoperative management  Weightbearing: NWB BLE  Insicional and dressing care: Leave splint in place RLE. okay to leave LLE incision open to air, change dressing PRN  Orthopedic device(s): Splint RLE, hinged knee brace LLE  Okay to unlock hinged knee brace on left lower extremity for gentle range of motion.  Locked in full extension at night.   CV/Blood loss: Acute blood  loss anemia, Hgb stable s/p 2 units PRBC  Pain management:  1. Tylenol 650 mg q 6 hours scheduled 2. Robaxin 500 mg q 6 hours PRN 3. Oxycodone 5-10 mg q 4 hours PRN 4. Neurontin 100 mg TID 5. Morphine 1-2 mg q  hours PRN  VTE prophylaxis: Lovenox  ID:  Ancef 2gm post op completed  Foley/Lines:  No foley, KVO IVFs  Medical co-morbidities: Seizure disorder  Impediments to Fracture Healing: Polytrauma.  Vitamin D level looks okay at 35, no need for supplementation at this time.   Dispo: Up with therapies as tolerated, plan for SNF once available. Okay to DC from ortho perspective  Follow - up plan: 1-2 weeks for repeat x-rays and splint removal  Contact information:  Katha Hamming MD, Patrecia Pace PA-C  Shona Needles, MD [facility] [phone] (office) [email or web address]

## 2020-01-02 NOTE — NC FL2 (Signed)
[place] LEVEL OF CARE SCREENING TOOL     IDENTIFICATION  Patient Name: Cindy Mcclure Birthdate: Dec 30, 1965 Sex: female Admission Date (Current Location): 12/22/2019  [place] and [place] Number:  Cindy Mcclure [id number] [place] and Address:  [facility], [place] [address], [place],  [address]      Provider Number: [id number]  Attending Physician Name and Address:  Shona Needles, MD  Relative Name and Phone Number:  Esperanza Richters, mother - [phone]    Current Level of Care: Hospital Recommended Level of Care: [place] Prior Approval Number:    Date Approved/Denied:   PASRR Number:    Discharge Plan: SNF    Current Diagnoses: Patient Active Problem List   Diagnosis Date Noted  . Closed displaced fracture of left tibial tuberosity 12/26/2019  . Fall from ladder 12/26/2019  . Closed displaced intra-articular fracture of right calcaneus 12/26/2019  . Avulsion fracture of distal fibula 12/26/2019  . Closed bicondylar fracture of left tibial plateau 12/22/2019  . 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 Tendinitis of upper biceps tendon of left shoulder 03/23/2019  . Nonrheumatic mitral valve regurgitation 03/22/2019  . MDD (major depressive disorder), single episode, mild ([place]) 02/24/2019  . GAD (generalized anxiety disorder) 02/24/2019  . Rotator cuff tendinitis, left 03/08/2018  . Traumatic incomplete tear of left rotator cuff 03/08/2018  . Left arm pain 01/05/2018  . Neck pain 01/05/2018  . Frequent falls 07/29/2016  . Mixed stress and urge urinary incontinence 07/01/2016  . Constipation, unspecified 05/05/2016  . Mixed incontinence 12/12/2015  . Cholecystitis with cholelithiasis 12/06/2015  . Cholecystitis   . Other specified anxiety disorders 04/26/2015  . Cervical radiculitis 04/20/2015  . Degeneration of intervertebral disc of cervical region 04/20/2015  . Encounter for  therapeutic drug level monitoring 01/18/2015  . Arthritis of knee, degenerative 12/29/2013  . Partial epilepsy with impairment of consciousness ([place]) 12/09/2011  . Allergic rhinitis 02/21/2008    Orientation RESPIRATION BLADDER Height & Weight     Self, Time, Situation, Place  Normal Continent Weight: 77.1 kg Height:  5' (152.4 cm)  BEHAVIORAL SYMPTOMS/MOOD NEUROLOGICAL BOWEL NUTRITION STATUS    [place] Continent Diet(see discharge summary)  AMBULATORY STATUS COMMUNICATION OF NEEDS Skin   Extensive Assist(Patient is nonweight-bearing for 6 weeks due to injuries) Verbally Surgical wounds, Bruising(bruising left arm, right ankle, surgical incisions on right leg and left leg)                       Personal Care Assistance Level of Assistance  Dressing     Dressing Assistance: Limited assistance     Functional Limitations Info  Sight, Hearing, Speech Sight Info: Adequate Hearing Info: Adequate Speech Info: Adequate    SPECIAL CARE FACTORS FREQUENCY  PT (By licensed PT), OT (By licensed OT)     PT Frequency: 5 times per week OT Frequency: 5 times per week            Contractures Contractures Info: Not present    Additional Factors Info  Code Status, Allergies Code Status Info: Full code Allergies Info: no known drug allergies           Current Medications (01/02/2020):  This is the current hospital active medication list Current Facility-Administered Medications  Medication Dose Route Frequency Provider Last Rate Last Admin  . 0.9 %  sodium chloride infusion (Manually program via Guardrails IV Fluids)   Intravenous Once Patrecia Pace  A, PA-C      . 0.9 %  sodium chloride infusion   Intravenous Continuous Delray Alt, PA-C 50 mL/hr at 01/02/20 0001 New Bag at 01/02/20 0001  . acetaminophen (TYLENOL) tablet 650 mg  650 mg Oral Q6H Patrecia Pace A, PA-C   650 mg at 01/02/20 1326  . alum & mag hydroxide-simeth (MAALOX/MYLANTA) 200-200-20 MG/5ML suspension 30  mL  30 mL Oral Q6H PRN Patrecia Pace A, PA-C   30 mL at 01/01/20 0930  . aspirin tablet 325 mg  325 mg Oral Daily Delray Alt, PA-C   325 mg at 01/02/20 [id number]  . bisacodyl (DULCOLAX) suppository 10 mg  10 mg Rectal Daily PRN Patrecia Pace A, PA-C   10 mg at 12/28/19 1900  . cloBAZam (ONFI) tablet 20 mg  20 mg Oral QHS Patrecia Pace A, PA-C   20 mg at 01/01/20 2102  . diphenhydrAMINE (BENADRYL) 12.5 MG/5ML elixir 25 mg  25 mg Oral Q4H PRN Patrecia Pace A, PA-C   25 mg at 12/28/19 2200  . docusate sodium (COLACE) capsule 100 mg  100 mg Oral BID Patrecia Pace A, PA-C   100 mg at 01/02/20 [id number]  . enoxaparin (LOVENOX) injection 40 mg  40 mg Subcutaneous Q24H Patrecia Pace A, PA-C   40 mg at 01/02/20 [id number]  . famotidine (PEPCID) tablet 20 mg  20 mg Oral BID Donia Ast, [place]   20 mg at 01/02/20 [id number]  . feeding supplement (ENSURE ENLIVE) (ENSURE ENLIVE) liquid 237 mL  237 mL Oral BID BM Patrecia Pace A, PA-C   237 mL at 01/02/20 1326  . ferrous sulfate tablet 325 mg  325 mg Oral Q breakfast Delray Alt, PA-C   325 mg at 01/02/20 [id number]  . gabapentin (NEURONTIN) capsule 100 mg  100 mg Oral TID Patrecia Pace A, PA-C   100 mg at 01/02/20 [id number]  . lamoTRIgine (LAMICTAL) tablet 150 mg  150 mg Oral BID Patrecia Pace A, PA-C   150 mg at 01/02/20 [id number]  . levETIRAcetam (KEPPRA) tablet 750 mg  750 mg Oral BID Patrecia Pace A, PA-C   750 mg at 01/02/20 [id number]  . LORazepam (ATIVAN) tablet 0.5 mg  0.5 mg Oral BID PRN Donia Ast, PA      . magnesium citrate solution 1 Bottle  1 Bottle Oral Once PRN Delray Alt, PA-C      . methocarbamol (ROBAXIN) tablet 500 mg  500 mg Oral Q6H PRN Delray Alt, PA-C   500 mg at 01/01/20 2103   Or  . methocarbamol (ROBAXIN) 500 mg in dextrose 5 % 50 mL IVPB  500 mg Intravenous Q6H PRN Delray Alt, PA-C      . metoCLOPramide (REGLAN) tablet 5-10 mg  5-10 mg Oral Q8H PRN Delray Alt, PA-C       Or  . metoCLOPramide (REGLAN) injection 5-10 mg  5-10 mg  Intravenous Q8H PRN Delray Alt, PA-C      . morphine 2 MG/ML injection 1-2 mg  1-2 mg Intravenous Q2H PRN Delray Alt, PA-C      . multivitamin with minerals tablet 1 tablet  1 tablet Oral Daily Delray Alt, PA-C   1 tablet at 01/02/20 0906  . ondansetron (ZOFRAN) tablet 4 mg  4 mg Oral Q6H PRN Patrecia Pace A, PA-C   4 mg at 01/01/20 0930   Or  . ondansetron (ZOFRAN) injection 4 mg  4 mg Intravenous Q6H PRN Ricci Barker,  Sarah A, PA-C      . oxyCODONE (Oxy IR/ROXICODONE) immediate release tablet 5-10 mg  5-10 mg Oral Q4H PRN Delray Alt, PA-C   10 mg at 01/01/20 2103  . pantoprazole (PROTONIX) EC tablet 40 mg  40 mg Oral Daily Patrecia Pace A, PA-C   40 mg at 01/02/20 [id number]  . polyethylene glycol (MIRALAX / GLYCOLAX) packet 17 g  17 g Oral Daily PRN Patrecia Pace A, PA-C   17 g at 12/28/19 1624  . sertraline (ZOLOFT) tablet 100 mg  100 mg Oral Daily Patrecia Pace A, PA-C   100 mg at 01/02/20 [id number]  . sorbitol 70 % solution 30 mL  30 mL Oral Daily PRN Patrecia Pace A, PA-C   30 mL at 12/28/19 2200  . zonisamide (ZONEGRAN) capsule 400 mg  400 mg Oral Q supper Delray Alt, PA-C   400 mg at 01/01/20 1703     Discharge Medications: Please see discharge summary for a list of discharge medications.  Relevant Imaging Results:  Relevant Lab Results:   Additional Information SS # [id number]  Curlene Labrum, RN

## 2020-01-03 LAB — SARS CORONAVIRUS 2 (TAT 6-24 HRS): SARS Coronavirus 2: NEGATIVE

## 2020-01-03 MED ORDER — VANCOMYCIN HCL IN DEXTROSE 1-5 GM/200ML-% IV SOLN
INTRAVENOUS | Status: AC
  Filled 2020-01-03: qty 200

## 2020-01-03 NOTE — Progress Notes (Signed)
Occupational Therapy Treatment Patient Details Name: Cindy Mcclure MRN: [id number] DOB: 1965-10-25 Today's Date: 01/03/2020    History of present illness 54 yo female s/p fall from ladder admitted om 12/22/19 with L bicondylar tibial plateau fx, L fibial fx R rib fx  and R  fibula fx. ORIF left tibial plateau, tibial tubercle fracture, ORIF right calcaneus, nonoperative management of right distal fibular avulsion fracture on 12/26/2019. S/p 2 units PRBC on 5/16. PMH chronic back pain, DDD, partial seizure, panic attacks.   OT comments  Pt in bed upon arrival. Pt agreeable to OT session. She completed bed mobility at modified independent level and required minA for anterior/posterior transfer to recliner. While sitting in the recliner she completed grooming task. Educated pt on proper wear schedule for left foot night splint, pt verbalized understanding. Pt will continue to benefit from skilled OT services to maximize safety and independence with ADL/IADL and functional mobility. Will continue to follow acutely and progress as tolerated.    Follow Up Recommendations  SNF(uncertain family (A))    Equipment Recommendations  3 in 1 bedside commode    Recommendations for Other Services Other (comment)(very flat affect question if need for chaplain or psych )    Precautions / Restrictions Precautions Precautions: Fall Required Braces or Orthoses: Other Brace Knee Immobilizer - Left: On at all times;Other (comment)(locked in extension at night, can be unlocked for mobility) Other Brace: LLE hinge knee brace, left night splint for DF Restrictions Weight Bearing Restrictions: Yes RLE Weight Bearing: Non weight bearing LLE Weight Bearing: Non weight bearing       Mobility Bed Mobility Overal bed mobility: Modified Independent             General bed mobility comments: increased time and effort  Transfers Overall transfer level: Needs assistance Equipment used: None Transfers:  Comptroller transfers: Min assist   General transfer comment: minA for boost at the end    Balance Overall balance assessment: Needs assistance Sitting-balance support: Feet unsupported;No upper extremity supported Sitting balance-Leahy Scale: Good                                     ADL either performed or assessed with clinical judgement   ADL Overall ADL's : Needs assistance/impaired     Grooming: Set up;Sitting;Oral care;Wash/dry face Grooming Details (indicate cue type and reason): while sitting in recliner             Lower Body Dressing: Moderate assistance Lower Body Dressing Details (indicate cue type and reason): modA to adjust knee hinge, totalA for left foot night splint Toilet Transfer: Minimal assistance;Anterior/posterior Armed forces technical officer Details (indicate cue type and reason): from EOB to recliner           General ADL Comments: minA for anterior posterior transfer to reclienr     Vision   Vision Assessment?: No apparent visual deficits   Perception     Praxis      Cognition Arousal/Alertness: Awake/alert Behavior During Therapy: Flat affect Overall Cognitive Status: History of cognitive impairments - at baseline                                 General Comments: flat affect, requires increased time to process information        Exercises Total Joint Exercises  Hip ABduction/ADduction: AAROM;5 reps;Both;Supine Straight Leg Raises: AAROM;5 reps;Both;Supine General Exercises - Lower Extremity Ankle Circles/Pumps: Left;10 reps;Supine Straight Leg Raises: Right;5 reps;Supine   Shoulder Instructions       General Comments edema noted in LLE     Pertinent Vitals/ Pain       Pain Assessment: No/denies pain Pain Intervention(s): Monitored during session  Home Living                                          Prior Functioning/Environment               Frequency  Min 3X/week        Progress Toward Goals  OT Goals(current goals can now be found in the care plan section)  Progress towards OT goals: Progressing toward goals  Acute Rehab OT Goals Patient Stated Goal: none stated OT Goal Formulation: With patient Time For Goal Achievement: 01/07/20 Potential to Achieve Goals: Good ADL Goals Pt Will Perform Lower Body Dressing: with min guard assist;bed level Pt Will Transfer to Toilet: with min guard assist;bedside commode;with transfer board(drop arm) Additional ADL Goal #1: Pt will complete don doff splint mod I Additional ADL Goal #2: Pt will demonstrate L LE edema management with splint on 2 pillows mod I Additional ADL Goal #3: Pt will complete bed mobility independently  Plan Discharge plan remains appropriate    Co-evaluation                 AM-PAC OT "6 Clicks" Daily Activity     Outcome Measure   Help from another person eating meals?: A Little Help from another person taking care of personal grooming?: A Little Help from another person toileting, which includes using toliet, bedpan, or urinal?: A Lot Help from another person bathing (including washing, rinsing, drying)?: A Lot Help from another person to put on and taking off regular upper body clothing?: A Little Help from another person to put on and taking off regular lower body clothing?: A Lot 6 Click Score: 15    End of Session Equipment Utilized During Treatment: Gait belt(left knee hinge brace)  OT Visit Diagnosis: Unsteadiness on feet (R26.81);Muscle weakness (generalized) (M62.81);Pain Pain - Right/Left: Left Pain - part of body: Leg   Activity Tolerance Patient tolerated treatment well   Patient Left in chair;with call bell/phone within reach;with chair alarm set;with family/visitor present   Nurse Communication Mobility status;Precautions        Time: [id number] OT Time Calculation (min): 25 min  Charges: OT General  Charges $OT Visit: 1 Visit OT Treatments $Self Care/Home Management : 23-37 mins  Helene Kelp OTR/L Acute Rehabilitation Services Office: [place] 01/03/2020, 3:42 PM

## 2020-01-03 NOTE — Discharge Instructions (Signed)
[facility] Discharge Instructions   General Discharge Instructions  WEIGHT BEARING STATUS: Non-weightbearing both legs  RANGE OF MOTION/ACTIVITY: Okay for unrestricted range of motion of left knee in hinge knee brace. Lock knee brace in full extension at night  Wound Care: Do not remove splint from right leg until follow-up appointment. Incisions on left leg can be left open to air if there is no drainage. If incision continues to have drainage, follow wound care instructions below. Okay to shower if no drainage from incisions, keep right leg splint dry and covered when showering.  DVT/PE prophylaxis: Lovenox daily x 30 days  Diet: as you were eating previously.  Can use over the counter stool softeners and bowel preparations, such as Miralax, to help with bowel movements.  Narcotics can be constipating.  Be sure to drink plenty of fluids  PAIN MEDICATION USE AND EXPECTATIONS  You have likely been given narcotic medications to help control your pain.  After a traumatic event that results in an fracture (broken bone) with or without surgery, it is ok to use narcotic pain medications to help control one's pain.  We understand that everyone responds to pain differently and each individual patient will be evaluated on a regular basis for the continued need for narcotic medications. Ideally, narcotic medication use should last no more than 6-8 weeks (coinciding with fracture healing).   As a patient it is your responsibility as well to monitor narcotic medication use and report the amount and frequency you use these medications when you come to your office visit.   We would also advise that if you are using narcotic medications, you should take a dose prior to therapy to maximize you participation.  IF YOU ARE ON NARCOTIC MEDICATIONS IT IS NOT PERMISSIBLE TO OPERATE A MOTOR VEHICLE (MOTORCYCLE/CAR/TRUCK/MOPED) OR HEAVY MACHINERY DO NOT MIX NARCOTICS WITH OTHER CNS (CENTRAL NERVOUS  SYSTEM) DEPRESSANTS SUCH AS ALCOHOL   STOP SMOKING OR USING NICOTINE PRODUCTS!!!!  As discussed nicotine severely impairs your body's ability to heal surgical and traumatic wounds but also impairs bone healing.  Wounds and bone heal by forming microscopic blood vessels (angiogenesis) and nicotine is a vasoconstrictor (essentially, shrinks blood vessels).  Therefore, if vasoconstriction occurs to these microscopic blood vessels they essentially disappear and are unable to deliver necessary nutrients to the healing tissue.  This is one modifiable factor that you can do to dramatically increase your chances of healing your injury.    (This means no smoking, no nicotine gum, patches, etc)  DO NOT USE NONSTEROIDAL ANTI-INFLAMMATORY DRUGS (NSAID'S)  Using products such as Advil (ibuprofen), Aleve (naproxen), Motrin (ibuprofen) for additional pain control during fracture healing can delay and/or prevent the healing response.  If you would like to take over the counter (OTC) medication, Tylenol (acetaminophen) is ok.  However, some narcotic medications that are given for pain control contain acetaminophen as well. Therefore, you should not exceed more than 4000 mg of tylenol in a day if you do not have liver disease.  Also note that there are may OTC medicines, such as cold medicines and allergy medicines that my contain tylenol as well.  If you have any questions about medications and/or interactions please ask your doctor/PA or your pharmacist.      ICE AND ELEVATE INJURED/OPERATIVE EXTREMITY  Using ice and elevating the injured extremity above your heart can help with swelling and pain control.  Icing in a pulsatile fashion, such as 20 minutes on and 20 minutes off, can  be followed.    Do not place ice directly on skin. Make sure there is a barrier between to skin and the ice pack.    Using frozen items such as frozen peas works well as the conform nicely to the are that needs to be iced.  USE AN ACE WRAP  OR TED HOSE FOR SWELLING CONTROL  In addition to icing and elevation, Ace wraps or TED hose are used to help limit and resolve swelling.  It is recommended to use Ace wraps or TED hose until you are informed to stop.    When using Ace Wraps start the wrapping distally (farthest away from the body) and wrap proximally (closer to the body)   Example: If you had surgery on your leg or thing and you do not have a splint on, start the ace wrap at the toes and work your way up to the thigh        If you had surgery on your upper extremity and do not have a splint on, start the ace wrap at your fingers and work your way up to the upper arm  IF YOU ARE IN A SPLINT OR CAST DO NOT [place]   If your splint gets wet for any reason please contact the office immediately. You may shower in your splint or cast as long as you keep it dry.  This can be done by wrapping in a cast cover or garbage back (or similar)  Do Not stick any thing down your splint or cast such as pencils, money, or hangers to try and scratch yourself with.  If you feel itchy take benadryl as prescribed on the bottle for itching  IF YOU ARE IN A CAM BOOT (BLACK BOOT)  You may remove boot periodically. Perform daily dressing changes as noted below.  Wash the liner of the boot regularly and wear a sock when wearing the boot. It is recommended that you sleep in the boot until told otherwise   CALL THE OFFICE WITH ANY QUESTIONS OR CONCERNS: [phone]   VISIT OUR WEBSITE FOR ADDITIONAL INFORMATION: [email or web address]     Discharge Wound Care Instructions  Do NOT apply any ointments, solutions or lotions to pin sites or surgical wounds.  These prevent needed drainage and even though solutions like hydrogen peroxide kill bacteria, they also damage cells lining the pin sites that help fight infection.  Applying lotions or ointments can keep the wounds moist and can cause them to breakdown and open up as well. This can increase  the risk for infection. When in doubt call the office.  Surgical incisions should be dressed daily.  If any drainage is noted, use one layer of adaptic, then gauze, Kerlix, and an ace wrap.  Once the incision is completely dry and without drainage, it may be left open to air out.  Showering may begin 36-48 hours later.  Cleaning gently with soap and water.  Traumatic wounds should be dressed daily as well.    One layer of adaptic, gauze, Kerlix, then ace wrap.  The adaptic can be discontinued once the draining has ceased    If you have a wet to dry dressing: wet the gauze with saline the squeeze as much saline out so the gauze is moist (not soaking wet), place moistened gauze over wound, then place a dry gauze over the moist one, followed by Kerlix wrap, then ace wrap.

## 2020-01-03 NOTE — Progress Notes (Signed)
Orthopaedic Trauma Progress Note  S: Stable for discharge. awaiting SNF placement. Pain controlled. No issues. Has not needed any IV pain medications, will d/c.   O:  Vitals:   01/03/20 0626 01/03/20 0811  BP: 105/66 107/75  Pulse: 92 100  Resp:  17  Temp:  97.7 F (36.5 C)  SpO2: 98%     General: Sitting up in bed comfortably.  No acute distress Respiratory: No increased work of breathing.  Right lower extremity: Short leg splint in place.  Nontender above splint.  Sensation intact to light touch of the toes.  Knee motion without discomfort.  Able to wiggle her toes.  Extremity well perfused.  Brisk cap refill Left lower extremity - Hinge knee brace in place.  Incisions clean, dry, intact. Bruising throughout lower leg. Tolerates small amount of knee motion without significant discomfort.  Compartments soft and compressible. Able to wiggle toes.    Imaging: Stable post op imaging.   Labs:  No results found for this or any previous visit (from the past 24 hour(s)).  Assessment: 54 year old female s/p fall from ladder, 8 Days Post-Op   Injuries: 1.  Left bicondylar tibial plateau fracture s/p removal of external fixator and ORIF 2.  Right Sanders to calcaneus fracture s/p ORIF 3.  Right distal fibular avulsion fracture s/p nonoperative management  Weightbearing: NWB BLE  Insicional and dressing care: Leave splint in place RLE. okay to leave LLE incision open to air  Orthopedic device(s): Splint RLE, hinged knee brace LLE  Okay to unlock hinged knee brace on left lower extremity for gentle range of motion.  Locked in full extension at night.   CV/Blood loss: Acute blood loss anemia, Hgb stable s/p 2 units PRBC  Pain management:  1. Tylenol 650 mg q 6 hours scheduled 2. Robaxin 500 mg q 6 hours PRN 3. Oxycodone 5-10 mg q 4 hours PRN 4. Neurontin 100 mg TID  VTE prophylaxis: Lovenox  ID:  Ancef 2gm post op completed  Foley/Lines:  No foley, KVO IVFs  Medical  co-morbidities: Seizure disorder  Impediments to Fracture Healing: Polytrauma.  Vitamin D level looks okay at 35, no need for supplementation at this time.   Dispo: Up with therapies as tolerated, plan for SNF once available. Okay to DC from ortho perspective  Follow - up plan: 1-2 weeks for repeat x-rays and splint removal  Contact information:  Katha Hamming MD, Patrecia Pace PA-C   [facility] [phone] (office) [email or web address]

## 2020-01-03 NOTE — Plan of Care (Signed)

## 2020-01-03 NOTE — Progress Notes (Signed)
Nutrition Follow-up  DOCUMENTATION CODES:   Obesity unspecified  INTERVENTION:   -D/c Ensure Enlive po BID, each supplement provides 350 kcal and 20 grams of protein -Continue MVI with minerals daily  NUTRITION DIAGNOSIS:   Increased nutrient needs related to post-op healing as evidenced by estimated needs.  Ongoing  GOAL:   Patient will meet greater than or equal to 90% of their needs  Progressing  MONITOR:   PO intake, Supplement acceptance, Diet advancement, Labs, Weight trends, Skin, I & O's  REASON FOR ASSESSMENT:   Consult Assessment of nutrition requirement/status  ASSESSMENT:   Cindy Mcclure was about 35ft up a ladder when she lost her balance and fell. She landed on her feet then fell backwards. She did not hit her head or black out. She c/o right ankle and left knee pain. She was brought to the ED where x-rays showed a right ankle/calc fx and left tibia plateau fx and orthopedic surgery was consulted.  5/6- s/pPROCEDURE: External fixationleftspanning knee  Reviewed I/O's: +1.4 L x 24 hours and +5.1 L since admission  Attempted to speak with pt via phone, however, no answer.   Pt's intake has improved since last visit. Noted meal completion 50-100%.   Pt medically stable for discharge; awaiting SNF bed.   Medications reviewed and include pepcid and colace.   Labs reviewed.   Diet Order:   Diet Order            Diet regular Room service appropriate? No; Fluid consistency: Thin  Diet effective now              EDUCATION NEEDS:   No education needs have been identified at this time  Skin:  Skin Assessment: Skin Integrity Issues: Skin Integrity Issues:: Incisions Incisions: closed lt leg and closed rt leg  Last BM:  12/31/19  Height:   Ht Readings from Last 1 Encounters:  12/22/19 5' (1.524 m)    Weight:   Wt Readings from Last 1 Encounters:  12/22/19 77.1 kg    Ideal Body Weight:  45.5 kg  BMI:  Body mass index is 33.2  kg/m.  Estimated Nutritional Needs:   Kcal:  [id number]  Protein:  95-110 grams  Fluid:  > 1.7 L    Loistine Chance, RD, LDN, [place] Registered Dietitian II Certified Diabetes Care and Education Specialist Please refer to [facility] for RD and/or RD on-call/weekend/after hours pager

## 2020-01-03 NOTE — TOC Progression Note (Signed)
Transition of Care [facility]) - Progression Note    Patient Details  Name: Cindy Mcclure MRN: [id number] Date of Birth: 15-Feb-1966  Transition of Care [facility]) CM/SW [place], RN Phone Number: 01/03/2020, 12:50 PM  Clinical Narrative:     Case management received PASSR level 2 number - [id number].  Called [facility] and spoke with Gayla Medicus, [facility] does not have a ready bed available for the patient for today - but will reach out to me tomorrow - pending insurance authorization.  Called [facility] and faxed additional clinicals and asking for insurance auth approval by 01/04/2020 if possible.  Patient to have expected stay at the SNF for more than 4 weeks - due to inability to bear weight due to injuries.   Expected Discharge Plan and Services        Social Determinants of Health (SDOH) Interventions    Readmission Risk Interventions No flowsheet data found.

## 2020-01-04 ENCOUNTER — Other Ambulatory Visit: Payer: Self-pay | Admitting: Psychiatry

## 2020-01-04 DIAGNOSIS — F411 Generalized anxiety disorder: Secondary | ICD-10-CM

## 2020-01-04 DIAGNOSIS — F3342 Major depressive disorder, recurrent, in full remission: Secondary | ICD-10-CM

## 2020-01-04 NOTE — Progress Notes (Signed)
Patient discharging to [facility]. Report called in to [facility] LPN. Awaiting transportation.

## 2020-01-04 NOTE — Discharge Summary (Signed)
Orthopaedic Trauma Service (OTS) Discharge Summary   Patient ID: Cindy Mcclure MRN: [id number] DOB/AGE: 1966/01/22 54 y.o.  Admit date: 12/22/2019 Discharge date: 01/04/2020  Admission Diagnoses: 1. Left bicondylar tibial plateau fracture 2. Right Sanders 2 calcaneus fracture 3. Right distal fibular avulsion fracture  Discharge Diagnoses:  Principal Problem:   Closed bicondylar fracture of left tibial plateau Active Problems:   Closed displaced fracture of left tibial tuberosity   Fall from ladder   Closed displaced intra-articular fracture of right calcaneus   Avulsion fracture of distal fibula   Past Medical History:  Diagnosis Date  . Allergic rhinitis   . Anginal pain [facility])    sees dr Clayborn Bigness  . Anxiety   . Arthritis    all over...knees, ankles, back  . Back pain   . Cervical radiculitis    no surgery, just cortisone shots.    . Constipation   . Constipation   . DDD (degenerative disc disease), cervical   . Dermatophytosis of nail 2019   both feet  . GERD (gastroesophageal reflux disease)   . History of kidney stones    h/o  . Osteoarthritis   . Panic attacks   . Plantar fasciitis    bilaterally  . Seizures ([place]) 1972   unsure of when she had last seizure due to petit mal type. does not know what brings on a seizure.  . Stress bladder incontinence, female   . Urinary incontinence     Procedures Performed: 1. CPT [id number] reduction internal fixation of left tibial plateau fracture 2. CPT 27540-Open reduction internal fixation of left tibial tubercle fracture 3. CPT 20694-Removal of external fixation left leg 4. CPT 28415-Open reduction internal fixation of right calcaneus 5. CPT 27788-Nonoperative management of right distal fibular avulsion fracture  Discharged Condition: good  Hospital Course: Patient presented to [facility] emergency department on 12/22/2019 with complaints of right ankle and left knee pain after falling from an 8 foot  ladder.  Was found to have a right ankle/calcaneus fracture as well as a left tibial plateau fracture.  Orthopedics was consulted for admission and management.  Patient taken to the operating room on day of presentation for external fixation of the left lower extremity by Dr. Erlinda Hong and tolerated this well without complications.  Orthopedic trauma service was consulted for definitive management of her fracture.  Once soft tissue swelling was appropriate patient was taken to the operating room by Dr. Doreatha Martin on 12/26/2019 for the above procedures.  She tolerated this well without complications.  Was placed in a hinged knee brace on the left lower extremity and placed in a short leg splint on the right lower extremity postoperatively.  She was allowed unrestricted range of motion in the hinged knee brace.  Was instructed to be nonweightbearing on bilateral lower extremities.  Began working with physical and occupational therapy starting on postoperative day #1.  Once hemoglobin stabilized patient was started on Lovenox for DVT prophylaxis.  She did require 2 units of blood on postoperative day #4 and her hemoglobin responded appropriately.  Timing of the patient's hospitalization was dedicated to achieving adequate pain control and increasing mobility with therapies.   On 01/04/2020, the patient was tolerating diet, working well with therapies, pain well controlled, vital signs stable, dressings clean, dry, intact and felt stable for discharge to SNF. Patient will follow up as below and knows to call with questions or concerns.     Consults: None  Significant Diagnostic Studies:   Results for  orders placed or performed during the hospital encounter of 12/22/19 (from the past 168 hour(s))  CBC   Collection Time: 12/29/19  7:39 AM  Result Value Ref Range   WBC 12.5 (H) 4.0 - 10.5 K/uL   RBC 2.44 (L) 3.87 - 5.11 MIL/uL   Hemoglobin 7.4 (L) 12.0 - 15.0 g/dL   HCT 23.8 (L) 36.0 - 46.0 %   MCV 97.5 80.0 - 100.0 fL     MCH 30.3 26.0 - 34.0 pg   MCHC 31.1 30.0 - 36.0 g/dL   RDW 14.4 11.5 - 15.5 %   Platelets 412 (H) 150 - 400 K/uL   nRBC 0.6 (H) 0.0 - 0.2 %  CBC   Collection Time: 12/30/19  2:52 AM  Result Value Ref Range   WBC 11.2 (H) 4.0 - 10.5 K/uL   RBC 2.24 (L) 3.87 - 5.11 MIL/uL   Hemoglobin 6.8 (LL) 12.0 - 15.0 g/dL   HCT 21.8 (L) 36.0 - 46.0 %   MCV 97.3 80.0 - 100.0 fL   MCH 30.4 26.0 - 34.0 pg   MCHC 31.2 30.0 - 36.0 g/dL   RDW 14.1 11.5 - 15.5 %   Platelets 415 (H) 150 - 400 K/uL   nRBC 0.5 (H) 0.0 - 0.2 %  Prepare RBC (crossmatch)   Collection Time: 12/30/19  4:25 AM  Result Value Ref Range   Order Confirmation      ORDER PROCESSED BY BLOOD BANK Performed at [facility], [address] [place], [place] [address]   Type and screen [place]   Collection Time: 12/30/19  4:40 AM  Result Value Ref Range   ABO/RH(D) AB POS    Antibody Screen NEG    Sample Expiration 01/02/2020,2359    Unit Number [id number]    Blood Component Type RED CELLS,LR    Unit division 00    Status of Unit ISSUED,FINAL    Transfusion Status OK TO TRANSFUSE    Crossmatch Result Compatible    Unit Number [id number]    Blood Component Type RED CELLS,LR    Unit division 00    Status of Unit ISSUED,FINAL    Transfusion Status OK TO TRANSFUSE    Crossmatch Result      Compatible Performed at [facility], [place] [address], [place], [place] [address]   [facility]   Collection Time: 12/30/19  4:40 AM  Result Value Ref Range   ISSUE DATE / TIME [id number]    Blood Product Unit Number [id number]    PRODUCT CODE [id number]    Unit Type and Rh 8400    Blood Product Expiration Date [id number]    ISSUE DATE / TIME [id number]    Blood Product Unit Number [id number]    PRODUCT CODE [id number]    Unit Type and Rh 8400    Blood Product Expiration Date [id number]   Hemoglobin and hematocrit, blood   Collection Time: 12/30/19  2:30 PM  Result Value Ref Range    Hemoglobin 9.5 (L) 12.0 - 15.0 g/dL   HCT 29.8 (L) 36.0 - [id number] %  Basic metabolic panel   Collection Time: 12/31/19  4:14 AM  Result Value Ref Range   Sodium 140 135 - 145 mmol/L   Potassium 4.3 3.5 - 5.1 mmol/L   Chloride 105 98 - 111 mmol/L   CO2 24 22 - 32 mmol/L   Glucose, Bld 98 70 - 99 mg/dL   BUN 20 6 - 20 mg/dL  Creatinine, Ser 0.91 0.44 - 1.00 mg/dL   Calcium 8.6 (L) 8.9 - 10.3 mg/dL   GFR calc non Af Amer >60 >60 mL/min   GFR calc Af Amer >60 >60 mL/min   Anion gap 11 5 - 15  CBC   Collection Time: 12/31/19  4:14 AM  Result Value Ref Range   WBC 10.4 4.0 - 10.5 K/uL   RBC 3.14 (L) 3.87 - 5.11 MIL/uL   Hemoglobin 9.4 (L) 12.0 - 15.0 g/dL   HCT 29.6 (L) 36.0 - 46.0 %   MCV 94.3 80.0 - 100.0 fL   MCH 29.9 26.0 - 34.0 pg   MCHC 31.8 30.0 - 36.0 g/dL   RDW 14.5 11.5 - 15.5 %   Platelets 449 (H) 150 - 400 K/uL   nRBC 0.5 (H) 0.0 - 0.2 %  CBC   Collection Time: 01/01/20  2:51 AM  Result Value Ref Range   WBC 11.5 (H) 4.0 - 10.5 K/uL   RBC 3.21 (L) 3.87 - 5.11 MIL/uL   Hemoglobin 9.4 (L) 12.0 - 15.0 g/dL   HCT 30.2 (L) 36.0 - 46.0 %   MCV 94.1 80.0 - 100.0 fL   MCH 29.3 26.0 - 34.0 pg   MCHC 31.1 30.0 - 36.0 g/dL   RDW 14.1 11.5 - 15.5 %   Platelets 476 (H) 150 - 400 K/uL   nRBC 0.0 0.0 - 0.2 %  CBC   Collection Time: 01/02/20  7:07 AM  Result Value Ref Range   WBC 10.6 (H) 4.0 - 10.5 K/uL   RBC 3.25 (L) 3.87 - 5.11 MIL/uL   Hemoglobin 9.8 (L) 12.0 - 15.0 g/dL   HCT 31.0 (L) 36.0 - 46.0 %   MCV 95.4 80.0 - 100.0 fL   MCH 30.2 26.0 - 34.0 pg   MCHC 31.6 30.0 - 36.0 g/dL   RDW 14.0 11.5 - 15.5 %   Platelets 572 (H) 150 - 400 K/uL   nRBC 0.0 0.0 - 0.2 %  SARS CORONAVIRUS 2 (TAT 6-24 HRS)   Collection Time: 01/03/20  9:27 AM  Result Value Ref Range   SARS Coronavirus 2 NEGATIVE NEGATIVE     Treatments: IV hydration, antibiotics: Ancef, analgesia: acetaminophen, Vicodin and Dilaudid, anticoagulation: ASA and LMW heparin, therapies: PT and OT and surgery: 1.  CPT 27536-Open reduction internal fixation of left tibial plateau fracture 2. CPT 27540-Open reduction internal fixation of left tibial tubercle fracture 3. CPT 20694-Removal of external fixation left leg 4. CPT 28415-Open reduction internal fixation of right calcaneus 5. CPT 27788-Nonoperative management of right distal fibular avulsion fracture  Discharge Exam: General: Sitting up in bed comfortably.  No acute distress Respiratory: No increased work of breathing.  Right lower extremity: Short leg splint in place.  Nontender above splint.  Sensation intact to light touch of the toes.  Knee motion without discomfort.  Able to wiggle her toes.  Extremity well perfused.  Brisk cap refill Left lower extremity - Hinge knee brace in place.  Incisions clean, dry, intact. Bruising throughout lower leg. Tolerates small amount of knee motion without significant discomfort.  Compartments soft and compressible. Able to wiggle toes.    Disposition: Discharge disposition: 03-Skilled Nursing Facility       Allergies as of 01/04/2020   No Known Allergies     Medication List    STOP taking these medications   diclofenac 75 MG EC tablet Commonly known as: VOLTAREN   traMADol 50 MG tablet Commonly known as: Veatrice Bourbon  TAKE these medications   aspirin EC 81 MG tablet Take 1 tablet (81 mg total) by mouth daily.   CALCIUM 600 + D PO Take 1 tablet by mouth daily.   CENTRUM ADULTS PO Take 1 tablet by mouth daily.   cloBAZam 20 MG tablet Commonly known as: ONFI Take 20 mg by mouth at bedtime.   docusate sodium 100 MG capsule Commonly known as: COLACE Take 1 capsule (100 mg total) by mouth 2 (two) times daily.   enoxaparin 40 MG/0.4ML injection Commonly known as: LOVENOX Inject 0.4 mLs (40 mg total) into the skin daily.   Flaxseed Oil 1000 MG Caps Take 1,000 mg by mouth daily.   gabapentin 100 MG capsule Commonly known as: NEURONTIN Take 1 capsule (100 mg total) by mouth 3 (three)  times daily.   Keppra XR 750 MG Tb24 Generic drug: Levetiracetam Take 750 mg by mouth 2 (two) times daily.   lamoTRIgine 150 MG tablet Commonly known as: LAMICTAL Take 150 mg by mouth 2 (two) times daily.   methocarbamol 500 MG tablet Commonly known as: ROBAXIN Take 1 tablet (500 mg total) by mouth every 6 (six) hours as needed for muscle spasms.   omeprazole 20 MG capsule Commonly known as: PRILOSEC Take 20 mg by mouth every morning.   oxyCODONE 5 MG immediate release tablet Commonly known as: Oxy IR/ROXICODONE Take 1 tablet (5 mg total) by mouth every 6 (six) hours as needed for severe pain.   pyridOXINE 100 MG tablet Commonly known as: VITAMIN B-6 Take 100 mg by mouth daily.   sertraline 100 MG tablet Commonly known as: ZOLOFT Take 1 tablet (100 mg total) by mouth daily. What changed: when to take this   Vitamin A 3 MG (10000 UT) Tabs Take 3,000 Units by mouth daily.   vitamin B-12 500 MCG tablet Commonly known as: CYANOCOBALAMIN Take 500 mcg by mouth daily.   vitamin C 500 MG tablet Commonly known as: ASCORBIC ACID Take 500 mg by mouth daily. What changed: Another medication with the same name was removed. Continue taking this medication, and follow the directions you see here.   Vitamin E 200 units Tabs Take 200 Units by mouth daily.   zonisamide 100 MG capsule Commonly known as: ZONEGRAN Take 400 mg by mouth daily with supper.            Durable Medical Equipment  (From admission, onward)         Start     Ordered   12/23/19 0020  DME Walker rolling  Once    Question:  Patient needs a walker to treat with the following condition  Answer:  History of open reduction and internal fixation (ORIF) procedure   12/23/19 0019   12/23/19 0020  DME 3 n 1  Once     12/23/19 0019   12/23/19 0020  DME Bedside commode  Once    Question:  Patient needs a bedside commode to treat with the following condition  Answer:  History of open reduction and internal  fixation (ORIF) procedure   12/23/19 0019         Follow-up Information    Haddix, Thomasene Lot, MD. Schedule an appointment as soon as possible for a visit in 1 week(s).   Specialty: Orthopedic Surgery Why: for suture removal, repeat x-rays, splint removal Contact information: [place] [address] [phone]           Discharge Instructions and Plan: Patient will be discharged to  SNF. Will be discharged on Lovenox for DVT prophylaxis.  Patient will follow up with Dr. Doreatha Martin in 1 weeks for repeat x-rays, splint removal, and suture removal.   Signed:  Leary Roca. Carmie Kanner ?[phone]? (phone) 01/04/2020, 12:30 PM  [facility] [place] [address] [phone] (F)

## 2020-01-04 NOTE — Progress Notes (Signed)
PTAR on unit to transport patient to [facility]. Vital Signs Stable. Patient belongings including cell phone, glasses and plastic flowers (2 bags) labeled and sent with patient. See Mar for meds given earlier. Patient voided on bedpan.  IV site removed earlier by day shift nurse. Report called to prior to my shift by day shift nurse Iona Beard. Patient

## 2020-01-04 NOTE — Plan of Care (Signed)

## 2020-01-18 ENCOUNTER — Other Ambulatory Visit: Payer: Self-pay | Admitting: Psychiatry

## 2020-01-18 DIAGNOSIS — F411 Generalized anxiety disorder: Secondary | ICD-10-CM

## 2020-01-18 DIAGNOSIS — F3342 Major depressive disorder, recurrent, in full remission: Secondary | ICD-10-CM

## 2020-01-24 ENCOUNTER — Other Ambulatory Visit: Payer: Self-pay

## 2020-01-24 ENCOUNTER — Encounter: Payer: Self-pay | Admitting: Psychiatry

## 2020-01-24 ENCOUNTER — Telehealth (INDEPENDENT_AMBULATORY_CARE_PROVIDER_SITE_OTHER): Payer: 59 | Admitting: Psychiatry

## 2020-01-24 DIAGNOSIS — F3342 Major depressive disorder, recurrent, in full remission: Secondary | ICD-10-CM

## 2020-01-24 DIAGNOSIS — F411 Generalized anxiety disorder: Secondary | ICD-10-CM

## 2020-01-24 NOTE — Progress Notes (Signed)
Provider Location : [facility] Patient Location : [facility]  Virtual Visit via Video Note  I connected with Cindy Mcclure on 01/24/20 at 10:40 AM EDT by a video enabled telemedicine application and verified that I am speaking with the correct person using two identifiers.   I discussed the limitations of evaluation and management by telemedicine and the availability of in person appointments. The patient expressed understanding and agreed to proceed.   I discussed the assessment and treatment plan with the patient. The patient was provided an opportunity to ask questions and all were answered. The patient agreed with the plan and demonstrated an understanding of the instructions.   The patient was advised to call back or seek an in-person evaluation if the symptoms worsen or if the condition fails to improve as anticipated.   [facility] MD OP Progress Note  01/24/2020 12:27 PM Cindy Mcclure  MRN:  [id number]  Chief Complaint:  Chief Complaint    Follow-up     HPI: Cindy Mcclure is a 54 year old Caucasian female on disability, lives in [place], has a history of MDD, GAD, seizure disorder, urinary incontinence, degenerative disc disease was evaluated by telemedicine today.  [facility], status post lower extremity left-sided fracture status post fall from 8 foot ladder .  She reports she is recovering well.  Patient however reports she does have some sleep restlessness since she has to wear braces at night.  She otherwise denies any significant mood symptoms.  She is compliant on Zoloft.  She denies side effects.  She reports appetite is good.  Patient denies any suicidality, homicidality or perceptual disturbances.  Patient denies any other concerns today.  Visit Diagnosis:    ICD-10-CM   1. MDD (major depressive disorder), recurrent, in full remission ([place])  F33.42   2. GAD (generalized anxiety disorder)  F41.1     Past  Psychiatric History: I have reviewed past psychiatric history from my progress note on 11/04/2018  Past Medical History:  Past Medical History:  Diagnosis Date  . Allergic rhinitis   . Anginal pain [facility])    sees dr Clayborn Bigness  . Anxiety   . Arthritis    all over...knees, ankles, back  . Back pain   . Cervical radiculitis    no surgery, just cortisone shots.    . Constipation   . Constipation   . DDD (degenerative disc disease), cervical   . Dermatophytosis of nail 2019   both feet  . GERD (gastroesophageal reflux disease)   . History of kidney stones    h/o  . Osteoarthritis   . Panic attacks   . Plantar fasciitis    bilaterally  . Seizures ([place]) 1972   unsure of when she had last seizure due to petit mal type. does not know what brings on a seizure.  . Stress bladder incontinence, female   . Urinary incontinence     Past Surgical History:  Procedure Laterality Date  . CESAREAN SECTION  [id number]  . CHOLECYSTECTOMY N/A 12/06/2015   Procedure: LAPAROSCOPIC CHOLECYSTECTOMY WITH INTRAOPERATIVE CHOLANGIOGRAM;  Surgeon: Dia Crawford III, MD;  Location: [facility] ORS;  Service: General;  Laterality: N/A;  . COLONOSCOPY WITH PROPOFOL N/A 09/29/2016   Procedure: COLONOSCOPY WITH PROPOFOL;  Surgeon: Manya Silvas, MD;  Location: [facility] ENDOSCOPY;  Service: Endoscopy;  Laterality: N/A;  . EXTERNAL FIXATION LEG Left 12/22/2019   Procedure: External fixation left spanning knee. CPT 20690 uniplane;  Surgeon: Leandrew Koyanagi,  MD;  Location: [place];  Service: Orthopedics;  Laterality: Left;  . EXTERNAL FIXATION REMOVAL Left 12/26/2019   Procedure: Removal External Fixation Leg;  Surgeon: Shona Needles, MD;  Location: [place];  Service: Orthopedics;  Laterality: Left;  . KNEE ARTHROSCOPY WITH EXCISION PLICA Left 9/32/6712   Procedure: KNEE ARTHROSCOPY WITH EXCISION PLICA, PARTIAL SYNOVECTOMY;  Surgeon: Hessie Knows, MD;  Location: [facility] ORS;  Service: Orthopedics;  Laterality: Left;  . KNEE ARTHROSCOPY WITH  MEDIAL MENISECTOMY Right 09/03/2017   Procedure: KNEE ARTHROSCOPY WITH MEDIAL MENISECTOMY, PARTIAL SYNOVECTOMY;  Surgeon: Hessie Knows, MD;  Location: [facility] ORS;  Service: Orthopedics;  Laterality: Right;  . ORIF CALCANEOUS FRACTURE Right 12/26/2019   Procedure: OPEN REDUCTION INTERNAL FIXATION (ORIF) CALCANEOUS FRACTURE;  Surgeon: Shona Needles, MD;  Location: [place];  Service: Orthopedics;  Laterality: Right;  . ORIF TIBIA PLATEAU Left 12/26/2019   Procedure: OPEN REDUCTION INTERNAL FIXATION (ORIF) TIBIAL PLATEAU;  Surgeon: Shona Needles, MD;  Location: [place];  Service: Orthopedics;  Laterality: Left;  . SHOULDER ARTHROSCOPY WITH DEBRIDEMENT AND BICEP TENDON REPAIR Left 03/22/2019   Procedure: SHOULDER ARTHROSCOPY WITH DEBRIDEMENT, ROTATOR CUFF REPAIR, AND BICEP TENDON REPAIR;  Surgeon: Corky Mull, MD;  Location: [facility];  Service: Orthopedics;  Laterality: Left;  . SUBACROMIAL DECOMPRESSION  03/22/2019   Procedure: SUBACROMIAL DECOMPRESSION;  Surgeon: Corky Mull, MD;  Location: [facility];  Service: Orthopedics;;  . SURGERY FOR SEIZURES     nothing implanted in head. she was 54 years old. done [facility]  . TUBAL LIGATION  2003    Family Psychiatric History: I have reviewed family psychiatric history from my progress note on 11/04/2018  Family History:  Family History  Problem Relation Age of Onset  . Anxiety disorder Daughter     Social History: Reviewed social history from my progress note on 11/04/2018 Social History   Socioeconomic History  . Marital status: Divorced    Spouse name: Not on file  . Number of children: Not on file  . Years of education: Not on file  . Highest education level: Not on file  Occupational History  . Not on file  Tobacco Use  . Smoking status: Former Smoker    Packs/day: 0.25    Types: Cigarettes    Quit date: 08/17/2012    Years since quitting: 7.4  . Smokeless tobacco: Never Used  . Tobacco comment: pt states she smoked intermittently-smokers  in home  Substance and Sexual Activity  . Alcohol use: Yes    Comment: only on special occasions   . Drug use: No    Comment: denies  . Sexual activity: Not Currently  Other Topics Concern  . Not on file  Social History Narrative  . Not on file   Social Determinants of Health   Financial Resource Strain:   . Difficulty of Paying Living Expenses:   Food Insecurity:   . Worried About Charity fundraiser in the Last Year:   . Arboriculturist in the Last Year:   Transportation Needs:   . Film/video editor (Medical):   Marland Kitchen Lack of Transportation (Non-Medical):   Physical Activity:   . Days of Exercise per Week:   . Minutes of Exercise per Session:   Stress:   . Feeling of Stress :   Social Connections:   . Frequency of Communication with Friends and Family:   . Frequency of Social Gatherings with Friends and Family:   . Attends Religious Services:   .  Active Member of Clubs or Organizations:   . Attends Archivist Meetings:   Marland Kitchen Marital Status:     Allergies: No Known Allergies  Metabolic Disorder Labs: No results found for: HGBA1C, MPG No results found for: PROLACTIN No results found for: CHOL, TRIG, HDL, CHOLHDL, VLDL, LDLCALC Lab Results  Component Value Date   TSH 1.930 11/04/2018    Therapeutic Level Labs: No results found for: LITHIUM No results found for: VALPROATE No components found for:  CBMZ  Current Medications: Current Outpatient Medications  Medication Sig Dispense Refill  . aspirin EC 81 MG tablet Take 1 tablet (81 mg total) by mouth daily. 30 tablet 0  . Calcium Carb-Cholecalciferol (CALCIUM 600 + D PO) Take 1 tablet by mouth daily.    . cloBAZam (ONFI) 20 MG tablet Take 20 mg by mouth at bedtime.     . diclofenac (VOLTAREN) 75 MG EC tablet Take 75 mg by mouth 2 (two) times daily.    Marland Kitchen docusate sodium (COLACE) 100 MG capsule Take 1 capsule (100 mg total) by mouth 2 (two) times daily. 14 capsule 0  . enoxaparin (LOVENOX) 40 MG/0.4ML  injection Inject 0.4 mLs (40 mg total) into the skin daily. 12 mL 0  . Flaxseed, Linseed, (FLAXSEED OIL) 1000 MG CAPS Take 1,000 mg by mouth daily.    Marland Kitchen gabapentin (NEURONTIN) 100 MG capsule Take 1 capsule (100 mg total) by mouth 3 (three) times daily. 21 capsule 0  . lamoTRIgine (LAMICTAL) 150 MG tablet Take 150 mg by mouth 2 (two) times daily.     . Levetiracetam (KEPPRA XR) 750 MG TB24 Take 750 mg by mouth 2 (two) times daily.    Marland Kitchen levETIRAcetam (KEPPRA) 250 MG tablet     . methocarbamol (ROBAXIN) 500 MG tablet Take 1 tablet (500 mg total) by mouth every 6 (six) hours as needed for muscle spasms. 28 tablet 0  . Multiple Vitamins-Minerals (CENTRUM ADULTS PO) Take 1 tablet by mouth daily.     Marland Kitchen omeprazole (PRILOSEC) 20 MG capsule Take 20 mg by mouth every morning.     Marland Kitchen oxyCODONE (OXY IR/ROXICODONE) 5 MG immediate release tablet Take 1 tablet (5 mg total) by mouth every 6 (six) hours as needed for severe pain. 28 tablet 0  . pyridOXINE (VITAMIN B-6) 100 MG tablet Take 100 mg by mouth daily.     . sertraline (ZOLOFT) 100 MG tablet TAKE 1 TABLET BY MOUTH ONCE DAILY 21 tablet 2  . traMADol (ULTRAM) 50 MG tablet Take 50 mg by mouth daily as needed.    . Vitamin A 3 MG (10000 UT) TABS Take 3,000 Units by mouth daily.     . vitamin B-12 (CYANOCOBALAMIN) 500 MCG tablet Take 500 mcg by mouth daily.     . vitamin C (ASCORBIC ACID) 500 MG tablet Take 500 mg by mouth daily.    . Vitamin E 200 units TABS Take 200 Units by mouth daily.     Marland Kitchen zonisamide (ZONEGRAN) 100 MG capsule Take 400 mg by mouth daily with supper.     No current facility-administered medications for this visit.     Musculoskeletal: Strength & Muscle Tone: UTA Gait & Station: Observed as seated Patient leans: N/A  Psychiatric Specialty Exam: Review of Systems  Musculoskeletal:       Left LE fracture per report  Psychiatric/Behavioral: Positive for sleep disturbance.  All other systems reviewed and are negative.   There were  no vitals taken for this visit.There is no  height or weight on file to calculate BMI.  General Appearance: Casual  Eye Contact:  Fair  Speech:  Clear and Coherent  Volume:  Normal  Mood:  Euthymic  Affect:  Congruent  Thought Process:  Goal Directed and Descriptions of Associations: Intact  Orientation:  Full (Time, Place, and Person)  Thought Content: Logical   Suicidal Thoughts:  No  Homicidal Thoughts:  No  Memory:  Immediate;   Fair Recent;   Fair Remote;   Fair  Judgement:  Fair  Insight:  Fair  Psychomotor Activity:  Normal  Concentration:  Concentration: Fair and Attention Span: Fair  Recall:  [employer] of Knowledge: Fair  Language: Fair  Akathisia:  No  Handed:  Right  AIMS (if indicated):UTA  Assets:  Communication Skills Desire for Improvement Housing  ADL's:  Intact  Cognition: WNL  Sleep:  restless due to medical problems   Screenings: PHQ2-9     Office Visit from 05/23/2019 in [place]  PHQ-2 Total Score  2  PHQ-9 Total Score  6       Assessment and Plan: Cindy Mcclure is a 54 year old Caucasian female, widowed, lives in [place], has a history of seizure disorder, MDD, GAD, degenerative disc disease, chronic pain, recent fracture of lower extremity was evaluated by telemedicine today.  She is biologically predisposed given her multiple health issues, chronic pain and family history of mental health problems.  Patient with psychosocial stressors of recent medical issues.  She however is compliant on medications.  She is currently stable.  Plan as noted below.  Plan MDD in remission Zoloft 100 mg p.o. daily Continue CBT as needed.  GAD-stable Zoloft as prescribed  Follow-up in clinic in 6 to 8 weeks or sooner if needed.  I have spent atleast 15 minutes non face to face with patient today. More than 50 % of the time was spent for preparing to see the patient ( e.g., review of test, records ), ordering medications and  test ,psychoeducation and supportive psychotherapy and care coordination,as well as documenting clinical information in electronic health record. This note was generated in part or whole with voice recognition software. Voice recognition is usually quite accurate but there are transcription errors that can and very often do occur. I apologize for any typographical errors that were not detected and corrected.        Ursula Alert, MD 01/24/2020, 12:27 PM

## 2020-01-25 ENCOUNTER — Telehealth: Payer: Self-pay

## 2020-01-25 DIAGNOSIS — F411 Generalized anxiety disorder: Secondary | ICD-10-CM

## 2020-01-25 DIAGNOSIS — F3342 Major depressive disorder, recurrent, in full remission: Secondary | ICD-10-CM

## 2020-01-25 MED ORDER — SERTRALINE HCL 100 MG PO TABS: 100.0000 mg | ORAL_TABLET | Freq: Every day | ORAL | 2 refills | Status: DC

## 2020-01-25 NOTE — Telephone Encounter (Signed)
I have sent zoloft to pharmacy 

## 2020-01-25 NOTE — Telephone Encounter (Signed)
recevied fax requesting a 30 day supply of the sertraline 100mg  instead of 21 day supply.  sertraline (ZOLOFT) 100 MG tablet Medication Date: 01/19/2020 Department: [facility] Ordering/Authorizing: Ursula Alert, MD  Order Providers  Prescribing Provider Encounter Provider  Ursula Alert, MD Ursula Alert, MD  Outpatient Medication Detail   Disp Refills Start End   sertraline (ZOLOFT) 100 MG tablet 21 tablet 2 01/19/2020    Sig: TAKE 1 TABLET BY MOUTH ONCE DAILY   Sent to pharmacy as: sertraline (ZOLOFT) 100 MG tablet   E-Prescribing Status: Receipt confirmed by pharmacy (01/19/2020 4:41 PM EDT)

## 2020-01-26 ENCOUNTER — Other Ambulatory Visit: Payer: Self-pay

## 2020-01-26 ENCOUNTER — Ambulatory Visit (INDEPENDENT_AMBULATORY_CARE_PROVIDER_SITE_OTHER): Payer: 59 | Admitting: Licensed Clinical Social Worker

## 2020-01-26 DIAGNOSIS — F3341 Major depressive disorder, recurrent, in partial remission: Secondary | ICD-10-CM

## 2020-01-26 DIAGNOSIS — F411 Generalized anxiety disorder: Secondary | ICD-10-CM

## 2020-01-26 NOTE — Progress Notes (Signed)
Virtual Visit via Video Note  I connected with Cindy Mcclure on 01/26/20 at 10:00 AM EDT by a video enabled telemedicine application and verified that I am speaking with the correct person using two identifiers.  Location: Patient: inpatient--hospital  Provider: [place]   I discussed the limitations of evaluation and management by telemedicine and the availability of in person appointments. The patient expressed understanding and agreed to proceed.   I discussed the assessment and treatment plan with the patient. The patient was provided an opportunity to ask questions and all were answered. The patient agreed with the plan and demonstrated an understanding of the instructions.   The patient was advised to call back or seek an in-person evaluation if the symptoms worsen or if the condition fails to improve as anticipated.  I provided 30 minutes of non-face-to-face time during this encounter.   Cindy Mcclure R Cindy Dreese, [name]    THERAPIST PROGRESS NOTE  Session Time: 30  Participation Level: Active  Behavioral Response: NeatAlertstable affect  Type of Therapy: Individual Therapy  Treatment Goals addressed: Coping  Interventions: Supportive  Summary: Cindy Mcclure is a 54 y.o. female who presents with stress associated with a recent fall that resulted in multiple fractures.  Pt is currently admitted in hospital.  Allowed pt to share thoughts and feelings related to the accident, and discuss psychological impact.  Pt reports that family members have been very supportive and are visiting her. Pt denies any current depression or anxiety symptoms--feels she is managing well. Encouraged pt to engage in enjoyable activities throughout her hospital stay: pt reports that she enjoys watching tv, reading, and playing games occasionally on phone.      Suicidal/Homicidal: No  Therapist Response: Pt is continuing to work towards goal achievement. Pt reports a decrease in overall frequency and  intensity of anxiety symptoms, and reports a reduction in family conflict. Will continue working on overall self-management, emotion regulation, and maintenance of overall positive funtioning.   Plan: Return again in 4 weeks.  Diagnosis: Axis I: major depressive disorder, recurrent, in partial remission; generalized anxiety disorder    Axis II: No diagnosis    Rachel Bo Castiel Lauricella, [name] 01/26/2020

## 2020-02-23 ENCOUNTER — Other Ambulatory Visit: Payer: Self-pay

## 2020-02-23 ENCOUNTER — Ambulatory Visit (INDEPENDENT_AMBULATORY_CARE_PROVIDER_SITE_OTHER): Payer: 59 | Admitting: Licensed Clinical Social Worker

## 2020-02-23 DIAGNOSIS — F3341 Major depressive disorder, recurrent, in partial remission: Secondary | ICD-10-CM

## 2020-02-23 DIAGNOSIS — F411 Generalized anxiety disorder: Secondary | ICD-10-CM

## 2020-02-23 NOTE — Progress Notes (Signed)
Virtual Visit via Video Note  I connected with Cindy Mcclure on 02/23/20 at 11:00 AM EDT by a video enabled telemedicine application and verified that I am speaking with the correct person using two identifiers.  Location: Patient: home Provider: [facility]   I discussed the limitations of evaluation and management by telemedicine and the availability of in person appointments. The patient expressed understanding and agreed to proceed.   I discussed the assessment and treatment plan with the patient. The patient was provided an opportunity to ask questions and all were answered. The patient agreed with the plan and demonstrated an understanding of the instructions.   The patient was advised to call back or seek an in-person evaluation if the symptoms worsen or if the condition fails to improve as anticipated.  I provided 30 minutes of non-face-to-face time during this encounter.   Cindy Mcclure R Cindy Sermons, [name]    THERAPIST PROGRESS NOTE  Session Time: 11:00-11:30  Participation Level: Active  Behavioral Response: NeatAlert and LethargicDepressed  Type of Therapy: Individual Therapy  Treatment Goals addressed: Coping  Interventions: Supportive  Summary: Cindy Mcclure is a 54 y.o. female who presents with improving overall mood and stress levels.  Pt reporting mood is a 9 or 10 out of 10 (10 being best mood). Pt reporting situational stress--most stressful event is daughter moving out of the home to attend college.  Allowed pt to explore and express thoughts and feelings associated with her daughter leaving the home.  Discussed feelings pt has about being in the hospital for an extended period of time and having to leave daughter at home to do chores/maintain house while pt was incapacitated.   Pt feels overall health improving--eating well and resting well. Pt has several friends/family members that are coming to check on her. Pt feels well supported.  Pt has physical therapist and  occupational therapist coming into home for routine services. Pt doesn't always know when they are coming, so she has to get up and get moving early every day (pt states she doesn't mind this). Encouraged pt to stay as active as she physically can be and to focus on enjoyable activities throughout the day. Encouraged self care.   Suicidal/Homicidal: No  Therapist Response: Cindy Mcclure reports that mood is stable and that she only has situational stress. Pt recovering well from surgery with no ongoing chronic pain. Goals at this time are to maintain current mood and stress levels and prevent symptom relapse.   Plan: Return again in 5 weeks. [name] assisted with scheduling next OPT appointment.   Diagnosis: Axis I: Generalized Anxiety Disorder and Major Depression, Recurrent moderate in partial remission    Axis II: No diagnosis    Cindy Bo Lalita Ebel, [name] 02/23/2020

## 2020-03-28 ENCOUNTER — Ambulatory Visit (INDEPENDENT_AMBULATORY_CARE_PROVIDER_SITE_OTHER): Payer: 59 | Admitting: Licensed Clinical Social Worker

## 2020-03-28 ENCOUNTER — Other Ambulatory Visit: Payer: Self-pay

## 2020-03-28 DIAGNOSIS — F3341 Major depressive disorder, recurrent, in partial remission: Secondary | ICD-10-CM

## 2020-03-28 DIAGNOSIS — F411 Generalized anxiety disorder: Secondary | ICD-10-CM | POA: Diagnosis not present

## 2020-03-28 NOTE — Progress Notes (Signed)
Virtual Visit via Video Note  I connected with Cindy Mcclure on 03/28/20 at 11:00 AM EDT by a video enabled telemedicine application and verified that I am speaking with the correct person using two identifiers.  Location: Patient: home Provider: remote office [place], [place])   I discussed the limitations of evaluation and management by telemedicine and the availability of in person appointments. The patient expressed understanding and agreed to proceed.   The patient was advised to call back or seek an in-person evaluation if the symptoms worsen or if the condition fails to improve as anticipated.  I provided 35 minutes of non-face-to-face time during this encounter.   Cindy Zaragoza R Jeannelle Wiens, [name]   THERAPIST PROGRESS NOTE  Session Time: 11:00-11:35 am  Participation Level: Active  Behavioral Response: NAAlertAnxious  Type of Therapy: Individual Therapy  Treatment Goals addressed: Anxiety  Interventions: Supportive and Reframing  Summary: Cindy Mcclure is a 54 y.o. female who presents with continuing symptoms related to her diagnoses (depression, anxiety). Pt reports that mood is continuing to be stable but stress/anxiety is a bit higher for situational reasons. Pt having occasional insomnia.  Allowed pt to explore and express thoughts and feelings about her recovery process--pt is feeling that her daughter has not been committed to helping her through the process enough.  Pt also feels that her mother was more supportive initially, but has recently not been there to help as much.  Pt expressed mixed feelings about daughter leaving for college--is very excited about her continuing her education, but is worried about being home alone. Pt reports that self-care is still a struggle (personal hygiene; some housework). Pt is trying very hard to do everything on her own.  Encouraged pt to make sure to keep activity in balance and encouraged to prioritize self care and  rest/relaxation activities.   Suicidal/Homicidal: No  Therapist Response: Cindy Mcclure was very engaged throughout session. Cindy Mcclure is reporting an increase in overall stress and anxiety due to exposure to external stressors. This is indicative of fluctuating/intermittent progress.   Plan: Return again in 4 weeks. Ongoing treatment plan to include managing mood, stress, and anxiety.   Diagnosis: Axis I: Generalized Anxiety Disorder and Major Depression, Recurrent, in partial remission     Axis II: No diagnosis    Cindy Mcclure Cindy Isaacson, [name] 03/28/2020

## 2020-04-12 ENCOUNTER — Telehealth (INDEPENDENT_AMBULATORY_CARE_PROVIDER_SITE_OTHER): Payer: 59 | Admitting: Psychiatry

## 2020-04-12 ENCOUNTER — Other Ambulatory Visit: Payer: Self-pay

## 2020-04-12 ENCOUNTER — Encounter: Payer: Self-pay | Admitting: Psychiatry

## 2020-04-12 DIAGNOSIS — F411 Generalized anxiety disorder: Secondary | ICD-10-CM

## 2020-04-12 DIAGNOSIS — F3342 Major depressive disorder, recurrent, in full remission: Secondary | ICD-10-CM | POA: Diagnosis not present

## 2020-04-12 MED ORDER — SERTRALINE HCL 100 MG PO TABS: 100.0000 mg | ORAL_TABLET | Freq: Every day | ORAL | 2 refills | Status: DC

## 2020-04-12 NOTE — Progress Notes (Signed)
Provider Location : [facility] Patient Location : Home  Participants: Patient , Provider  Virtual Visit via Video Note  I connected with Cindy Mcclure on 04/12/20 at  2:40 PM EDT by a video enabled telemedicine application and verified that I am speaking with the correct person using two identifiers.   I discussed the limitations of evaluation and management by telemedicine and the availability of in person appointments. The patient expressed understanding and agreed to proceed.   I discussed the assessment and treatment plan with the patient. The patient was provided an opportunity to ask questions and all were answered. The patient agreed with the plan and demonstrated an understanding of the instructions.   The patient was advised to call back or seek an in-person evaluation if the symptoms worsen or if the condition fails to improve as anticipated.  [place] MD OP Progress Note  04/12/2020 5:53 PM Cindy Mcclure  MRN:  [id number]  Chief Complaint:  Chief Complaint    Follow-up     HPI: Cindy Mcclure is a 54 year old Caucasian female on disability, lives in [place], has a history of MDD, GAD, seizure disorder, urinary incontinence, degenerative disc disease was evaluated by telemedicine today.  Patient today reports she is currently making progress.  [facility]  She reports her fractured leg is currently getting better.  She is able to walk better.  She denies any pain.  She reports she got a lot of support from her mother as well as daughter during her recovery.  Her mother continues to support her since her daughter is currently in school.  Patient reports she is compliant on her Zoloft.  She denies side effects.  Patient continues to be in psychotherapy sessions and reports therapy sessions as beneficial.  Patient denies any suicidality, homicidality or perceptual disturbances.  Patient denies any other concerns  today.  Visit Diagnosis:    ICD-10-CM   1. MDD (major depressive disorder), recurrent, in full remission ([place])  F33.42 sertraline (ZOLOFT) 100 MG tablet  2. GAD (generalized anxiety disorder)  F41.1 sertraline (ZOLOFT) 100 MG tablet    Past Psychiatric History: I have reviewed past psychiatric history from my progress note on 11/04/2018  Past Medical History:  Past Medical History:  Diagnosis Date  . Allergic rhinitis   . Anginal pain [facility])    sees dr Clayborn Bigness  . Anxiety   . Arthritis    all over...knees, ankles, back  . Back pain   . Cervical radiculitis    no surgery, just cortisone shots.    . Constipation   . Constipation   . DDD (degenerative disc disease), cervical   . Dermatophytosis of nail 2019   both feet  . GERD (gastroesophageal reflux disease)   . History of kidney stones    h/o  . Osteoarthritis   . Panic attacks   . Plantar fasciitis    bilaterally  . Seizures ([place]) 1972   unsure of when she had last seizure due to petit mal type. does not know what brings on a seizure.  . Stress bladder incontinence, female   . Urinary incontinence     Past Surgical History:  Procedure Laterality Date  . CESAREAN SECTION  [id number]  . CHOLECYSTECTOMY N/A 12/06/2015   Procedure: LAPAROSCOPIC CHOLECYSTECTOMY WITH INTRAOPERATIVE CHOLANGIOGRAM;  Surgeon: Dia Crawford III, MD;  Location: [facility] ORS;  Service: General;  Laterality: N/A;  . COLONOSCOPY WITH PROPOFOL N/A 09/29/2016   Procedure: COLONOSCOPY WITH PROPOFOL;  Surgeon: Manya Silvas, MD;  Location: [facility] ENDOSCOPY;  Service: Endoscopy;  Laterality: N/A;  . EXTERNAL FIXATION LEG Left 12/22/2019   Procedure: External fixation left spanning knee. CPT 20690 uniplane;  Surgeon: Leandrew Koyanagi, MD;  Location: [place];  Service: Orthopedics;  Laterality: Left;  . EXTERNAL FIXATION REMOVAL Left 12/26/2019   Procedure: Removal External Fixation Leg;  Surgeon: Shona Needles, MD;  Location: [place];  Service: Orthopedics;  Laterality:  Left;  . KNEE ARTHROSCOPY WITH EXCISION PLICA Left 9/74/1638   Procedure: KNEE ARTHROSCOPY WITH EXCISION PLICA, PARTIAL SYNOVECTOMY;  Surgeon: Hessie Knows, MD;  Location: [facility] ORS;  Service: Orthopedics;  Laterality: Left;  . KNEE ARTHROSCOPY WITH MEDIAL MENISECTOMY Right 09/03/2017   Procedure: KNEE ARTHROSCOPY WITH MEDIAL MENISECTOMY, PARTIAL SYNOVECTOMY;  Surgeon: Hessie Knows, MD;  Location: [facility] ORS;  Service: Orthopedics;  Laterality: Right;  . ORIF CALCANEOUS FRACTURE Right 12/26/2019   Procedure: OPEN REDUCTION INTERNAL FIXATION (ORIF) CALCANEOUS FRACTURE;  Surgeon: Shona Needles, MD;  Location: [place];  Service: Orthopedics;  Laterality: Right;  . ORIF TIBIA PLATEAU Left 12/26/2019   Procedure: OPEN REDUCTION INTERNAL FIXATION (ORIF) TIBIAL PLATEAU;  Surgeon: Shona Needles, MD;  Location: [place];  Service: Orthopedics;  Laterality: Left;  . SHOULDER ARTHROSCOPY WITH DEBRIDEMENT AND BICEP TENDON REPAIR Left 03/22/2019   Procedure: SHOULDER ARTHROSCOPY WITH DEBRIDEMENT, ROTATOR CUFF REPAIR, AND BICEP TENDON REPAIR;  Surgeon: Corky Mull, MD;  Location: [facility]S;  Service: Orthopedics;  Laterality: Left;  . SUBACROMIAL DECOMPRESSION  03/22/2019   Procedure: SUBACROMIAL DECOMPRESSION;  Surgeon: Corky Mull, MD;  Location: [facility] ORS;  Service: Orthopedics;;  . SURGERY FOR SEIZURES     nothing implanted in head. she was 54 years old. done [facility]  . TUBAL LIGATION  2003    Family Psychiatric History: I have reviewed family psychiatric history from my progress note on 11/04/2018  Family History:  Family History  Problem Relation Age of Onset  . Anxiety disorder Daughter     Social History: Reviewed social history from my progress note on 11/04/2018 Social History   Socioeconomic History  . Marital status: Divorced    Spouse name: Not on file  . Number of children: Not on file  . Years of education: Not on file  . Highest education level: Not on file  Occupational History  . Not on  file  Tobacco Use  . Smoking status: Former Smoker    Packs/day: 0.25    Types: Cigarettes    Quit date: 08/17/2012    Years since quitting: 7.6  . Smokeless tobacco: Never Used  . Tobacco comment: pt states she smoked intermittently-smokers in home  Vaping Use  . Vaping Use: Never used  Substance and Sexual Activity  . Alcohol use: Yes    Comment: only on special occasions   . Drug use: No    Comment: denies  . Sexual activity: Not Currently  Other Topics Concern  . Not on file  Social History Narrative  . Not on file   Social Determinants of Health   Financial Resource Strain:   . Difficulty of Paying Living Expenses: Not on file  Food Insecurity:   . Worried About Charity fundraiser in the Last Year: Not on file  . Ran Out of Food in the Last Year: Not on file  Transportation Needs:   . Lack of Transportation (Medical): Not on file  . Lack of Transportation (Non-Medical): Not on file  Physical Activity:   .  Days of Exercise per Week: Not on file  . Minutes of Exercise per Session: Not on file  Stress:   . Feeling of Stress : Not on file  Social Connections:   . Frequency of Communication with Friends and Family: Not on file  . Frequency of Social Gatherings with Friends and Family: Not on file  . Attends Religious Services: Not on file  . Active Member of Clubs or Organizations: Not on file  . Attends Archivist Meetings: Not on file  . Marital Status: Not on file    Allergies: No Known Allergies  Metabolic Disorder Labs: No results found for: HGBA1C, MPG No results found for: PROLACTIN No results found for: CHOL, TRIG, HDL, CHOLHDL, VLDL, LDLCALC Lab Results  Component Value Date   TSH 1.930 11/04/2018    Therapeutic Level Labs: No results found for: LITHIUM No results found for: VALPROATE No components found for:  CBMZ  Current Medications: Current Outpatient Medications  Medication Sig Dispense Refill  . Calcium Carb-Cholecalciferol  (CALCIUM 600 + D PO) Take 1 tablet by mouth daily.    . cloBAZam (ONFI) 20 MG tablet Take 20 mg by mouth at bedtime.     . diclofenac (VOLTAREN) 75 MG EC tablet Take 75 mg by mouth 2 (two) times daily.    . diclofenac Sodium (VOLTAREN) 1 % GEL SMARTSIG:1 Sparingly Topical 4 Times Daily PRN    . docusate sodium (COLACE) 100 MG capsule Take 1 capsule (100 mg total) by mouth 2 (two) times daily. 14 capsule 0  . enoxaparin (LOVENOX) 40 MG/0.4ML injection Inject 0.4 mLs (40 mg total) into the skin daily. 12 mL 0  . Flaxseed, Linseed, (FLAXSEED OIL) 1000 MG CAPS Take 1,000 mg by mouth daily.    Marland Kitchen gabapentin (NEURONTIN) 100 MG capsule Take 1 capsule (100 mg total) by mouth 3 (three) times daily. 21 capsule 0  . lamoTRIgine (LAMICTAL) 150 MG tablet Take 150 mg by mouth 2 (two) times daily.     . Levetiracetam (KEPPRA XR) 750 MG TB24 Take 750 mg by mouth 2 (two) times daily.    Marland Kitchen levETIRAcetam (KEPPRA) 250 MG tablet     . methocarbamol (ROBAXIN) 500 MG tablet Take 1 tablet (500 mg total) by mouth every 6 (six) hours as needed for muscle spasms. 28 tablet 0  . Multiple Vitamins-Minerals (CENTRUM ADULTS PO) Take 1 tablet by mouth daily.     Marland Kitchen omeprazole (PRILOSEC) 20 MG capsule Take 20 mg by mouth every morning.     Marland Kitchen oxyCODONE (OXY IR/ROXICODONE) 5 MG immediate release tablet Take 1 tablet (5 mg total) by mouth every 6 (six) hours as needed for severe pain. 28 tablet 0  . pyridOXINE (VITAMIN B-6) 100 MG tablet Take 100 mg by mouth daily.     . sertraline (ZOLOFT) 100 MG tablet Take 1 tablet (100 mg total) by mouth daily. 30 tablet 2  . traMADol (ULTRAM) 50 MG tablet Take 50 mg by mouth daily as needed.    . Vitamin A 3 MG (10000 UT) TABS Take 3,000 Units by mouth daily.     . vitamin B-12 (CYANOCOBALAMIN) 500 MCG tablet Take 500 mcg by mouth daily.     . vitamin C (ASCORBIC ACID) 500 MG tablet Take 500 mg by mouth daily.    . Vitamin E 200 units TABS Take 200 Units by mouth daily.     Marland Kitchen zonisamide  (ZONEGRAN) 100 MG capsule Take 400 mg by mouth daily with supper.  No current facility-administered medications for this visit.     Musculoskeletal: Strength & Muscle Tone: UTA Gait & Station: normal Patient leans: N/A  Psychiatric Specialty Exam: Review of Systems  Psychiatric/Behavioral: Negative for agitation, behavioral problems, confusion, decreased concentration, dysphoric mood, hallucinations, self-injury, sleep disturbance and suicidal ideas. The patient is not nervous/anxious and is not hyperactive.   All other systems reviewed and are negative.   There were no vitals taken for this visit.There is no height or weight on file to calculate BMI.  General Appearance: Casual  Eye Contact:  Fair  Speech:  Normal Rate  Volume:  Normal  Mood:  Euthymic  Affect:  Congruent  Thought Process:  Goal Directed and Descriptions of Associations: Intact  Orientation:  Full (Time, Place, and Person)  Thought Content: Logical   Suicidal Thoughts:  No  Homicidal Thoughts:  No  Memory:  Immediate;   Fair Recent;   Fair Remote;   Fair  Judgement:  Fair  Insight:  Fair  Psychomotor Activity:  Normal  Concentration:  Concentration: Fair and Attention Span: Fair  Recall:  [employer] of Knowledge: Fair  Language: Fair  Akathisia:  No  Handed:  Right  AIMS (if indicated): UTA  Assets:  Communication Skills Desire for Improvement Housing Social Support  ADL's:  Intact  Cognition: WNL  Sleep:  Fair   Screenings: PHQ2-9     Office Visit from 05/23/2019 in [facility]  PHQ-2 Total Score 2  PHQ-9 Total Score 6       Assessment and Plan: ZILDA NO is a 54 year old Caucasian female, widowed, lives in [place], has a history of seizure disorder, MDD, GAD, degenerative disc disease, chronic pain, recent fracture of lower extremity currently healing was evaluated by telemedicine today.  Patient is biologically predisposed given her multiple health  issues, chronic pain, family history of mental health problems.  Patient with psychosocial stressors of relationship struggles, recent health issues.  She however is currently making progress and reports good social support system.  Plan as noted below.  Plan MDD in remission Zoloft 100 mg p.o. daily Continue CBT as needed  GAD-stable Zoloft as prescribed  Follow-up in clinic in 8 weeks or sooner if needed.  I have spent atleast 20 minutes  face to face with patient today. More than 50 % of the time was spent for  ordering medications and test ,psychoeducation and supportive psychotherapy and care coordination,as well as documenting clinical information in electronic health record. This note was generated in part or whole with voice recognition software. Voice recognition is usually quite accurate but there are transcription errors that can and very often do occur. I apologize for any typographical errors that were not detected and corrected.        Ursula Alert, MD 04/13/2020, 7:53 AM

## 2020-05-02 ENCOUNTER — Other Ambulatory Visit: Payer: Self-pay

## 2020-05-02 ENCOUNTER — Ambulatory Visit (INDEPENDENT_AMBULATORY_CARE_PROVIDER_SITE_OTHER): Payer: 59 | Admitting: Licensed Clinical Social Worker

## 2020-05-02 DIAGNOSIS — F3342 Major depressive disorder, recurrent, in full remission: Secondary | ICD-10-CM

## 2020-05-02 DIAGNOSIS — F411 Generalized anxiety disorder: Secondary | ICD-10-CM

## 2020-05-02 NOTE — Progress Notes (Signed)
Virtual Visit via Video Note  I connected with Cindy Mcclure on 05/02/20 at 12:30 PM EDT by a video enabled telemedicine application and verified that I am speaking with the correct person using two identifiers.  Location: Patient: home Provider: remote office [place], [place])   I discussed the limitations of evaluation and management by telemedicine and the availability of in person appointments. The patient expressed understanding and agreed to proceed.  The patient was advised to call back or seek an in-person evaluation if the symptoms worsen or if the condition fails to improve as anticipated.  I provided 30 minutes of non-face-to-face time during this encounter.   [name] [name], [name]    THERAPIST PROGRESS NOTE  Session Time: 12:30-12:50 p  Participation Level: Active  Behavioral Response: Neat and Well GroomedAlertEuthymic  Type of Therapy: Individual Therapy  Treatment Goals addressed: Coping  Interventions: Supportive  Summary: Cindy Mcclure is a 54 y.o. female who presents with improving symptoms related to her diagnoses (depression, anxiety).  Pt reports mood is stable, good medication compliance, managing stress/anxiety better, and good quality and quantity of sleep.  Allowed pt to express thoughts and feelings associated with external stressors: pt happy that daughter is coming to visit on the weekends from college.  Initially pt had a lot of anxiety about daughter being gone from the home--feels like this is subsiding. Pt reports that she is being active and engaging socially with others. Pt reports a decrease in health-related anxiety now that she is healing from injury.   Continued to encourage pt to be intentional about self care and life balance. Goal is to maintain progress and avoid relapse of symptoms.  Suicidal/Homicidal: No  SI, HI, or AVH reported at time of session.  Therapist Response: Treatment continues to show good evolution and  development. Continue focus on maintaining progress and preventing symptom relapse.  Plan: Return again in 8 weeks.  Diagnosis: Axis I: Generalized Anxiety Disorder and Major Depression, Recurrent in remission    Axis II: No diagnosis    [name], [name] 05/02/2020

## 2020-05-17 ENCOUNTER — Encounter (HOSPITAL_COMMUNITY): Payer: Self-pay | Admitting: Student

## 2020-05-17 ENCOUNTER — Other Ambulatory Visit
Admission: RE | Admit: 2020-05-17 | Discharge: 2020-05-17 | Disposition: A | Discharge status: Home / Self Care | Payer: 59 | Source: Ambulatory Visit | Attending: Student | Admitting: Student

## 2020-05-17 ENCOUNTER — Other Ambulatory Visit: Payer: Self-pay

## 2020-05-17 DIAGNOSIS — Z01812 Encounter for preprocedural laboratory examination: Secondary | ICD-10-CM | POA: Diagnosis present

## 2020-05-17 DIAGNOSIS — Z20822 Contact with and (suspected) exposure to covid-19: Secondary | ICD-10-CM | POA: Insufficient documentation

## 2020-05-17 LAB — SARS CORONAVIRUS 2 (TAT 6-24 HRS): SARS Coronavirus 2: NEGATIVE

## 2020-05-17 NOTE — Progress Notes (Signed)
Chest x-ray - 12/23/19 EKG - 12/28/19 Stress Test - 03/30/18 ECHO - 03/2018 Cardiac Cath - Denies  Sleep Study - Denies  Pt denies being diabetic.  Blood Thinner Instructions: Lovenox in med list. Pt states that she is administering Lovenox injections.    ERAS Protcol - No  COVID TEST- 05/17/20   Patient denies shortness of breath, fever, cough and chest pain at PAT appointment   All instructions explained to the patient, with a verbal understanding of the material. Patient also instructed to self quarantine after being tested for COVID-19. The opportunity to ask questions was provided.

## 2020-05-20 ENCOUNTER — Encounter (HOSPITAL_COMMUNITY): Payer: Self-pay | Admitting: Student

## 2020-05-20 NOTE — H&P (Signed)
Orthopaedic Trauma Service (OTS) H&P  Patient ID: AMORITA VANROSSUM MRN: [id number] DOB/AGE: [birth year] 54 y.o.  Reason for Surgery: Hardware removal left proximal tibia  HPI: Cindy Mcclure is an 54 y.o. female presenting for hardware removal from left proximal tibia. In May 2021, patient fell from an 8 foot ladder, landing on her bilateral lower extremities.  She presented to the emergency room where x-rays showed a significantly displaced left bicondylar tibial plateau fracture and a right intra-articular calcaneus fracture with distal fibula fracture on the right. She underwent surgery for her injuries with Dr. Doreatha Martin on 12/26/19. Over the last 5 months patient has followed up in OTS clinic at regular intervals. Her left tibial plateau fracture has unfortunately been slow to heal and she is currently using a bone stimulator. Over the last month or so, patient has noted prominence of one of the proximal screws that continues to worsen. This has becoming irritating for her and she is now requesting removal of the offending screw.   Past Medical History:  Diagnosis Date  . Allergic rhinitis   . Anginal pain [facility])    sees dr Clayborn Bigness  . Anxiety   . Arthritis    all over...knees, ankles, back  . Back pain   . Cervical radiculitis    no surgery, just cortisone shots.    . Constipation   . Constipation   . DDD (degenerative disc disease), cervical   . Dermatophytosis of nail 2019   both feet  . GERD (gastroesophageal reflux disease)   . History of kidney stones    h/o  . Osteoarthritis   . Panic attacks   . Plantar fasciitis    bilaterally  . Seizures ([place]) 1972   unsure of when she had last seizure due to petit mal type. does not know what brings on a seizure.  . Stress bladder incontinence, female   . Urinary incontinence     Past Surgical History:  Procedure Laterality Date  . APPENDECTOMY    . CESAREAN SECTION  [id number]  . CHOLECYSTECTOMY N/A 12/06/2015   Procedure:  LAPAROSCOPIC CHOLECYSTECTOMY WITH INTRAOPERATIVE CHOLANGIOGRAM;  Surgeon: Dia Crawford III, MD;  Location: [facility] ORS;  Service: General;  Laterality: N/A;  . COLONOSCOPY WITH PROPOFOL N/A 09/29/2016   Procedure: COLONOSCOPY WITH PROPOFOL;  Surgeon: Manya Silvas, MD;  Location: [facility] ENDOSCOPY;  Service: Endoscopy;  Laterality: N/A;  . EXTERNAL FIXATION LEG Left 12/22/2019   Procedure: External fixation left spanning knee. CPT 20690 uniplane;  Surgeon: Leandrew Koyanagi, MD;  Location: [place];  Service: Orthopedics;  Laterality: Left;  . EXTERNAL FIXATION REMOVAL Left 12/26/2019   Procedure: Removal External Fixation Leg;  Surgeon: Shona Needles, MD;  Location: [place];  Service: Orthopedics;  Laterality: Left;  . KNEE ARTHROSCOPY WITH EXCISION PLICA Left 12/04/6220   Procedure: KNEE ARTHROSCOPY WITH EXCISION PLICA, PARTIAL SYNOVECTOMY;  Surgeon: Hessie Knows, MD;  Location: [facility] ORS;  Service: Orthopedics;  Laterality: Left;  . KNEE ARTHROSCOPY WITH MEDIAL MENISECTOMY Right 09/03/2017   Procedure: KNEE ARTHROSCOPY WITH MEDIAL MENISECTOMY, PARTIAL SYNOVECTOMY;  Surgeon: Hessie Knows, MD;  Location: [facility] ORS;  Service: Orthopedics;  Laterality: Right;  . ORIF CALCANEOUS FRACTURE Right 12/26/2019   Procedure: OPEN REDUCTION INTERNAL FIXATION (ORIF) CALCANEOUS FRACTURE;  Surgeon: Shona Needles, MD;  Location: [place];  Service: Orthopedics;  Laterality: Right;  . ORIF TIBIA PLATEAU Left 12/26/2019   Procedure: OPEN REDUCTION INTERNAL FIXATION (ORIF) TIBIAL PLATEAU;  Surgeon: Shona Needles, MD;  Location: [place];  Service: Orthopedics;  Laterality: Left;  . SHOULDER ARTHROSCOPY WITH DEBRIDEMENT AND BICEP TENDON REPAIR Left 03/22/2019   Procedure: SHOULDER ARTHROSCOPY WITH DEBRIDEMENT, ROTATOR CUFF REPAIR, AND BICEP TENDON REPAIR;  Surgeon: Corky Mull, MD;  Location: [facility] ORS;  Service: Orthopedics;  Laterality: Left;  . SUBACROMIAL DECOMPRESSION  03/22/2019   Procedure: SUBACROMIAL DECOMPRESSION;  Surgeon: Corky Mull, MD;  Location: [facility] ORS;  Service: Orthopedics;;  . SURGERY FOR SEIZURES     nothing implanted in head. she was 54 years old. done at [facility]  . TONSILLECTOMY    . TUBAL LIGATION  2003    Family History  Problem Relation Age of Onset  . Anxiety disorder Daughter     Social History:  reports that she quit smoking about 7 years ago. Her smoking use included cigarettes. She smoked 0.25 packs per day. She has never used smokeless tobacco. She reports current alcohol use. She reports that she does not use drugs.  Allergies: No Known Allergies  Medications:  I have reviewed the patient's current medications. Prior to Admission:  No medications prior to admission.    ROS: Constitutional: No fever or chills Vision: No changes in vision ENT: No difficulty swallowing CV: No chest pain Pulm: No SOB or wheezing GI: No nausea or vomiting GU: No urgency or inability to hold urine Skin: No poor wound healing Neurologic: No numbness or tingling Psychiatric: No depression or anxiety Heme: No bruising Allergic: No reaction to medications or food   Exam: Weight 75.8 kg. General: NAD Orientation: Alert and oriented x4 Mood and Affect: Mood and affect appropriate, pleasant and cooperative Gait: Normal, reciprocal gait Coordination and balance: Within normal limits  LLE: Well healed surgical incision. Prominence of a proximal screw felt over lateral portion of the proximal tibia. No significant tenderness with palpation throughout remainder of leg. Knee flexion/extension without significant limitations or pain. Sensation intact to light touch throughout extremity. Neurovascularly intact.  RLE: Well healed incision lateral heel. No signifcant tenderness to palpation throughout extremity. Full painless ROM of the knee and ankle. Full strength in each muscle group without evidence of instability. Motor and sensory function intact. Neurovascularly intact   Medical Decision  Making: Data: Imaging: AP and lateral views of the left knee shows lateral plate and screws in position. One of the proximal screws has backed out of the bone a significant amount. No other signs of hardware failure or loosening. Fracture remains in appropriate alignment with some callus formation on the medial cortex, but not fully bridging callus at this time  Labs:  Results for orders placed or performed during the hospital encounter of 05/17/20 (from the past 168 hour(s))  SARS CORONAVIRUS 2 (TAT 6-24 HRS) Nasopharyngeal Nasopharyngeal Swab   Collection Time: 05/17/20 10:54 AM   Specimen: Nasopharyngeal Swab  Result Value Ref Range   SARS Coronavirus 2 NEGATIVE NEGATIVE    Assessment/Plan: 54 year old female status post ORIF left tibial plateau fracture on 12/26/2019 now presenting for removal of hardware due to loosening/prominence.  Patient benefits of the procedure were discussed with the patient and her family. Risks discussed included bleeding, infection, damage to surrounding nerves and blood vessels, pain, continuing to have hardware prominence or irritation from remaining hardware, hardware failure, stiffness, DVT/PE, compartment syndrome, and anesthesia complications.  Patient states understanding of these risks and agrees to proceed with surgery.  Consent will be obtained.   Emory Gallentine A. Carmie [facility] [phone] (office) [email or web address]

## 2020-05-20 NOTE — Anesthesia Preprocedure Evaluation (Addendum)
Anesthesia Evaluation  Patient identified by MRN, date of birth, ID band Patient awake    Reviewed: Allergy & Precautions, NPO status , Patient's Chart, lab work & pertinent test results  History of Anesthesia Complications Negative for: history of anesthetic complications  Airway Mallampati: II  TM Distance: >3 FB Neck ROM: Full    Dental  (+) Teeth Intact, Dental Advisory Given   Pulmonary former smoker,    Pulmonary exam normal breath sounds clear to auscultation       Cardiovascular Exercise Tolerance: Good (-) hypertension(-) CAD, (-) Past MI and (-) Cardiac Stents  Rhythm:Regular Rate:Tachycardia  Sinus tachycardia Nonspecific T wave abnormality Nonspecific T wave abnormality is new since 22-Dec-2019 Confirmed by Croitoru, Mihai [phone]) on 12/29/2019 11:25:37 AM  ECHO 04/08/18: INTERPRETATION  NORMAL LEFT VENTRICULAR SYSTOLIC FUNCTION WITH AN ESTIMATED EF = >55 %  NORMAL RIGHT VENTRICULAR SYSTOLIC FUNCTION  MILD-TO-MODERATE MITRAL VALVE INSUFFICIENCY  MILD TRICUSPID VALVE INSUFFICIENCY  NO VALVULAR STENOSIS     Neuro/Psych Seizures - (distant history), Well Controlled,  PSYCHIATRIC DISORDERS Anxiety Depression  Neuromuscular disease    GI/Hepatic Neg liver ROS, GERD  Medicated,  Endo/Other  Obesity   Renal/GU negative Renal ROS Bladder dysfunction      Musculoskeletal  (+) Arthritis , Osteoarthritis,    Abdominal   Peds negative pediatric ROS (+)  Hematology  (+) Blood dyscrasia, anemia ,   Anesthesia Other Findings Day of surgery medications reviewed with the patient.  Reproductive/Obstetrics negative OB ROS                            Anesthesia Physical  Anesthesia Plan  ASA: III  Anesthesia Plan: General   Post-op Pain Management:    Induction: Intravenous  PONV Risk Score and Plan: 3 and Midazolam, Dexamethasone and Ondansetron  Airway Management Planned:  LMA  Additional Equipment:   Intra-op Plan:   Post-operative Plan: Extubation in OR  Informed Consent: I have reviewed the patients History and Physical, chart, labs and discussed the procedure including the risks, benefits and alternatives for the proposed anesthesia with the patient or authorized representative who has indicated his/her understanding and acceptance.       Plan Discussed with: CRNA and Anesthesiologist  Anesthesia Plan Comments:        Anesthesia Quick Evaluation

## 2020-05-21 ENCOUNTER — Telehealth: Payer: 59 | Admitting: Psychiatry

## 2020-05-21 ENCOUNTER — Other Ambulatory Visit: Payer: Self-pay

## 2020-05-21 ENCOUNTER — Ambulatory Visit (HOSPITAL_COMMUNITY): Payer: 59

## 2020-05-21 ENCOUNTER — Ambulatory Visit (HOSPITAL_COMMUNITY)
Admission: RE | Admit: 2020-05-21 | Discharge: 2020-05-21 | Disposition: A | Discharge status: Home / Self Care | Payer: 59 | Attending: Student | Admitting: Student

## 2020-05-21 ENCOUNTER — Encounter (HOSPITAL_COMMUNITY): Payer: Self-pay | Admitting: Student

## 2020-05-21 ENCOUNTER — Encounter (HOSPITAL_COMMUNITY)
Admission: RE | Disposition: A | Discharge status: Home / Self Care | Payer: Self-pay | Source: Home / Self Care | Attending: Student

## 2020-05-21 ENCOUNTER — Ambulatory Visit (HOSPITAL_COMMUNITY): Payer: 59 | Admitting: Anesthesiology

## 2020-05-21 DIAGNOSIS — S82142K Displaced bicondylar fracture of left tibia, subsequent encounter for closed fracture with nonunion: Secondary | ICD-10-CM | POA: Insufficient documentation

## 2020-05-21 DIAGNOSIS — Z6834 Body mass index (BMI) 34.0-34.9, adult: Secondary | ICD-10-CM | POA: Diagnosis not present

## 2020-05-21 DIAGNOSIS — W11XXXD Fall on and from ladder, subsequent encounter: Secondary | ICD-10-CM | POA: Insufficient documentation

## 2020-05-21 DIAGNOSIS — T84127A Displacement of internal fixation device of bone of left lower leg, initial encounter: Secondary | ICD-10-CM | POA: Diagnosis present

## 2020-05-21 DIAGNOSIS — Y831 Surgical operation with implant of artificial internal device as the cause of abnormal reaction of the patient, or of later complication, without mention of misadventure at the time of the procedure: Secondary | ICD-10-CM | POA: Diagnosis not present

## 2020-05-21 DIAGNOSIS — Z419 Encounter for procedure for purposes other than remedying health state, unspecified: Secondary | ICD-10-CM

## 2020-05-21 DIAGNOSIS — M1990 Unspecified osteoarthritis, unspecified site: Secondary | ICD-10-CM | POA: Insufficient documentation

## 2020-05-21 DIAGNOSIS — Z87891 Personal history of nicotine dependence: Secondary | ICD-10-CM | POA: Diagnosis not present

## 2020-05-21 DIAGNOSIS — E669 Obesity, unspecified: Secondary | ICD-10-CM | POA: Diagnosis not present

## 2020-05-21 HISTORY — PX: HARDWARE REMOVAL: SHX979

## 2020-05-21 LAB — CBC
HCT: 39.2 % (ref 36.0–46.0)
Hemoglobin: 12.2 g/dL (ref 12.0–15.0)
MCH: 29.5 pg (ref 26.0–34.0)
MCHC: 31.1 g/dL (ref 30.0–36.0)
MCV: 94.9 fL (ref 80.0–100.0)
Platelets: 358 10*3/uL (ref 150–400)
RBC: 4.13 MIL/uL (ref 3.87–5.11)
RDW: 14.5 % (ref 11.5–15.5)
WBC: 8.4 10*3/uL (ref 4.0–10.5)
nRBC: 0 % (ref 0.0–0.2)

## 2020-05-21 SURGERY — REMOVAL, HARDWARE
Anesthesia: General | Laterality: Left

## 2020-05-21 MED ORDER — PHENYLEPHRINE HCL-NACL 10-0.9 MG/250ML-% IV SOLN
INTRAVENOUS | Status: AC
  Filled 2020-05-21: qty 250

## 2020-05-21 MED ORDER — PROPOFOL 10 MG/ML IV BOLUS
INTRAVENOUS | Status: DC | PRN
  Administered 2020-05-21: 150 mg via INTRAVENOUS

## 2020-05-21 MED ORDER — FENTANYL CITRATE (PF) 250 MCG/5ML IJ SOLN
INTRAMUSCULAR | Status: AC
  Filled 2020-05-21: qty 5

## 2020-05-21 MED ORDER — CHLORHEXIDINE GLUCONATE 0.12 % MT SOLN
OROMUCOSAL | Status: AC
  Administered 2020-05-21: 15 mL
  Filled 2020-05-21: qty 15

## 2020-05-21 MED ORDER — DEXAMETHASONE SODIUM PHOSPHATE 10 MG/ML IJ SOLN
INTRAMUSCULAR | Status: AC
  Filled 2020-05-21: qty 1

## 2020-05-21 MED ORDER — CEFAZOLIN SODIUM-DEXTROSE 2-4 GM/100ML-% IV SOLN
INTRAVENOUS | Status: AC
  Filled 2020-05-21: qty 100

## 2020-05-21 MED ORDER — MIDAZOLAM HCL 2 MG/2ML IJ SOLN
INTRAMUSCULAR | Status: AC
  Filled 2020-05-21: qty 2

## 2020-05-21 MED ORDER — MIDAZOLAM HCL 5 MG/5ML IJ SOLN
INTRAMUSCULAR | Status: DC | PRN
  Administered 2020-05-21: 2 mg via INTRAVENOUS

## 2020-05-21 MED ORDER — FENTANYL CITRATE (PF) 100 MCG/2ML IJ SOLN
INTRAMUSCULAR | Status: DC | PRN
  Administered 2020-05-21: 50 ug via INTRAVENOUS

## 2020-05-21 MED ORDER — ONDANSETRON HCL 4 MG/2ML IJ SOLN
INTRAMUSCULAR | Status: DC | PRN
  Administered 2020-05-21: 4 mg via INTRAVENOUS

## 2020-05-21 MED ORDER — DEXAMETHASONE SODIUM PHOSPHATE 10 MG/ML IJ SOLN
INTRAMUSCULAR | Status: DC | PRN
  Administered 2020-05-21: 5 mg via INTRAVENOUS

## 2020-05-21 MED ORDER — PROPOFOL 10 MG/ML IV BOLUS
INTRAVENOUS | Status: AC
  Filled 2020-05-21: qty 40

## 2020-05-21 MED ORDER — LACTATED RINGERS IV SOLN: INTRAVENOUS | Status: DC | PRN

## 2020-05-21 MED ORDER — ONDANSETRON HCL 4 MG/2ML IJ SOLN
INTRAMUSCULAR | Status: AC
  Filled 2020-05-21: qty 2

## 2020-05-21 MED ORDER — 0.9 % SODIUM CHLORIDE (POUR BTL) OPTIME
TOPICAL | Status: DC | PRN
  Administered 2020-05-21: 1000 mL

## 2020-05-21 MED ORDER — LIDOCAINE 2% (20 MG/ML) 5 ML SYRINGE
INTRAMUSCULAR | Status: AC
  Filled 2020-05-21: qty 5

## 2020-05-21 MED ORDER — CEFAZOLIN SODIUM-DEXTROSE 2-4 GM/100ML-% IV SOLN
2.0000 g | INTRAVENOUS | Status: AC
  Administered 2020-05-21: 2 g via INTRAVENOUS

## 2020-05-21 MED ORDER — LIDOCAINE 2% (20 MG/ML) 5 ML SYRINGE
INTRAMUSCULAR | Status: DC | PRN
  Administered 2020-05-21: 100 mg via INTRAVENOUS

## 2020-05-21 SURGICAL SUPPLY — 54 items
APL PRP STRL LF DISP 70% ISPRP (MISCELLANEOUS) ×1
BNDG COHESIVE 6X5 TAN STRL LF (GAUZE/BANDAGES/DRESSINGS) ×3 IMPLANT
BNDG ELASTIC 4X5.8 VLCR STR LF (GAUZE/BANDAGES/DRESSINGS) ×3 IMPLANT
BNDG ELASTIC 6X5.8 VLCR STR LF (GAUZE/BANDAGES/DRESSINGS) ×3 IMPLANT
BNDG GAUZE ELAST 4 BULKY (GAUZE/BANDAGES/DRESSINGS) ×6 IMPLANT
BRUSH SCRUB EZ PLAIN DRY (MISCELLANEOUS) ×4 IMPLANT
CHLORAPREP W/TINT 26 (MISCELLANEOUS) ×3 IMPLANT
COVER SURGICAL LIGHT HANDLE (MISCELLANEOUS) ×6 IMPLANT
COVER WAND RF STERILE (DRAPES) ×3 IMPLANT
CUFF TOURN SGL QUICK 18X4 (TOURNIQUET CUFF) IMPLANT
DRAPE C-ARM 42X72 X-RAY (DRAPES) ×2 IMPLANT
DRAPE C-ARMOR (DRAPES) ×3 IMPLANT
DRAPE U-SHAPE 47X51 STRL (DRAPES) ×3 IMPLANT
DRSG ADAPTIC 3X8 NADH LF (GAUZE/BANDAGES/DRESSINGS) ×3 IMPLANT
DRSG MEPILEX BORDER 4X4 (GAUZE/BANDAGES/DRESSINGS) ×3 IMPLANT
ELECT REM PT RETURN 9FT ADLT (ELECTROSURGICAL) ×3
ELECTRODE REM PT RTRN 9FT ADLT (ELECTROSURGICAL) ×1 IMPLANT
GAUZE SPONGE 4X4 12PLY STRL (GAUZE/BANDAGES/DRESSINGS) ×3 IMPLANT
GLOVE BIO SURGEON STRL SZ 6.5 (GLOVE) ×6 IMPLANT
GLOVE BIO SURGEON STRL SZ7.5 (GLOVE) ×12 IMPLANT
GLOVE BIO SURGEONS STRL SZ 6.5 (GLOVE) ×3
GLOVE BIOGEL PI IND STRL 6.5 (GLOVE) ×1 IMPLANT
GLOVE BIOGEL PI IND STRL 7.5 (GLOVE) ×1 IMPLANT
GLOVE BIOGEL PI INDICATOR 6.5 (GLOVE) ×2
GLOVE BIOGEL PI INDICATOR 7.5 (GLOVE) ×2
GOWN STRL REUS W/ TWL LRG LVL3 (GOWN DISPOSABLE) ×2 IMPLANT
GOWN STRL REUS W/TWL LRG LVL3 (GOWN DISPOSABLE) ×6
KIT BASIN OR (CUSTOM PROCEDURE TRAY) ×3 IMPLANT
KIT TURNOVER KIT B (KITS) ×3 IMPLANT
MANIFOLD NEPTUNE II (INSTRUMENTS) ×3 IMPLANT
NS IRRIG 1000ML POUR BTL (IV SOLUTION) ×3 IMPLANT
PACK ORTHO EXTREMITY (CUSTOM PROCEDURE TRAY) ×3 IMPLANT
PAD ARMBOARD 7.5X6 YLW CONV (MISCELLANEOUS) ×6 IMPLANT
PADDING CAST COTTON 6X4 STRL (CAST SUPPLIES) ×9 IMPLANT
SPONGE LAP 18X18 RF (DISPOSABLE) ×3 IMPLANT
STAPLER VISISTAT 35W (STAPLE) ×2 IMPLANT
STOCKINETTE IMPERVIOUS LG (DRAPES) ×3 IMPLANT
SUCTION FRAZIER HANDLE 10FR (MISCELLANEOUS) ×3
SUCTION TUBE FRAZIER 10FR DISP (MISCELLANEOUS) IMPLANT
SUT ETHILON 3 0 PS 1 (SUTURE) ×3 IMPLANT
SUT MNCRL AB 3-0 PS2 18 (SUTURE) ×3 IMPLANT
SUT MON AB 2-0 CT1 36 (SUTURE) ×3 IMPLANT
SUT VIC AB 0 CT1 27 (SUTURE) ×3
SUT VIC AB 0 CT1 27XBRD ANBCTR (SUTURE) IMPLANT
SUT VIC AB 2-0 CT1 27 (SUTURE) ×3
SUT VIC AB 2-0 CT1 TAPERPNT 27 (SUTURE) IMPLANT
SYR TOOMEY 50ML (SYRINGE) ×2 IMPLANT
TOWEL GREEN STERILE (TOWEL DISPOSABLE) ×6 IMPLANT
TOWEL GREEN STERILE FF (TOWEL DISPOSABLE) ×6 IMPLANT
TUBE CONNECTING 12'X1/4 (SUCTIONS) ×1
TUBE CONNECTING 12X1/4 (SUCTIONS) ×2 IMPLANT
UNDERPAD 30X36 HEAVY ABSORB (UNDERPADS AND DIAPERS) ×3 IMPLANT
WATER STERILE IRR 1000ML POUR (IV SOLUTION) ×6 IMPLANT
YANKAUER SUCT BULB TIP NO VENT (SUCTIONS) ×3 IMPLANT

## 2020-05-21 NOTE — Transfer of Care (Signed)
Immediate Anesthesia Transfer of Care Note  Patient: Cindy Mcclure  Procedure(s) Performed: HARDWARE REMOVAL PROXIMAL TIBIA (Left )  Patient Location: PACU  Anesthesia Type:General  Level of Consciousness: awake, alert  and oriented  Airway & Oxygen Therapy: Patient Spontanous Breathing and Patient connected to nasal cannula oxygen  Post-op Assessment: Report given to RN, Post -op Vital signs reviewed and stable and Patient moving all extremities  Post vital signs: Reviewed and stable  Last Vitals:  Vitals Value Taken Time  BP 102/68 05/21/20 0802  Temp    Pulse 69 05/21/20 0804  Resp 16 05/21/20 0804  SpO2 99 % 05/21/20 0804  Vitals shown include unvalidated device data.  Last Pain:  Vitals:   05/21/20 0609  TempSrc:   PainSc: 0-No pain      Patients Stated Pain Goal: 3 (50/41/36 4383)  Complications: No complications documented.

## 2020-05-21 NOTE — Anesthesia Postprocedure Evaluation (Signed)
Anesthesia Post Note  Patient: Cindy Mcclure  Procedure(s) Performed: HARDWARE REMOVAL PROXIMAL TIBIA (Left )     Patient location during evaluation: PACU Anesthesia Type: General Level of consciousness: sedated Pain management: pain level controlled Vital Signs Assessment: post-procedure vital signs reviewed and stable Respiratory status: spontaneous breathing and respiratory function stable Cardiovascular status: stable Postop Assessment: no apparent nausea or vomiting Anesthetic complications: no   No complications documented.  Last Vitals:  Vitals:   05/21/20 0817 05/21/20 0832  BP: 109/77 106/72  Pulse: 63 79  Resp: 12 17  Temp:  36.5 C  SpO2: 100% 96%    Last Pain:  Vitals:   05/21/20 0832  TempSrc:   PainSc: 0-No pain                 Merlinda Frederick

## 2020-05-21 NOTE — Op Note (Signed)
Orthopaedic Surgery Operative Note (CSN: [id number] ) Date of Surgery: 05/21/2020  Admit Date: 05/21/2020   Diagnoses: Pre-Op Diagnoses: Loose screw from proximal tibial plate Left proximal tibia nonunion  Post-Op Diagnosis: Same  Procedures: CPT 20680-Removal of hardware left proximal tibia  Surgeons : Primary: Shona Needles, MD  Assistant: Patrecia Pace, PA-C  Location: OR 3  Anesthesia:General  Antibiotics: Ancef 2g preop   Tourniquet time: None    Estimated Blood Loss:1 mL  Complications:None  Specimens:None   Implants: * No implants in log *   Indications for Surgery: 54 year old female who sustained a left tibial plateau fracture that underwent open reduction internal fixation on Dec 26, 2019.  She subsequently went on for delayed/nonunion of her proximal tibia.  She presented with some loosening of one of the proximal locking screws.  It went on to protrude even further to where it was significantly bothering her with daily activities.  I discussed with her the possibility of removal in the clinic versus removal in the OR.  I felt that the most appropriate thing would to proceed to the operating room for removal of the screw under anesthesia.  I discussed risks and benefits with the patient.  Risks included but not limited to bleeding, infection, continued persistent nonunion, need for revision surgery, further failure of other screws and hardware.  The patient agreed to proceed with surgery and consent was obtained.  Operative Findings: Successful removal of prominent screw from left proximal tibia  Procedure: The patient was identified in the preoperative holding area. Consent was confirmed with the patient and their family and all questions were answered. The operative extremity was marked after confirmation with the patient. she was then brought back to the operating room by our anesthesia colleagues.  She was carefully transferred over to a radiolucent flat top  table.  She was placed under general anesthetic.  The left lower extremity was prepped and draped in usual sterile fashion.  Timeout was performed to verify the patient, the procedure, and the extremity.  Preoperative antibiotics were dosed.  I made a small percutaneous incision over the prominent screw head.  There is no purulence but there was a small bursa that had developed.  I was able to successfully remove the screw with a screwdriver.  Fluoroscopic imaging was obtained to show that there were no further hardware issues.  I then stressed the proximal tibia and there is no motion at the nonunion site or at the joint.  The incision was then irrigated.  It was closed with 3-0 nylon.  A sterile dressing was placed.  The patient was then awoken from anesthesia and taken to the PACU in stable condition.  Post Op Plan/Instructions: Patient will be weightbearing as tolerated bilateral lower extremities.  She'll continue with the bone stimulator to her proximal tibia.  Plan to have her return in 2 to 3 weeks for suture removal.  No DVT prophylaxis is needed in this ambulatory patient.  I was present and performed the entire surgery.  Katha Hamming, MD [facility]

## 2020-05-21 NOTE — Interval H&P Note (Signed)
History and Physical Interval Note:  05/21/2020 7:00 AM  Cindy Mcclure  has presented today for surgery, with the diagnosis of SYMPTOMATIC HARDWARE LEFT KNEE.  The various methods of treatment have been discussed with the patient and family. After consideration of risks, benefits and other options for treatment, the patient has consented to  Procedure(s): [place] (Left) as a surgical intervention.  The patient's history has been reviewed, patient examined, no change in status, stable for surgery.  I have reviewed the patient's chart and labs.  Questions were answered to the patient's satisfaction.     Lennette Bihari P Lyncoln Maskell

## 2020-05-21 NOTE — Anesthesia Procedure Notes (Signed)
Procedure Name: LMA Insertion Date/Time: 05/21/2020 7:34 AM Performed by: Mariea Clonts, CRNA Pre-anesthesia Checklist: Patient identified, Emergency Drugs available, Suction available and Patient being monitored Patient Re-evaluated:Patient Re-evaluated prior to induction Oxygen Delivery Method: Circle System Utilized Preoxygenation: Pre-oxygenation with 100% oxygen Induction Type: IV induction Ventilation: Mask ventilation without difficulty LMA: LMA inserted LMA Size: 3.0 Number of attempts: 1 Airway Equipment and Method: Bite block Placement Confirmation: positive ETCO2 Tube secured with: Tape Dental Injury: Teeth and Oropharynx as per pre-operative assessment  Comments: Placed by Heide Scales, SRNA

## 2020-05-21 NOTE — Discharge Instructions (Signed)
[facility] Discharge Instructions   General Discharge Instructions  WEIGHT BEARING STATUS: Weightbearing as tolerated bilateral lower extremties  RANGE OF MOTION/ACTIVITY: Okay for unrestricted knee motion  Wound Care: You may remove your surgical dressing on post-op day #2 (Wednesday 05/23/20).  Incisions can be left open to air if there is no drainage. If incision continues to have drainage, follow wound care instructions below. Okay to shower if no drainage from incisions.  DVT/PE prophylaxis: None  Diet: as you were eating previously.  Can use over the counter stool softeners and bowel preparations, such as Miralax, to help with bowel movements.  Narcotics can be constipating.  Be sure to drink plenty of fluids  PAIN MEDICATION USE AND EXPECTATIONS  You have likely been given narcotic medications to help control your pain.  After a traumatic event that results in an fracture (broken bone) with or without surgery, it is ok to use narcotic pain medications to help control one's pain.  We understand that everyone responds to pain differently and each individual patient will be evaluated on a regular basis for the continued need for narcotic medications. Ideally, narcotic medication use should last no more than 6-8 weeks (coinciding with fracture healing).   As a patient it is your responsibility as well to monitor narcotic medication use and report the amount and frequency you use these medications when you come to your office visit.   We would also advise that if you are using narcotic medications, you should take a dose prior to therapy to maximize you participation.  IF YOU ARE ON NARCOTIC MEDICATIONS IT IS NOT PERMISSIBLE TO OPERATE A MOTOR VEHICLE (MOTORCYCLE/CAR/TRUCK/MOPED) OR HEAVY MACHINERY DO NOT MIX NARCOTICS WITH OTHER CNS (CENTRAL NERVOUS SYSTEM) DEPRESSANTS SUCH AS ALCOHOL   STOP SMOKING OR USING NICOTINE PRODUCTS!!!!  As discussed nicotine severely impairs  your body's ability to heal surgical and traumatic wounds but also impairs bone healing.  Wounds and bone heal by forming microscopic blood vessels (angiogenesis) and nicotine is a vasoconstrictor (essentially, shrinks blood vessels).  Therefore, if vasoconstriction occurs to these microscopic blood vessels they essentially disappear and are unable to deliver necessary nutrients to the healing tissue.  This is one modifiable factor that you can do to dramatically increase your chances of healing your injury.    (This means no smoking, no nicotine gum, patches, etc)  DO NOT USE NONSTEROIDAL ANTI-INFLAMMATORY DRUGS (NSAID'S)  Using products such as Advil (ibuprofen), Aleve (naproxen), Motrin (ibuprofen) for additional pain control during fracture healing can delay and/or prevent the healing response.  If you would like to take over the counter (OTC) medication, Tylenol (acetaminophen) is ok.  However, some narcotic medications that are given for pain control contain acetaminophen as well. Therefore, you should not exceed more than 4000 mg of tylenol in a day if you do not have liver disease.  Also note that there are may OTC medicines, such as cold medicines and allergy medicines that my contain tylenol as well.  If you have any questions about medications and/or interactions please ask your doctor/PA or your pharmacist.      ICE AND ELEVATE INJURED/OPERATIVE EXTREMITY  Using ice and elevating the injured extremity above your heart can help with swelling and pain control.  Icing in a pulsatile fashion, such as 20 minutes on and 20 minutes off, can be followed.    Do not place ice directly on skin. Make sure there is a barrier between to skin and the ice pack.  Using frozen items such as frozen peas works well as the conform nicely to the are that needs to be iced.  USE AN ACE WRAP OR TED HOSE FOR SWELLING CONTROL  In addition to icing and elevation, Ace wraps or TED hose are used to help limit and  resolve swelling.  It is recommended to use Ace wraps or TED hose until you are informed to stop.    When using Ace Wraps start the wrapping distally (farthest away from the body) and wrap proximally (closer to the body)   Example: If you had surgery on your leg or thing and you do not have a splint on, start the ace wrap at the toes and work your way up to the thigh        If you had surgery on your upper extremity and do not have a splint on, start the ace wrap at your fingers and work your way up to the upper arm   [place]: [phone]   VISIT OUR WEBSITE FOR ADDITIONAL INFORMATION: [email or web address]     Discharge Wound Care Instructions  Do NOT apply any ointments, solutions or lotions to pin sites or surgical wounds.  These prevent needed drainage and even though solutions like hydrogen peroxide kill bacteria, they also damage cells lining the pin sites that help fight infection.  Applying lotions or ointments can keep the wounds moist and can cause them to breakdown and open up as well. This can increase the risk for infection. When in doubt call the office.  Surgical incisions should be dressed daily.  If any drainage is noted, use one layer of adaptic, then gauze, Kerlix, and an ace wrap.  Once the incision is completely dry and without drainage, it may be left open to air out.  Showering may begin 36-48 hours later.  Cleaning gently with soap and water.  Traumatic wounds should be dressed daily as well.    One layer of adaptic, gauze, Kerlix, then ace wrap.  The adaptic can be discontinued once the draining has ceased    If you have a wet to dry dressing: wet the gauze with saline the squeeze as much saline out so the gauze is moist (not soaking wet), place moistened gauze over wound, then place a dry gauze over the moist one, followed by Kerlix wrap, then ace wrap.

## 2020-05-22 ENCOUNTER — Encounter (HOSPITAL_COMMUNITY): Payer: Self-pay | Admitting: Student

## 2020-06-07 ENCOUNTER — Other Ambulatory Visit: Payer: Self-pay | Admitting: Family Medicine

## 2020-06-07 DIAGNOSIS — Z1231 Encounter for screening mammogram for malignant neoplasm of breast: Secondary | ICD-10-CM

## 2020-06-18 ENCOUNTER — Telehealth: Payer: Self-pay

## 2020-06-18 ENCOUNTER — Other Ambulatory Visit: Payer: Self-pay

## 2020-06-18 ENCOUNTER — Telehealth: Payer: 59 | Admitting: Psychiatry

## 2020-06-18 DIAGNOSIS — Z5329 Procedure and treatment not carried out because of patient's decision for other reasons: Secondary | ICD-10-CM

## 2020-06-18 DIAGNOSIS — Z91199 Patient's noncompliance with other medical treatment and regimen due to unspecified reason: Secondary | ICD-10-CM

## 2020-06-18 NOTE — Telephone Encounter (Signed)
Appointment - Patient left a message she spoke briefly to Dr. Shea Evans today but had to reschedule her appt due to problems with being out and could not do the appt at that time.  Pt. requests a call back to set up a new appt time.

## 2020-06-18 NOTE — Progress Notes (Signed)
At the time of the evaluation patient connected by video and said she was at the grocery store when asked about location.  Patient unable to go to a private location for this appointment.  Hence advised patient that this appointment cannot be completed.  She is agreeable.  She will call back to reschedule at her convenience.

## 2020-06-19 NOTE — Telephone Encounter (Signed)
Pt was called and appt made.

## 2020-06-21 ENCOUNTER — Ambulatory Visit (INDEPENDENT_AMBULATORY_CARE_PROVIDER_SITE_OTHER): Payer: 59 | Admitting: Licensed Clinical Social Worker

## 2020-06-21 ENCOUNTER — Other Ambulatory Visit: Payer: Self-pay

## 2020-06-21 DIAGNOSIS — F3342 Major depressive disorder, recurrent, in full remission: Secondary | ICD-10-CM

## 2020-06-21 DIAGNOSIS — F411 Generalized anxiety disorder: Secondary | ICD-10-CM | POA: Diagnosis not present

## 2020-06-21 NOTE — Progress Notes (Signed)
Virtual Visit via Video Note  I connected with Cindy Mcclure on 06/21/20 at 12:30 PM EDT by a video enabled telemedicine application and verified that I am speaking with the correct person using two identifiers.  Location: Patient: home Provider: [facility]   I discussed the limitations of evaluation and management by telemedicine and the availability of in person appointments. The patient expressed understanding and agreed to proceed.    The patient was advised to call back or seek an in-person evaluation if the symptoms worsen or if the condition fails to improve as anticipated.  I provided 20 minutes of non-face-to-face time during this encounter.   Jaquel Glassburn R Reichen Hutzler, [name]    THERAPIST PROGRESS NOTE  Session Time: 12:30-12:50p  Participation Level: Active  Behavioral Response: CasualAlertgenerally pleasant; positive  Type of Therapy: Individual Therapy  Treatment Goals addressed: Coping  Interventions: Supportive  Summary: Cindy Mcclure is a 54 y.o. female who presents with improving symptoms related to depression and anxiety diagnoses.  Pt reports that mood is stable and that she feels she manages stress/anxiety well. Pt reports that she is compliant with medication and that she is getting good quality and quantity of sleep. Pt reports healthy eating patterns and tries to follow daily structure.   Discussed stress associated with daughter and daughter's overall college experience--pt has struggled with anxiety when daughter left the home to attend college because it left the majority of household chores for pt to do. Pt reports that she is happy that daughter decided that she wants to move back home for a while and commute back and forth to school--pts daughter was uncomfortable living in the dorm due to social anxiety.   Discussed relapse prevention strategies and encouraged self care--follow up in 2 mo.  Suicidal/Homicidal: No  SI, HI, or AVH reported at time of  session.  Therapist Response: Cindy Mcclure reports that she is maintaining current levels of progress. Pt reports no depression or anxiety symptoms at time of session. Pt feels physical healing is helping with overall emotional stability.   Plan: Return again in 8 weeks.The ongoing treatment plan includes maintaining current levels of progress and continuing to build skills to manage mood, improve stress/anxiety management, emotion regulation, distress tolerance, and behavior modification.   Diagnosis: Axis I: Major depressive disorder, recurrent, in full remission; Generalized anxiety disorder    Axis II: No diagnosis    Cindy Bo Jamila Slatten, [name] 06/21/2020

## 2020-07-04 ENCOUNTER — Other Ambulatory Visit: Payer: Self-pay | Admitting: Psychiatry

## 2020-07-04 DIAGNOSIS — F411 Generalized anxiety disorder: Secondary | ICD-10-CM

## 2020-07-04 DIAGNOSIS — F3342 Major depressive disorder, recurrent, in full remission: Secondary | ICD-10-CM

## 2020-07-17 ENCOUNTER — Other Ambulatory Visit: Payer: Self-pay

## 2020-07-17 ENCOUNTER — Ambulatory Visit
Admission: RE | Admit: 2020-07-17 | Discharge: 2020-07-17 | Disposition: A | Discharge status: Home / Self Care | Payer: 59 | Source: Ambulatory Visit | Attending: Family Medicine | Admitting: Family Medicine

## 2020-07-17 DIAGNOSIS — Z1231 Encounter for screening mammogram for malignant neoplasm of breast: Secondary | ICD-10-CM | POA: Diagnosis not present

## 2020-07-20 ENCOUNTER — Other Ambulatory Visit: Payer: Self-pay | Admitting: Student

## 2020-07-20 DIAGNOSIS — M25562 Pain in left knee: Secondary | ICD-10-CM

## 2020-07-23 ENCOUNTER — Encounter: Payer: Self-pay | Admitting: Psychiatry

## 2020-07-23 ENCOUNTER — Telehealth (INDEPENDENT_AMBULATORY_CARE_PROVIDER_SITE_OTHER): Payer: 59 | Admitting: Psychiatry

## 2020-07-23 ENCOUNTER — Other Ambulatory Visit: Payer: Self-pay

## 2020-07-23 DIAGNOSIS — F3342 Major depressive disorder, recurrent, in full remission: Secondary | ICD-10-CM

## 2020-07-23 DIAGNOSIS — F411 Generalized anxiety disorder: Secondary | ICD-10-CM

## 2020-07-23 NOTE — Progress Notes (Signed)
Virtual Visit via Telephone Note  I connected with Cindy Mcclure on 07/23/20 at  3:00 PM EST by telephone and verified that I am speaking with the correct person using two identifiers.  Location Provider Location : [facility] Patient Location : Home  Participants: Patient , Provider    I discussed the limitations, risks, security and privacy concerns of performing an evaluation and management service by telephone and the availability of in person appointments. I also discussed with the patient that there may be a patient responsible charge related to this service. The patient expressed understanding and agreed to proceed.   I discussed the assessment and treatment plan with the patient. The patient was provided an opportunity to ask questions and all were answered. The patient agreed with the plan and demonstrated an understanding of the instructions.   The patient was advised to call back or seek an in-person evaluation if the symptoms worsen or if the condition fails to improve as anticipated.  [place] MD OP Progress Note  07/23/2020 3:00 PM Cindy Mcclure  MRN:  [id number]  Chief Complaint:  Chief Complaint    Follow-up     HPI: Cindy Mcclure is a 54 year old Caucasian female on disability, lives in [place], has a history of MDD, GAD, seizure disorder, urinary incontinence, degenerative disc disease was evaluated by telemedicine today.  Patient today reports she is currently making progress.  Denies any significant sadness or crying spells.  Denies any significant anxiety symptoms.  She reports she is compliant on the Zoloft.  Denies side effects.  She reports sleep and appetite as fair.  She reports she continues to have trouble with her leg which she fractured recently.  She reports she continues to follow-up with her providers for the same.  She reports today is her birthday and she was able to spend some time with her mother and they went out for brunch.  She reports her  son is coming in to spend some time together.  Patient denies any suicidality, homicidality or perceptual disturbances.  Patient denies any other concerns today.  Visit Diagnosis:    ICD-10-CM   1. MDD (major depressive disorder), recurrent, in full remission (Cindy Mcclure)  F33.42   2. GAD (generalized anxiety disorder)  F41.1     Past Psychiatric History: I have reviewed past psychiatric history from my progress note on 11/04/2018  Past Medical History:  Past Medical History:  Diagnosis Date  . Allergic rhinitis   . Anginal pain [facility])    sees dr Clayborn Bigness  . Anxiety   . Arthritis    all over...knees, ankles, back  . Back pain   . Cervical radiculitis    no surgery, just cortisone shots.    . Constipation   . Constipation   . DDD (degenerative disc disease), cervical   . Dermatophytosis of nail 2019   both feet  . GERD (gastroesophageal reflux disease)   . History of kidney stones    h/o  . Osteoarthritis   . Panic attacks   . Plantar fasciitis    bilaterally  . Seizures ([place]) 1972   unsure of when she had last seizure due to petit mal type. does not know what brings on a seizure.  . Stress bladder incontinence, female   . Urinary incontinence     Past Surgical History:  Procedure Laterality Date  . APPENDECTOMY    . CESAREAN SECTION  [id number]  . CHOLECYSTECTOMY N/A 12/06/2015   Procedure: LAPAROSCOPIC CHOLECYSTECTOMY WITH INTRAOPERATIVE CHOLANGIOGRAM;  Surgeon: Dia Crawford III, MD;  Location: [facility] ORS;  Service: General;  Laterality: N/A;  . COLONOSCOPY WITH PROPOFOL N/A 09/29/2016   Procedure: COLONOSCOPY WITH PROPOFOL;  Surgeon: Manya Silvas, MD;  Location: [facility];  Service: Endoscopy;  Laterality: N/A;  . EXTERNAL FIXATION LEG Left 12/22/2019   Procedure: External fixation left spanning knee. CPT 20690 uniplane;  Surgeon: Leandrew Koyanagi, MD;  Location: [place];  Service: Orthopedics;  Laterality: Left;  . EXTERNAL FIXATION REMOVAL Left 12/26/2019   Procedure:  Removal External Fixation Leg;  Surgeon: Shona Needles, MD;  Location: [place];  Service: Orthopedics;  Laterality: Left;  . HARDWARE REMOVAL Left 05/21/2020   Procedure: HARDWARE REMOVAL PROXIMAL TIBIA;  Surgeon: Shona Needles, MD;  Location: [place];  Service: Orthopedics;  Laterality: Left;  . KNEE ARTHROSCOPY WITH EXCISION PLICA Left 4/48/1856   Procedure: KNEE ARTHROSCOPY WITH EXCISION PLICA, PARTIAL SYNOVECTOMY;  Surgeon: Hessie Knows, MD;  Location: [facility] OR[facility];  Service: Orthopedics;  Laterality: Left;  . KNEE ARTHROSCOPY WITH MEDIAL MENISECTOMY Right 09/03/2017   Procedure: KNEE ARTHROSCOPY WITH MEDIAL MENISECTOMY, PARTIAL SYNOVECTOMY;  Surgeon: Hessie Knows, MD;  Location: [facility];  Service: Orthopedics;  Laterality: Right;  . ORIF CALCANEOUS FRACTURE Right 12/26/2019   Procedure: OPEN REDUCTION INTERNAL FIXATION (ORIF) CALCANEOUS FRACTURE;  Surgeon: Shona Needles, MD;  Location: [place];  Service: Orthopedics;  Laterality: Right;  . ORIF TIBIA PLATEAU Left 12/26/2019   Procedure: OPEN REDUCTION INTERNAL FIXATION (ORIF) TIBIAL PLATEAU;  Surgeon: Shona Needles, MD;  Location: [place];  Service: Orthopedics;  Laterality: Left;  . SHOULDER ARTHROSCOPY WITH DEBRIDEMENT AND BICEP TENDON REPAIR Left 03/22/2019   Procedure: SHOULDER ARTHROSCOPY WITH DEBRIDEMENT, ROTATOR CUFF REPAIR, AND BICEP TENDON REPAIR;  Surgeon: Corky Mull, MD;  Location: [facility];  Service: Orthopedics;  Laterality: Left;  . SUBACROMIAL DECOMPRESSION  03/22/2019   Procedure: SUBACROMIAL DECOMPRESSION;  Surgeon: Corky Mull, MD;  Location: [facility];  Service: Orthopedics;;  . SURGERY FOR SEIZURES     nothing implanted in head. she was 54 years old. [facility]  . TONSILLECTOMY    . TUBAL LIGATION  2003    Family Psychiatric History: I have reviewed family psychiatric history from my progress note on 11/04/2018  Family History:  Family History  Problem Relation Age of Onset  . Anxiety disorder Daughter     Social  History: Reviewed social history from my progress note on 11/04/2018 Social History   Socioeconomic History  . Marital status: Divorced    Spouse name: Not on file  . Number of children: Not on file  . Years of education: Not on file  . Highest education level: Not on file  Occupational History  . Not on file  Tobacco Use  . Smoking status: Former Smoker    Packs/day: 0.25    Types: Cigarettes    Quit date: 08/17/2012    Years since quitting: 7.9  . Smokeless tobacco: Never Used  . Tobacco comment: pt states she smoked intermittently-smokers in home  Vaping Use  . Vaping Use: Never used  Substance and Sexual Activity  . Alcohol use: Yes    Comment: only on special occasions   . Drug use: No    Comment: denies  . Sexual activity: Not Currently  Other Topics Concern  . Not on file  Social History Narrative  . Not on file   Social Determinants of Health   Financial Resource Strain:   . Difficulty of Paying  Living Expenses: Not on file  Food Insecurity:   . Worried About Charity fundraiser in the Last Year: Not on file  . Ran Out of Food in the Last Year: Not on file  Transportation Needs:   . Lack of Transportation (Medical): Not on file  . Lack of Transportation (Non-Medical): Not on file  Physical Activity:   . Days of Exercise per Week: Not on file  . Minutes of Exercise per Session: Not on file  Stress:   . Feeling of Stress : Not on file  Social Connections:   . Frequency of Communication with Friends and Family: Not on file  . Frequency of Social Gatherings with Friends and Family: Not on file  . Attends Religious Services: Not on file  . Active Member of Clubs or Organizations: Not on file  . Attends Archivist Meetings: Not on file  . Marital Status: Not on file    Allergies: No Known Allergies  Metabolic Disorder Labs: No results found for: HGBA1C, MPG No results found for: PROLACTIN No results found for: CHOL, TRIG, HDL, CHOLHDL, VLDL,  LDLCALC Lab Results  Component Value Date   TSH 1.930 11/04/2018    Therapeutic Level Labs: No results found for: LITHIUM No results found for: VALPROATE No components found for:  CBMZ  Current Medications: Current Outpatient Medications  Medication Sig Dispense Refill  . Calcium Carb-Cholecalciferol (CALCIUM 600 + D PO) Take 600 mg by mouth daily.     . cloBAZam (ONFI) 20 MG tablet Take 25 mg by mouth daily with supper.     . diclofenac (VOLTAREN) 75 MG EC tablet Take 75 mg by mouth 2 (two) times daily.    . diclofenac Sodium (VOLTAREN) 1 % GEL Apply 2 g topically 4 (four) times daily as needed (Pain).     Marland Kitchen docusate sodium (COLACE) 100 MG capsule Take 1 capsule (100 mg total) by mouth 2 (two) times daily. (Patient taking differently: Take 100 mg by mouth daily as needed for mild constipation. ) 14 capsule 0  . enoxaparin (LOVENOX) 40 MG/0.4ML injection Inject 0.4 mLs (40 mg total) into the skin daily. 12 mL 0  . lamoTRIgine (LAMICTAL) 150 MG tablet Take 150 mg by mouth 2 (two) times daily.     . Levetiracetam (KEPPRA XR) 750 MG TB24 Take 750 mg by mouth 2 (two) times daily.    . Multiple Vitamins-Minerals (CENTRUM ADULTS PO) Take 1 tablet by mouth daily.     Marland Kitchen omeprazole (PRILOSEC) 20 MG capsule Take 20 mg by mouth daily with supper.     . pyridoxine (B-6) 100 MG tablet Take by mouth.    . sertraline (ZOLOFT) 100 MG tablet TAKE 1 TABLET BY MOUTH DAILY 30 tablet 2  . traMADol (ULTRAM) 50 MG tablet Take 50 mg by mouth daily.     . Vitamin A 2400 MCG (8000 UT) CAPS Take 8,000 Units by mouth daily.     . vitamin B-12 (CYANOCOBALAMIN) 500 MCG tablet Take 500 mcg by mouth daily.     . vitamin C (ASCORBIC ACID) 500 MG tablet Take 500 mg by mouth daily.    . Vitamin E 180 MG (400 UNIT) CAPS Take 180 mcg by mouth daily.     Marland Kitchen zonisamide (ZONEGRAN) 100 MG capsule Take 400 mg by mouth daily with supper.     No current facility-administered medications for this visit.      Musculoskeletal: Strength & Muscle Tone: UTA Gait & Station: UTA Patient  leans: N/A  Psychiatric Specialty Exam: Review of Systems  Psychiatric/Behavioral: Negative for agitation, behavioral problems, confusion, decreased concentration, dysphoric mood, hallucinations, self-injury, sleep disturbance and suicidal ideas. The patient is not nervous/anxious and is not hyperactive.   All other systems reviewed and are negative.   There were no vitals taken for this visit.There is no height or weight on file to calculate BMI.  General Appearance: UTA  Eye Contact:  UTA  Speech:  Clear and Coherent  Volume:  Normal  Mood:  Euthymic  Affect:  UTA  Thought Process:  Goal Directed and Descriptions of Associations: Intact  Orientation:  Full (Time, Place, and Person)  Thought Content: Logical   Suicidal Thoughts:  No  Homicidal Thoughts:  No  Memory:  Immediate;   Fair Recent;   Fair Remote;   Fair  Judgement:  Fair  Insight:  Fair  Psychomotor Activity:  UTA  Concentration:  Concentration: Fair and Attention Span: Fair  Recall:  [employer] of Knowledge: Fair  Language: Fair  Akathisia:  No  Handed:  Right  AIMS (if indicated): UTA  Assets:  Communication Skills Desire for Improvement Housing Social Support  ADL's:  Intact  Cognition: WNL  Sleep:  Fair   Screenings: PHQ2-9     Office Visit from 05/23/2019 in [place]  PHQ-2 Total Score 2  PHQ-9 Total Score 6       Assessment and Plan: Cindy Mcclure is a 54 year old Caucasian female, widowed, lives in [place], has a history of seizure disorder, MDD, GAD, degenerative disc disease, chronic pain, recent fracture of lower extremity  was evaluated by telemedicine today.  Patient is biologically predisposed given multiple health issues, chronic pain, family history of mental health problems.  Patient with psychosocial stressors of relationship struggles, recent health problems however is  currently making progress.  Plan as noted below.  Plan MDD in remission Zoloft 100 mg p.o. daily Continue CBT as needed  GAD-stable Zoloft as prescribed  Follow-up in clinic in 6 to 8 weeks or sooner if needed.  I have spent atleast 20 minutes non face to face by video with patient today. More than 50 % of the time was spent for preparing to see the patient ( e.g., review of test, records ), ordering medications and test ,psychoeducation and supportive psychotherapy and care coordination,as well as documenting clinical information in electronic health record.   This note was generated in part or whole with voice recognition software. Voice recognition is usually quite accurate but there are transcription errors that can and very often do occur. I apologize for any typographical errors that were not detected and corrected.      Ursula Alert, MD 07/24/2020, 8:37 AM

## 2020-07-25 ENCOUNTER — Other Ambulatory Visit: Payer: Self-pay

## 2020-07-25 ENCOUNTER — Ambulatory Visit
Admission: RE | Admit: 2020-07-25 | Discharge: 2020-07-25 | Disposition: A | Discharge status: Home / Self Care | Payer: 59 | Source: Ambulatory Visit | Attending: Student | Admitting: Student

## 2020-07-25 DIAGNOSIS — M25562 Pain in left knee: Secondary | ICD-10-CM

## 2020-07-25 MED ORDER — IOPAMIDOL (ISOVUE-M 200) INJECTION 41%
1.0000 mL | Freq: Once | INTRAMUSCULAR | Status: AC
  Administered 2020-07-25: 1 mL via INTRA_ARTICULAR

## 2020-07-25 MED ORDER — METHYLPREDNISOLONE ACETATE 40 MG/ML INJ SUSP (RADIOLOG
80.0000 mg | Freq: Once | INTRAMUSCULAR | Status: AC
  Administered 2020-07-25: 80 mg via INTRA_ARTICULAR

## 2020-08-21 ENCOUNTER — Other Ambulatory Visit: Payer: Self-pay

## 2020-08-21 ENCOUNTER — Ambulatory Visit (INDEPENDENT_AMBULATORY_CARE_PROVIDER_SITE_OTHER): Payer: Medicare Other | Admitting: Licensed Clinical Social Worker

## 2020-08-21 ENCOUNTER — Telehealth: Payer: Self-pay | Admitting: Licensed Clinical Social Worker

## 2020-08-21 DIAGNOSIS — F3342 Major depressive disorder, recurrent, in full remission: Secondary | ICD-10-CM

## 2020-08-21 DIAGNOSIS — F411 Generalized anxiety disorder: Secondary | ICD-10-CM | POA: Diagnosis not present

## 2020-08-21 NOTE — Telephone Encounter (Signed)
Had counseling appt w/ pt today. Has progressed well--all mood related and anxiety related symptoms in remission.  Pt self-discontinued sertraline recently due to interaction with dosage change from another medication (seizure med) triggering sedative effect.   Pt wanted to make sure you were aware of this.  Weyman Pedro, MSW, [name] Outpatient Therapist/Triage Specialist

## 2020-08-21 NOTE — Telephone Encounter (Signed)
Thank you for the information.

## 2020-08-21 NOTE — Progress Notes (Signed)
Virtual Visit via Audio Note  I connected with Cindy Mcclure on 08/21/20 at  1:00 PM EST by an audio enabled telemedicine application and verified that I am speaking with the correct person using two identifiers.  Location: Patient: home Provider: [facility]   I discussed the limitations of evaluation and management by telemedicine and the availability of in person appointments. The patient expressed understanding and agreed to proceed.   The patient was advised to call back or seek an in-person evaluation if the symptoms worsen or if the condition fails to improve as anticipated.  I provided 30 minutes of non-face-to-face time during this encounter.   Timisha Mondry R Dorann Davidson, [name]    THERAPIST PROGRESS NOTE  Session Time: 1-1:30p  Participation Level: Active  Behavioral Response: NAAlertpleasant; improved  Type of Therapy: Individual Therapy  Treatment Goals addressed: Coping  Interventions: Supportive  Summary: Cindy Mcclure is a 55 y.o. female who presents with improving symptoms associated with depression and anxiety. Pt reports that current mood is stable and that she feels she is managing stress and anxiety symptoms well. Pt reports good quality and quantity of sleep and that appetite is WNL.   Allowed pt to explore and express thoughts and feelings about recent life events. Pt enjoyed spending time with family throughout the holiday season. Pt reports that things have been fairly stable throughout the past two months--nothing bad has happened, and nothing good has happened.  "Its about the same". Pt does report that she feels overall mood is better than it has been.   Pt reports that recently (since psychiatric appt), pts seizure medication was upped, and that it made her feel very sleepy, so she decided to discontinue her sertraline. Pt reports that she did not discuss it with Dr. Elna Breslow beforehand, and requested that [name] counselor share that information with Dr. Elna Breslow.    Pt does report that she is in pain with her leg--has appt to see physician about this in April. Encouraged pt to not wait until then if pain continues to escalate.   Reviewed coping skills with patient and encouraged pt to continue focusing on self-care, positive social interaction, staying as active as possible, and keeping life in balance to assist in maintaining current levels of progress.   Suicidal/Homicidal: No  Therapist Response: Cindy Mcclure is progressing well in the ability to manage mood and anxiety as evidenced by the overall reduction in frequency and intensity of anxiety that pt is experiencing.   Plan: Return again in 8 weeks.  Diagnosis: Axis I: Generalized Anxiety Disorder and Major Depression, Recurrent, in remission    Axis II: No diagnosis    Ernest Haber Indie Boehne, [name] 08/21/2020

## 2020-09-13 ENCOUNTER — Encounter: Payer: Self-pay | Admitting: Psychiatry

## 2020-09-13 ENCOUNTER — Telehealth (INDEPENDENT_AMBULATORY_CARE_PROVIDER_SITE_OTHER): Payer: No Typology Code available for payment source | Admitting: Psychiatry

## 2020-09-13 ENCOUNTER — Other Ambulatory Visit: Payer: Self-pay

## 2020-09-13 DIAGNOSIS — F411 Generalized anxiety disorder: Secondary | ICD-10-CM | POA: Diagnosis not present

## 2020-09-13 DIAGNOSIS — F3342 Major depressive disorder, recurrent, in full remission: Secondary | ICD-10-CM

## 2020-09-13 NOTE — Progress Notes (Signed)
Virtual Visit via Video Mcclure  I connected with Cindy Mcclure on 09/13/20 at  2:20 PM EST by a video enabled telemedicine application and verified that I am speaking with the correct person using two identifiers.  Location Provider Location : [facility] Patient Location : Home  Participants: Patient , Provider   I discussed the limitations of evaluation and management by telemedicine and the availability of in person appointments. The patient expressed understanding and agreed to proceed.   I discussed the assessment and treatment plan with the patient. The patient was provided an opportunity to ask questions and all were answered. The patient agreed with the plan and demonstrated an understanding of the instructions.   The patient was advised to call back or seek an in-person evaluation if the symptoms worsen or if the condition fails to improve as anticipated.   Cindy Mcclure  09/13/2020 2:42 PM Cindy Mcclure  MRN:  [id number]  Chief Complaint:  Chief Complaint    Follow-up     HPI: Cindy Mcclure is a 55 year old Caucasian female on disability, lives in [place], has a history of MDD, GAD, seizure disorder, urinary incontinence, degenerative disc disease was evaluated by telemedicine today.  Patient today reports mood wise she is doing okay.  Patient reports she was able to taper herself off of the sertraline and currently is not taking it.  She reports she felt the sertraline was making her sluggish along with her other medications like her antiseizure medications.  Ever since she has been off of the sertraline she has been feeling better with regards to her sluggishness and drowsiness during the day.  She hence wants to stay off of it.  Patient reports she continues to follow-up with her therapist and reports therapy sessions as beneficial.  She reports sleep as overall okay.  She reports sleep may have been restless the past couple of nights however overall she is  taking 6-1/2 to 7 hours.  She reports appetite is fair.  Patient denies any suicidality, homicidality or perceptual disturbances.  Patient reports that her relationship with her daughter is getting better.  She denies any other concerns today.  Visit Diagnosis:    ICD-10-CM   1. MDD (major depressive disorder), recurrent, in full remission ([place])  F33.42   2. GAD (generalized anxiety disorder)  F41.1     Past Psychiatric History: I have reviewed past psychiatric history from my progress Mcclure on 11/04/2018  Past Medical History:  Past Medical History:  Diagnosis Date  . Allergic rhinitis   . Anginal pain [facility])    sees dr Clayborn Bigness  . Anxiety   . Arthritis    all over...knees, ankles, back  . Back pain   . Cervical radiculitis    no surgery, just cortisone shots.    . Constipation   . Constipation   . DDD (degenerative disc disease), cervical   . Dermatophytosis of nail 2019   both feet  . GERD (gastroesophageal reflux disease)   . History of kidney stones    h/o  . Osteoarthritis   . Panic attacks   . Plantar fasciitis    bilaterally  . Seizures ([place]) 1972   unsure of when she had last seizure due to petit mal type. does not know what brings on a seizure.  . Stress bladder incontinence, female   . Urinary incontinence     Past Surgical History:  Procedure Laterality Date  . APPENDECTOMY    . CESAREAN SECTION  [id number]  . CHOLECYSTECTOMY N/A 12/06/2015   Procedure: LAPAROSCOPIC CHOLECYSTECTOMY WITH INTRAOPERATIVE CHOLANGIOGRAM;  Surgeon: Dia Crawford III, MD;  Location: [facility] ORS;  Service: General;  Laterality: N/A;  . COLONOSCOPY WITH PROPOFOL N/A 09/29/2016   Procedure: COLONOSCOPY WITH PROPOFOL;  Surgeon: Manya Silvas, MD;  Location: [facility] ENDOSCOPY;  Service: Endoscopy;  Laterality: N/A;  . EXTERNAL FIXATION LEG Left 12/22/2019   Procedure: External fixation left spanning knee. CPT 20690 uniplane;  Surgeon: Leandrew Koyanagi, MD;  Location: [place];  Service:  Orthopedics;  Laterality: Left;  . EXTERNAL FIXATION REMOVAL Left 12/26/2019   Procedure: Removal External Fixation Leg;  Surgeon: Shona Needles, MD;  Location: [place];  Service: Orthopedics;  Laterality: Left;  . HARDWARE REMOVAL Left 05/21/2020   Procedure: HARDWARE REMOVAL PROXIMAL TIBIA;  Surgeon: Shona Needles, MD;  Location: [place];  Service: Orthopedics;  Laterality: Left;  . KNEE ARTHROSCOPY WITH EXCISION PLICA Left 8/58/8502   Procedure: KNEE ARTHROSCOPY WITH EXCISION PLICA, PARTIAL SYNOVECTOMY;  Surgeon: Hessie Knows, MD;  Location: [facility] ORS;  Service: Orthopedics;  Laterality: Left;  . KNEE ARTHROSCOPY WITH MEDIAL MENISECTOMY Right 09/03/2017   Procedure: KNEE ARTHROSCOPY WITH MEDIAL MENISECTOMY, PARTIAL SYNOVECTOMY;  Surgeon: Hessie Knows, MD;  Location: [facility] ORS;  Service: Orthopedics;  Laterality: Right;  . ORIF CALCANEOUS FRACTURE Right 12/26/2019   Procedure: OPEN REDUCTION INTERNAL FIXATION (ORIF) CALCANEOUS FRACTURE;  Surgeon: Shona Needles, MD;  Location: [place];  Service: Orthopedics;  Laterality: Right;  . ORIF TIBIA PLATEAU Left 12/26/2019   Procedure: OPEN REDUCTION INTERNAL FIXATION (ORIF) TIBIAL PLATEAU;  Surgeon: Shona Needles, MD;  Location: [place];  Service: Orthopedics;  Laterality: Left;  . SHOULDER ARTHROSCOPY WITH DEBRIDEMENT AND BICEP TENDON REPAIR Left 03/22/2019   Procedure: SHOULDER ARTHROSCOPY WITH DEBRIDEMENT, ROTATOR CUFF REPAIR, AND BICEP TENDON REPAIR;  Surgeon: Corky Mull, MD;  Location: [facility] ORS;  Service: Orthopedics;  Laterality: Left;  . SUBACROMIAL DECOMPRESSION  03/22/2019   Procedure: SUBACROMIAL DECOMPRESSION;  Surgeon: Corky Mull, MD;  Location: [facility] ORS;  Service: Orthopedics;;  . SURGERY FOR SEIZURES     nothing implanted in head. she was 55 years old. done [facility]  . TONSILLECTOMY    . TUBAL LIGATION  2003    Family Psychiatric History: I have reviewed family psychiatric history from my progress Mcclure on 11/04/2018  Family History:   Family History  Problem Relation Age of Onset  . Anxiety disorder Daughter     Social History: Reviewed social history from my progress Mcclure on 11/04/2018 Social History   Socioeconomic History  . Marital status: Divorced    Spouse name: Not on file  . Number of children: Not on file  . Years of education: Not on file  . Highest education level: Not on file  Occupational History  . Not on file  Tobacco Use  . Smoking status: Former Smoker    Packs/day: 0.25    Types: Cigarettes    Quit date: 08/17/2012    Years since quitting: 8.0  . Smokeless tobacco: Never Used  . Tobacco comment: pt states she smoked intermittently-smokers in home  Vaping Use  . Vaping Use: Never used  Substance and Sexual Activity  . Alcohol use: Yes    Comment: only on special occasions   . Drug use: No    Comment: denies  . Sexual activity: Not Currently  Other Topics Concern  . Not on file  Social History Narrative  . Not on file  Social Determinants of Health   Financial Resource Strain: Not on file  Food Insecurity: Not on file  Transportation Needs: Not on file  Physical Activity: Not on file  Stress: Not on file  Social Connections: Not on file    Allergies: No Known Allergies  Metabolic Disorder Labs: No results found for: HGBA1C, MPG No results found for: PROLACTIN No results found for: CHOL, TRIG, HDL, CHOLHDL, VLDL, LDLCALC Lab Results  Component Value Date   TSH 1.930 11/04/2018    Therapeutic Level Labs: No results found for: LITHIUM No results found for: VALPROATE No components found for:  CBMZ  Current Medications: Current Outpatient Medications  Medication Sig Dispense Refill  . cloBAZam (ONFI) 10 MG tablet TAKE 2.5 TABLET BY MOUTH EVERY NIGHT AT BEDTIME    . Calcium Carb-Cholecalciferol (CALCIUM 600 + D PO) Take 600 mg by mouth daily.     . cloBAZam (ONFI) 10 MG tablet TAKE 2.5 TABLET BY MOUTH EVERY NIGHT AT BEDTIME    . cloBAZam (ONFI) 20 MG tablet Take 25  mg by mouth daily with supper.     . diclofenac (VOLTAREN) 75 MG EC tablet Take 75 mg by mouth 2 (two) times daily.    . diclofenac Sodium (VOLTAREN) 1 % GEL Apply 2 g topically 4 (four) times daily as needed (Pain).     Marland Kitchen docusate sodium (COLACE) 100 MG capsule Take 1 capsule (100 mg total) by mouth 2 (two) times daily. (Patient taking differently: Take 100 mg by mouth daily as needed for mild constipation. ) 14 capsule 0  . enoxaparin (LOVENOX) 40 MG/0.4ML injection Inject 0.4 mLs (40 mg total) into the skin daily. 12 mL 0  . lamoTRIgine (LAMICTAL) 150 MG tablet Take 150 mg by mouth 2 (two) times daily.     . Levetiracetam (KEPPRA XR) 750 MG TB24 Take 750 mg by mouth 2 (two) times daily.    . Multiple Vitamins-Minerals (CENTRUM ADULTS PO) Take 1 tablet by mouth daily.     Marland Kitchen omeprazole (PRILOSEC) 20 MG capsule Take 20 mg by mouth daily with supper.     . pyridoxine (B-6) 100 MG tablet Take by mouth.    . traMADol (ULTRAM) 50 MG tablet Take 50 mg by mouth daily.     . Vitamin A 2400 MCG (8000 UT) CAPS Take 8,000 Units by mouth daily.     . vitamin B-12 (CYANOCOBALAMIN) 500 MCG tablet Take 500 mcg by mouth daily.     . vitamin C (ASCORBIC ACID) 500 MG tablet Take 500 mg by mouth daily.    . Vitamin E 180 MG (400 UNIT) CAPS Take 180 mcg by mouth daily.     Marland Kitchen zonisamide (ZONEGRAN) 100 MG capsule Take 400 mg by mouth daily with supper.     No current facility-administered medications for this visit.     Musculoskeletal: Strength & Muscle Tone: UTA Gait & Station: UTA Patient leans: N/A  Psychiatric Specialty Exam: Review of Systems  Psychiatric/Behavioral: Negative for agitation, behavioral problems, confusion, decreased concentration, dysphoric mood, hallucinations, self-injury, sleep disturbance and suicidal ideas. The patient is not nervous/anxious and is not hyperactive.   All other systems reviewed and are negative.   There were no vitals taken for this visit.There is no height or  weight on file to calculate BMI.  General Appearance: Casual  Eye Contact:  Fair  Speech:  Clear and Coherent  Volume:  Normal  Mood:  Euthymic  Affect:  Congruent  Thought Process:  Goal Directed  and Descriptions of Associations: Intact  Orientation:  Full (Time, Place, and Person)  Thought Content: Logical   Suicidal Thoughts:  No  Homicidal Thoughts:  No  Memory:  Immediate;   Fair Recent;   Fair Remote;   Fair  Judgement:  Fair  Insight:  Fair  Psychomotor Activity:  Normal  Concentration:  Concentration: Fair and Attention Span: Fair  Recall:  [employer] of Knowledge: Fair  Language: Fair  Akathisia:  No  Handed:  Right  AIMS (if indicated): UTA  Assets:  Communication Skills Desire for Improvement Housing Social Support  ADL's:  Intact  Cognition: WNL  Sleep:  Fair   Screenings: GAD-7   Flowsheet Row Video Visit from 09/13/2020 in [place]  Total GAD-7 Score 0    PHQ2-9   Flowsheet Row Video Visit from 09/13/2020 in [place] Office Visit from 05/23/2019 in [place]  PHQ-2 Total Score 0 2  PHQ-9 Total Score 0 6       Assessment and Plan: Cindy Mcclure is a 55 year old Caucasian female, widowed, lives in [place], has a history of seizure disorder, MDD, GAD, degenerative disc disease, chronic pain, recent fracture of lower extremity was evaluated by telemedicine today.  Patient is biologically predisposed given multiple health issues, chronic pain, family history of mental health problems.  Patient with psychosocial stressors of relationship struggles, recent health problems is currently doing well.  Patient was able to taper herself off of the Zoloft and reports she is doing well without medications.  Plan as noted below.  Plan MDD in remission Patient advised to continue CBT. Discontinue Zoloft for noncompliance. PHQ 9 today equals 0  GAD-stable Continue CBT  as needed. GAD 7-0  Follow-up in clinic as needed.  I have spent atleast 10 minutes  face to face by video with patient today. More than 50 % of the time was spent for preparing to see the patient ( e.g., review of test, records ), ordering medications and test ,psychoeducation and supportive psychotherapy and care coordination,as well as documenting clinical information in electronic health record. This Mcclure was generated in part or whole with voice recognition software. Voice recognition is usually quite accurate but there are transcription errors that can and very often do occur. I apologize for any typographical errors that were not detected and corrected.      Ursula Alert, MD 09/14/2020, 1:19 PM

## 2020-09-17 ENCOUNTER — Other Ambulatory Visit: Payer: Self-pay | Admitting: Student

## 2020-09-17 DIAGNOSIS — M25511 Pain in right shoulder: Secondary | ICD-10-CM

## 2020-09-17 DIAGNOSIS — M7581 Other shoulder lesions, right shoulder: Secondary | ICD-10-CM

## 2020-09-17 DIAGNOSIS — W19XXXS Unspecified fall, sequela: Secondary | ICD-10-CM

## 2020-09-23 ENCOUNTER — Ambulatory Visit
Admission: RE | Admit: 2020-09-23 | Discharge: 2020-09-23 | Disposition: A | Discharge status: Home / Self Care | Payer: Medicare Other | Source: Ambulatory Visit | Attending: Student | Admitting: Student

## 2020-09-23 ENCOUNTER — Other Ambulatory Visit: Payer: Self-pay

## 2020-09-23 DIAGNOSIS — W19XXXS Unspecified fall, sequela: Secondary | ICD-10-CM | POA: Insufficient documentation

## 2020-09-23 DIAGNOSIS — M7581 Other shoulder lesions, right shoulder: Secondary | ICD-10-CM

## 2020-09-23 DIAGNOSIS — M67813 Other specified disorders of tendon, right shoulder: Secondary | ICD-10-CM | POA: Diagnosis not present

## 2020-09-23 DIAGNOSIS — S46011A Strain of muscle(s) and tendon(s) of the rotator cuff of right shoulder, initial encounter: Secondary | ICD-10-CM | POA: Diagnosis not present

## 2020-09-23 DIAGNOSIS — M25511 Pain in right shoulder: Secondary | ICD-10-CM

## 2020-10-18 ENCOUNTER — Ambulatory Visit: Payer: 59 | Admitting: Licensed Clinical Social Worker

## 2020-10-25 ENCOUNTER — Other Ambulatory Visit: Payer: Self-pay

## 2020-10-25 ENCOUNTER — Ambulatory Visit (INDEPENDENT_AMBULATORY_CARE_PROVIDER_SITE_OTHER): Payer: Medicare Other | Admitting: Licensed Clinical Social Worker

## 2020-10-25 DIAGNOSIS — F411 Generalized anxiety disorder: Secondary | ICD-10-CM

## 2020-10-25 DIAGNOSIS — F3342 Major depressive disorder, recurrent, in full remission: Secondary | ICD-10-CM | POA: Diagnosis not present

## 2020-10-25 NOTE — Progress Notes (Signed)
Virtual Visit via Video Note  I connected with Cindy Mcclure on 10/25/20 at  3:00 PM EST by a video enabled telemedicine application and verified that I am speaking with the correct person using two identifiers.  Location: Patient: home Provider: [facility]   I discussed the limitations of evaluation and management by telemedicine and the availability of in person appointments. The patient expressed understanding and agreed to proceed.   I discussed the assessment and treatment plan with the patient. The patient was provided an opportunity to ask questions and all were answered. The patient agreed with the plan and demonstrated an understanding of the instructions.   The patient was advised to call back or seek an in-person evaluation if the symptoms worsen or if the condition fails to improve as anticipated.  I provided 30 minutes of non-face-to-face time during this encounter.   Remus Hagedorn R Aithan Farrelly, [name]   THERAPIST PROGRESS NOTE  Session Time: 3-3:30p  Participation Level: Active  Behavioral Response: CasualAlertAnxious and Depressed  Type of Therapy: Individual Therapy  Treatment Goals addressed: Anxiety and Coping  Interventions: Supportive  Summary: Cindy Mcclure is a 55 y.o. female who presents with improving symptoms related to depression and anxiety.  Pt reports that mood is stable and that she is managing breakthrough depression symptoms and anxiety well.  Pt reports that daughter is not returning to college at this point in time--she will explore other options due to her college not having the classes that she wants to take.   Discussed health and safety--pt recently had a fall inside her home. Explored overall home safety and ways that pt could be aware of hazards within the home and make changes that could be beneficial to overall safety.  Continued recommendations are as follows: self care behaviors, positive social engagements, focusing on overall work/home/life  balance, and focusing on positive physical and emotional wellness.   Suicidal/Homicidal: No  Therapist Response: Cindy Mcclure is able to identify coping mechanisms that have worked to manage mood and anxiety in the past and can utilize as needed.   Plan: Return again in 6 weeks.  Diagnosis: Axis I: Generalized Anxiety Disorder and Major Depression, Recurrent, in remission    Axis II: No diagnosis    Rachel Bo Lulabelle Desta, [name] 10/25/2020

## 2020-10-29 ENCOUNTER — Other Ambulatory Visit: Payer: Self-pay | Admitting: Surgery

## 2020-11-06 ENCOUNTER — Other Ambulatory Visit
Admission: RE | Admit: 2020-11-06 | Discharge: 2020-11-06 | Disposition: A | Discharge status: Home / Self Care | Payer: Medicare Other | Source: Ambulatory Visit | Attending: Surgery | Admitting: Surgery

## 2020-11-06 ENCOUNTER — Other Ambulatory Visit: Payer: Self-pay

## 2020-11-06 NOTE — Patient Instructions (Signed)
Your procedure is scheduled on: Tuesday 11/13/20.  Report to THE FIRST FLOOR REGISTRATION DESK IN THE MEDICAL MALL ON THE MORNING OF SURGERY FIRST, THEN YOU WILL CHECK IN AT THE SURGERY INFORMATION DESK LOCATED OUTSIDE THE SAME DAY SURGERY DEPARTMENT LOCATED ON 2ND FLOOR MEDICAL MALL ENTRANCE.   To find out your arrival time please call [phone] between 1PM - 3PM on Monday 11/12/20.   Remember: Instructions that are not followed completely may result in serious medical risk, up to and including death, or upon the discretion of your surgeon and anesthesiologist your surgery may need to be rescheduled.     __X__ 1. Do not eat food after midnight the night before your procedure.                 No gum chewing or hard candies. You may drink clear liquids up to 2 hours                 before you are scheduled to arrive for your surgery- DO NOT drink clear                 liquids within 2 hours of the start of your surgery.                 Clear Liquids include:  water, apple juice without pulp, clear carbohydrate                 drink such as Clearfast or Gatorade, Black Coffee or Tea (Do not add                 milk or creamer to coffee or tea).  ** Dr. Roland Rack wants you to finish the Pre-Surgery Ensure 2 hours prior to arrival on the morning of surgery.     __X__2.  On the morning of surgery brush your teeth with toothpaste and water, you may rinse your mouth with mouthwash if you wish.  Do not swallow any toothpaste or mouthwash.    __X__ 3.  No Alcohol for 24 hours before or after surgery.  __X__ 4.  Do Not Smoke or use e-cigarettes For 24 Hours Prior to Your Surgery.                 Do not use any chewable tobacco products for at least 6 hours prior to                 surgery.  __X__5.  Notify your doctor if there is any change in your medical condition      (cold, fever, infections).      Do NOT wear jewelry, make-up, hairpins, clips or nail polish. Do NOT wear lotions, powders,  or perfumes.  Do NOT shave 48 hours prior to surgery. Men may shave face and neck. Do NOT bring valuables to the hospital.     [facility] is not responsible for any belongings or valuables.   Contacts, dentures/partials or body piercings may not be worn into surgery. Bring a case for your contacts, glasses or hearing aids, a denture cup will be supplied.    Patients discharged the day of surgery will not be allowed to drive home.     __X__ Take these medicines the morning of surgery with A SIP OF WATER:     1. lamoTRIgine (LAMICTAL)  2. Levetiracetam   3. omeprazole (PRILOSEC)  4. traMADol (ULTRAM)  5.  6.    __X__ Use CHG Soap as directed.  __X__  Stop Anti-inflammatories 7 days before surgery such as Advil, Ibuprofen, Motrin, BC or Goodies Powder, Naprosyn, Naproxen, Aleve, Aspirin, Meloxicam. May take Tylenol & Tramadol if needed for pain or discomfort. STOP diclofenac (VOLTAREN).   __X__Do not start taking any new herbal supplements or vitamins prior to your procedure.  __X__ Stop the following herbal supplements or vitamins:  vitamin C (ASCORBIC ACID)  Vitamin A 2400 MCG (8000 UT)    Wear comfortable clothing (specific to your surgery type) to the hospital.  Plan for stool softeners for home use; pain medications have a tendency to cause constipation. You can also help prevent constipation by eating foods high in fiber such as fruits and vegetables and drinking plenty of fluids as your diet allows.  After surgery, you can prevent lung complications by doing breathing exercises.Take deep breaths and cough every 1-2 hours. Your doctor may order a device called an Incentive Spirometer to help you take deep breaths.  Please call the [place] Department at [phone] if you have any questions about these instructions.

## 2020-11-08 ENCOUNTER — Encounter: Payer: Self-pay | Admitting: Surgery

## 2020-11-08 NOTE — Progress Notes (Signed)
Perioperative Services  Pre-Admission/Anesthesia Testing Clinical Review  Date: 11/09/20  Patient Demographics:  Name: Cindy Mcclure DOB:   1966-05-01 MRN:   [id number]  Planned Surgical Procedure(s):    Case: [id number] Date/Time: 11/13/20 1405   Procedure: SHOULDER ARTHROSCOPY WITH DEBRIDEMENT, DECOMPRESSION, POSSIBLE ROTATOR CUFF REPAIR, AND POSSIBLE  BICEP TENODESIS. (Right Shoulder)   Anesthesia type: Choice   Pre-op diagnosis:      Traumatic incomplete tear of right rotator cuff, sequela S46.011S     Right rotator cuff tendonitis M75.81     Biceps tendinitis of right upper extremity M75.21   Location: [facility] OR ROOM 03 / [place] [facility]   Surgeons: Corky Mull, MD    NOTE: Available PAT nursing documentation and vital signs have been reviewed. Clinical nursing staff has updated patient's PMH/PSHx, current medication list, and drug allergies/intolerances to ensure comprehensive history available to assist in medical decision making as it pertains to the aforementioned surgical procedure and anticipated anesthetic course.   Clinical Discussion:  Cindy Mcclure is a 55 y.o. female who is submitted for pre-surgical anesthesia review and clearance prior to her undergoing the above procedure. Patient is a Former Smoker (quit 07/2012). Pertinent PMH includes: angina, non rheumatic mitral valve regurgitation, seizures, GERD (on daily PPI), OA, DDD, anxiety and panic attacks, depression.  Patient is followed by cardiology Clayborn Bigness, MD). She was last seen in the cardiology clinic on 11/01/2020; notes reviewed.  At the time of her clinic visit, patient noted to be doing well overall from a cardiovascular perspective. She denied chest pain, orthopnea, PND, palpitations, and presyncope/syncope.  She complained of intermittent episodes of vertiginous symptoms, shortness of breath, and peripheral edea, all of which are chronic and reportedly at baseline. Patient with history of  chronic depression and anxiety, currently seeing a therapist.  Patient resides with daughter and is on disability.  PMH (+) for non rheumatic valvular insufficiency.  Last noninvasive work-up was performed and 03/2018.  Myocardial perfusion imaging study revealed no evidence of stress-induced myocardial ischemia or arrhythmia; LVEF 76%.  TTE demonstrated a normal left ventricular systolic function with mild to moderate valvular insufficiency; LVEF >55% (see full interpretation of cardiovascular test below).  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  She is currently under the care of neurology at [facility].  Blood pressure well controlled at 102/60.  Functional capacity reported to be limited by orthopedic pain, however patient felt to be able to achieve at least 4 METS of activity.  No changes were made to patient medication regimen.  With that being said, cardiologist noted that patient with chronic recurrent angina at rest.  Conservative management recommended for now, however patient continued to experience symptoms, further work-up via functional studies would be recommended.  Patient to follow-up with outpatient cardiology in 4 months or sooner if needed.  Patient is scheduled to undergo an elective orthopedic procedure on 11/13/2020 with Dr. Milagros Evener.  Given patient's past medical history significant for cardiovascular diagnoses, presurgical clearances were sought from both cardiology and neurology by the PAT team.  Specialty clearances were obtained as follows:    Per cardiology, "this patient is optimized for surgery and may proceed with the planned procedural course with a LOW risk stratification".  This patient is not on any type of anticoagulation/antiplatelet therapy.   Per neurology, "patient will need to continue her anti-seizure medications without interruption. She is optimized for surgery from a neurology perspective and may proceed with planned surgical  intervention with an overall LOW risk for complications".   Patient denies previous perioperative complications with anesthesia in the past. In review of the available records, it is noted that patient underwent a general anesthetic course at [facility] in 05/2020 without documented complications.   Vitals with BMI 11/06/2020 05/21/2020 05/21/2020  Height 4\' 11"  - -  Weight 179 lbs - -  BMI 54.09 - -  Systolic - 811 914  Diastolic - 72 77  Pulse - 79 63  Some encounter information is confidential and restricted. Go to Review Flowsheets activity to see all data.    Providers/Specialists:   NOTE: Primary physician provider listed below. Patient may have been seen by APP or partner within same practice.   PROVIDER ROLE / SPECIALTY LAST OV  Poggi, Marshall Cork, MD Orthopedics (Surgeon)  11/07/2020  [facility], [place] [facility] Primary Care Provider  ???  Katrine Coho, MD Cardiology  11/01/2020  Jeoffrey Massed, MD Neurology   10/18/2020   Allergies:  Patient has no known allergies.  Current Home Medications:   No current facility-administered medications for this encounter.   . Calcium Carb-Cholecalciferol (CALCIUM 600 + D PO)  . cloBAZam (ONFI) 10 MG tablet  . diclofenac (VOLTAREN) 75 MG EC tablet  . lamoTRIgine (LAMICTAL) 150 MG tablet  . Levetiracetam 750 MG TB24  . Multiple Vitamins-Minerals (CENTRUM ADULTS PO)  . omeprazole (PRILOSEC) 20 MG capsule  . pyridoxine (B-6) 100 MG tablet  . traMADol (ULTRAM) 50 MG tablet  . Vitamin A 2400 MCG (8000 UT) CAPS  . vitamin B-12 (CYANOCOBALAMIN) 500 MCG tablet  . vitamin C (ASCORBIC ACID) 500 MG tablet  . Vitamin E 180 MG (400 UNIT) CAPS  . zonisamide (ZONEGRAN) 100 MG capsule   History:   Past Medical History:  Diagnosis Date  . Allergic rhinitis   . Anginal pain [facility])    sees dr Clayborn Bigness  . Anxiety   . Arthritis    all over...knees, ankles, back  . Back pain   . Cervical radiculitis    no  surgery, just cortisone shots.    . Constipation   . Constipation   . DDD (degenerative disc disease), cervical   . Depression   . Dermatophytosis of nail 2019   both feet  . GERD (gastroesophageal reflux disease)   . History of kidney stones    h/o  . Nonrheumatic mitral valve regurgitation   . Osteoarthritis   . Panic attacks   . Plantar fasciitis    bilaterally  . Seizures ([place]) 1972   unsure of when she had last seizure due to petit mal type. does not know what brings on a seizure.  . Stress bladder incontinence, female   . Urinary incontinence    Past Surgical History:  Procedure Laterality Date  . APPENDECTOMY    . CESAREAN SECTION  [id number]  . CHOLECYSTECTOMY N/A 12/06/2015   Procedure: LAPAROSCOPIC CHOLECYSTECTOMY WITH INTRAOPERATIVE CHOLANGIOGRAM;  Surgeon: Dia Crawford III, MD;  Location: [facility] ORS;  Service: General;  Laterality: N/A;  . COLONOSCOPY WITH PROPOFOL N/A 09/29/2016   Procedure: COLONOSCOPY WITH PROPOFOL;  Surgeon: Manya Silvas, MD;  Location: [facility];  Service: Endoscopy;  Laterality: N/A;  . EXTERNAL FIXATION LEG Left 12/22/2019   Procedure: External fixation left spanning knee. CPT 20690 uniplane;  Surgeon: Leandrew Koyanagi, MD;  Location: [place];  Service: Orthopedics;  Laterality: Left;  . EXTERNAL FIXATION REMOVAL Left 12/26/2019   Procedure: Removal External Fixation Leg;  Surgeon:  Haddix, Thomasene Lot, MD;  Location: [place];  Service: Orthopedics;  Laterality: Left;  . HARDWARE REMOVAL Left 05/21/2020   Procedure: HARDWARE REMOVAL PROXIMAL TIBIA;  Surgeon: Shona Needles, MD;  Location: [place];  Service: Orthopedics;  Laterality: Left;  . KNEE ARTHROSCOPY WITH EXCISION PLICA Left 0/25/4270   Procedure: KNEE ARTHROSCOPY WITH EXCISION PLICA, PARTIAL SYNOVECTOMY;  Surgeon: Hessie Knows, MD;  Location: [facility] ORS;  Service: Orthopedics;  Laterality: Left;  . KNEE ARTHROSCOPY WITH MEDIAL MENISECTOMY Right 09/03/2017   Procedure: KNEE ARTHROSCOPY WITH MEDIAL  MENISECTOMY, PARTIAL SYNOVECTOMY;  Surgeon: Hessie Knows, MD;  Location: [facility] ORS;  Service: Orthopedics;  Laterality: Right;  . ORIF CALCANEOUS FRACTURE Right 12/26/2019   Procedure: OPEN REDUCTION INTERNAL FIXATION (ORIF) CALCANEOUS FRACTURE;  Surgeon: Shona Needles, MD;  Location: [place];  Service: Orthopedics;  Laterality: Right;  . ORIF TIBIA PLATEAU Left 12/26/2019   Procedure: OPEN REDUCTION INTERNAL FIXATION (ORIF) TIBIAL PLATEAU;  Surgeon: Shona Needles, MD;  Location: [place];  Service: Orthopedics;  Laterality: Left;  . SHOULDER ARTHROSCOPY WITH DEBRIDEMENT AND BICEP TENDON REPAIR Left 03/22/2019   Procedure: SHOULDER ARTHROSCOPY WITH DEBRIDEMENT, ROTATOR CUFF REPAIR, AND BICEP TENDON REPAIR;  Surgeon: Corky Mull, MD;  Location: [facility] ORS;  Service: Orthopedics;  Laterality: Left;  . SUBACROMIAL DECOMPRESSION  03/22/2019   Procedure: SUBACROMIAL DECOMPRESSION;  Surgeon: Corky Mull, MD;  Location: [facility] ORS;  Service: Orthopedics;;  . SURGERY FOR SEIZURES     nothing implanted in head. she was 55 years old. done [facility]  . TONSILLECTOMY    . TUBAL LIGATION  2003   Family History  Problem Relation Age of Onset  . Anxiety disorder Daughter    Social History   Tobacco Use  . Smoking status: Former Smoker    Packs/day: 0.25    Types: Cigarettes    Quit date: 08/17/2012    Years since quitting: 8.2  . Smokeless tobacco: Never Used  . Tobacco comment: pt states she smoked intermittently-smokers in home  Vaping Use  . Vaping Use: Never used  Substance Use Topics  . Alcohol use: Yes    Comment: only on special occasions   . Drug use: No    Comment: denies    Pertinent Clinical Results:  LABS: Labs reviewed: Acceptable for surgery.  Hospital Outpatient Visit on 11/09/2020  Component Date Value Ref Range Status  . Sodium 11/09/2020 140  135 - 145 mmol/L Final  . Potassium 11/09/2020 3.8  3.5 - 5.1 mmol/L Final  . Chloride 11/09/2020 109  98 - 111 mmol/L Final  . CO2  11/09/2020 23  22 - 32 mmol/L Final  . Glucose, Bld 11/09/2020 107* 70 - 99 mg/dL Final   Glucose reference range applies only to samples taken after fasting for at least 8 hours.  . BUN 11/09/2020 25* 6 - 20 mg/dL Final  . Creatinine, Ser 11/09/2020 1.12* 0.44 - 1.00 mg/dL Final  . Calcium 11/09/2020 9.0  8.9 - 10.3 mg/dL Final  . GFR, Estimated 11/09/2020 58* >60 mL/min Final   Comment: (NOTE) Calculated using the CKD-EPI Creatinine Equation (2021)   . Anion gap 11/09/2020 8  5 - 15 Final   Performed at [facility], [address] [place], [place] [address]  . WBC 11/09/2020 8.1  4.0 - 10.5 K/uL Final  . RBC 11/09/2020 4.03  3.87 - 5.11 MIL/uL Final  . Hemoglobin 11/09/2020 12.6  12.0 - 15.0 g/dL Final  . HCT 11/09/2020 37.9  36.0 -  46.0 % Final  . MCV 11/09/2020 94.0  80.0 - 100.0 fL Final  . MCH 11/09/2020 31.3  26.0 - 34.0 pg Final  . MCHC 11/09/2020 33.2  30.0 - 36.0 g/dL Final  . RDW 11/09/2020 13.5  11.5 - 15.5 % Final  . Platelets 11/09/2020 296  150 - 400 K/uL Final  . nRBC 11/09/2020 0.0  0.0 - 0.2 % Final   Performed at [facility], [address] [place], [place] [address]    ECG: Date: 11/09/2020  Time ECG obtained: 0911 AM Rate: 78 bpm Rhythm: normal sinus Axis (leads I and aVF): Normal Intervals: PR 154 ms. QRS 76 ms. QTc 440 ms. ST segment and T wave changes: No evidence of acute ST segment elevation or depression Comparison: Previously noted nonspecific T wave abnormality noted on 12/28/2019 no longer present/evident.   IMAGING / PROCEDURES: MRI SHOULDER RIGHT WITHOUT CONTRAST performed on 09/23/2020 1. Severe tendinosis of the supraspinatus tendon with a partial-thickness bursal surface tear 2. Mild tendinosis of the infraspinatus tendon 3. Teres minor and subscapularis tendons are intact 4. Mild arthropathy of the acromioclavicular joint 5. Type I acromion on 6. Trace subacromial/subdeltoid bursal fluid  DIAGNOSTIC RADIOGRAPHS OF  LEFT SHOULDER performed on 09/17/2020 1. True AP, Y, and axillary views of the left shoulder were obtained. 2. No evidence for fractures, lytic lesions, or significant degenerative changes 3. Subacromial space is mildly decreased 4. No subacromial or infraclavicular spurring 5. Type I acromion  ECHOCARDIOGRAM performed on 04/08/2018 1. Normal left ventricular systolic function with an estimated EF of >55% 2. Normal right ventricular systolic function 3. Mild to moderate mitral valve insufficiency 4. Mild tricuspid valve insufficiency 5. Trivial pulmonic valve insufficiency 6. No aortic insufficiency 7. No valvular stenosis 8. No evidence of pericardial effusion  LEXISCAN performed on 04/01/2018 1. LVEF 76% 2. Regional wall motion reveals normal myocardial thickening and wall motion 3. No artifacts noted 4. Left ventricular cavity size normal 5. No evidence of stress-induced myocardial ischemia or arrhythmia 6. The overall quality of the study is good  Impression and Plan:  Cindy Mcclure has been referred for pre-anesthesia review and clearance prior to her undergoing the planned anesthetic and procedural courses. Available labs, pertinent testing, and imaging results were personally reviewed by me. This patient has been appropriately cleared by cardiology and neurology, with both specialities issuing a LOW risk stratification of significant perioperative complications.  Based on clinical review performed today (11/09/20), barring any significant acute changes in the patient's overall condition, it is anticipated that she will be able to proceed with the planned surgical intervention. Any acute changes in clinical condition may necessitate her procedure being postponed and/or cancelled. Pre-surgical instructions were reviewed with the patient during her PAT appointment and questions were fielded by PAT clinical staff.  Honor Loh, MSN, APRN, FNP-C, CEN [facility]   Peri-operative Services Nurse Practitioner Phone: [phone] 11/09/20 12:14 PM  NOTE: This note has been prepared using Dragon dictation software. Despite my best ability to proofread, there is always the potential that unintentional transcriptional errors may still occur from this process.

## 2020-11-09 ENCOUNTER — Other Ambulatory Visit: Payer: Self-pay

## 2020-11-09 ENCOUNTER — Other Ambulatory Visit
Admission: RE | Admit: 2020-11-09 | Discharge: 2020-11-09 | Disposition: A | Discharge status: Home / Self Care | Payer: Medicare Other | Source: Ambulatory Visit | Attending: Surgery | Admitting: Surgery

## 2020-11-09 DIAGNOSIS — Z20822 Contact with and (suspected) exposure to covid-19: Secondary | ICD-10-CM | POA: Diagnosis not present

## 2020-11-09 DIAGNOSIS — Z01818 Encounter for other preprocedural examination: Secondary | ICD-10-CM | POA: Diagnosis present

## 2020-11-09 LAB — CBC
HCT: 37.9 % (ref 36.0–46.0)
Hemoglobin: 12.6 g/dL (ref 12.0–15.0)
MCH: 31.3 pg (ref 26.0–34.0)
MCHC: 33.2 g/dL (ref 30.0–36.0)
MCV: 94 fL (ref 80.0–100.0)
Platelets: 296 10*3/uL (ref 150–400)
RBC: 4.03 MIL/uL (ref 3.87–5.11)
RDW: 13.5 % (ref 11.5–15.5)
WBC: 8.1 10*3/uL (ref 4.0–10.5)
nRBC: 0 % (ref 0.0–0.2)

## 2020-11-09 LAB — BASIC METABOLIC PANEL
Anion gap: 8 (ref 5–15)
BUN: 25 mg/dL — ABNORMAL HIGH (ref 6–20)
CO2: 23 mmol/L (ref 22–32)
Calcium: 9 mg/dL (ref 8.9–10.3)
Chloride: 109 mmol/L (ref 98–111)
Creatinine, Ser: 1.12 mg/dL — ABNORMAL HIGH (ref 0.44–1.00)
GFR, Estimated: 58 mL/min — ABNORMAL LOW (ref >60)
Glucose, Bld: 107 mg/dL — ABNORMAL HIGH (ref 70–99)
Potassium: 3.8 mmol/L (ref 3.5–5.1)
Sodium: 140 mmol/L (ref 135–145)

## 2020-11-09 LAB — SARS CORONAVIRUS 2 (TAT 6-24 HRS): SARS Coronavirus 2: NEGATIVE

## 2020-11-12 IMAGING — RF DG C-ARM 1-60 MIN
1 series · 9 of 9 positions shown · non-contrast
Comparison: Left knee CT 12/23/2019.

CLINICAL DATA: ORIF left tibial plateau fracture.

EXAM:
DG C-ARM 1-60 MIN; LEFT TIBIA AND FIBULA - 2 VIEW

[Series 1: run · 9 of 9 slices shown]
[im 1/9]
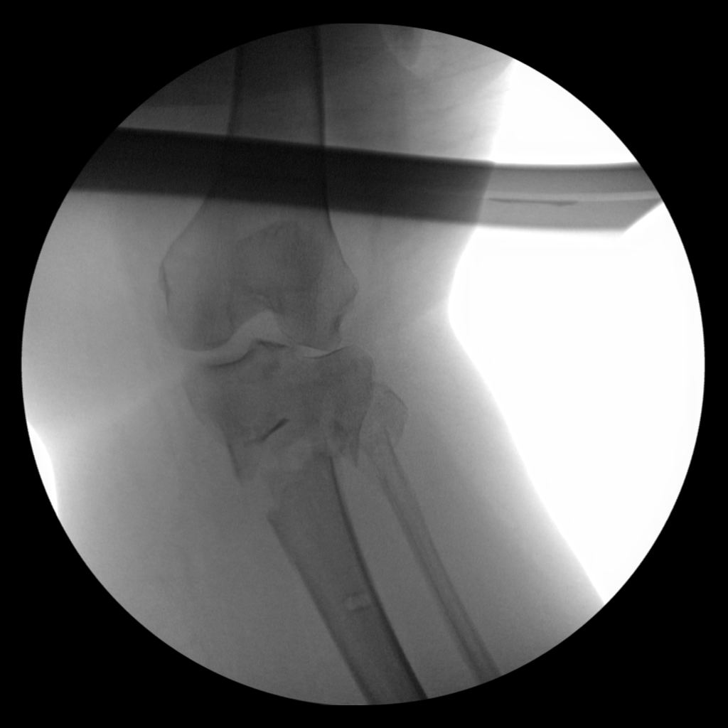
[im 2/9]
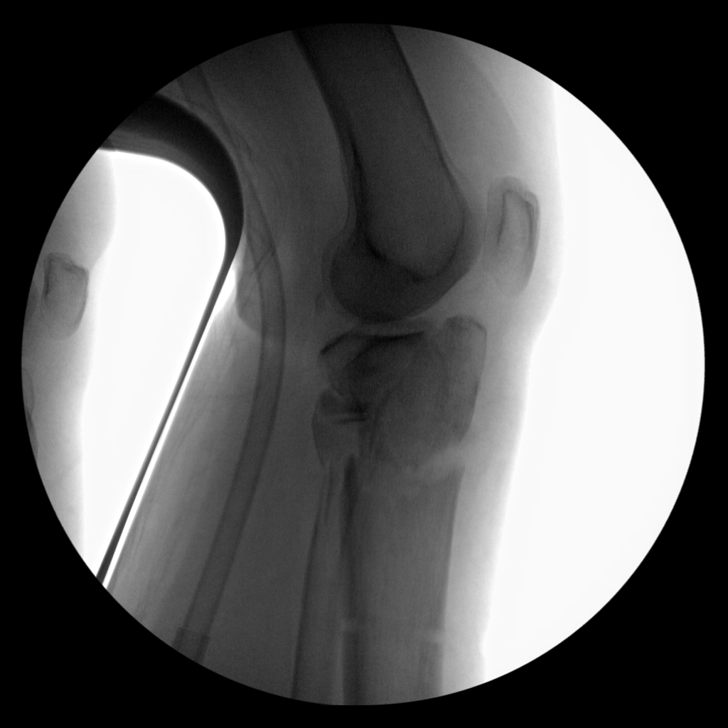
[im 3/9]
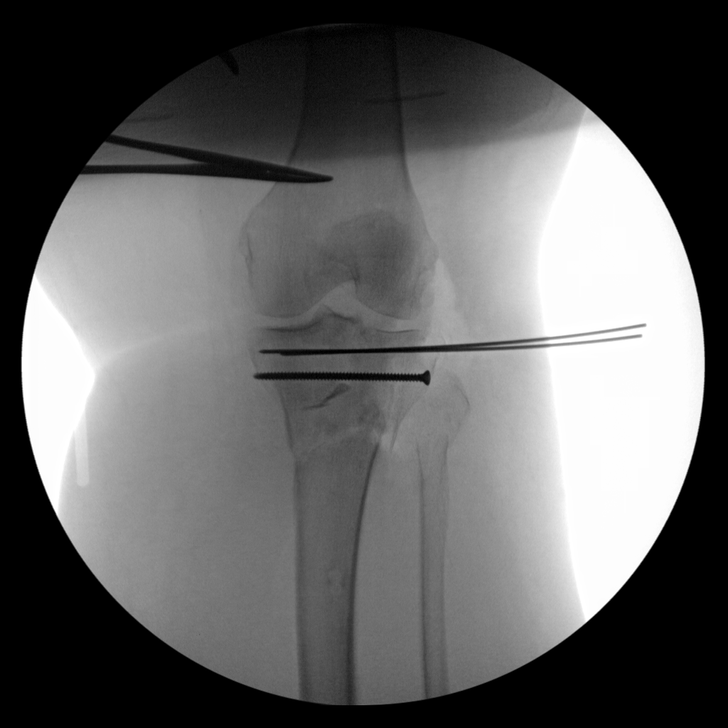
[im 4/9]
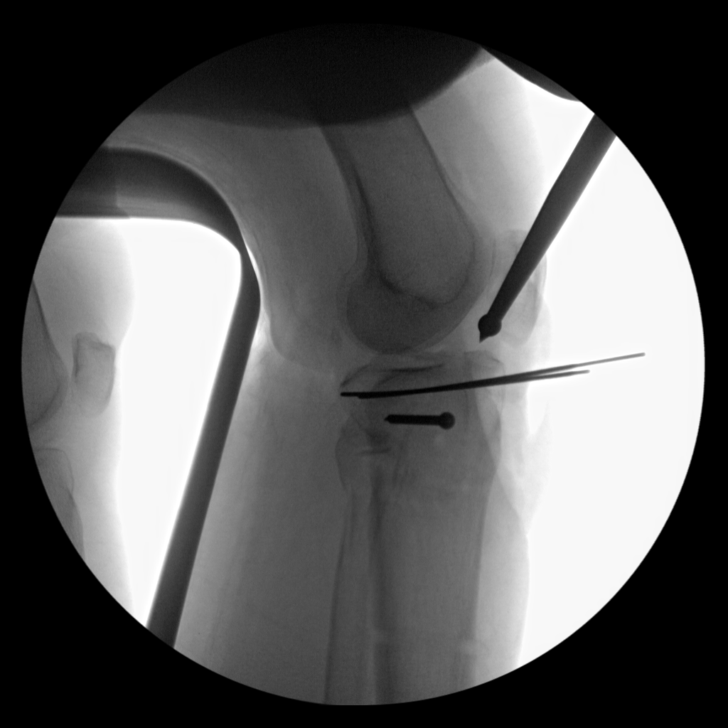
[im 5/9]
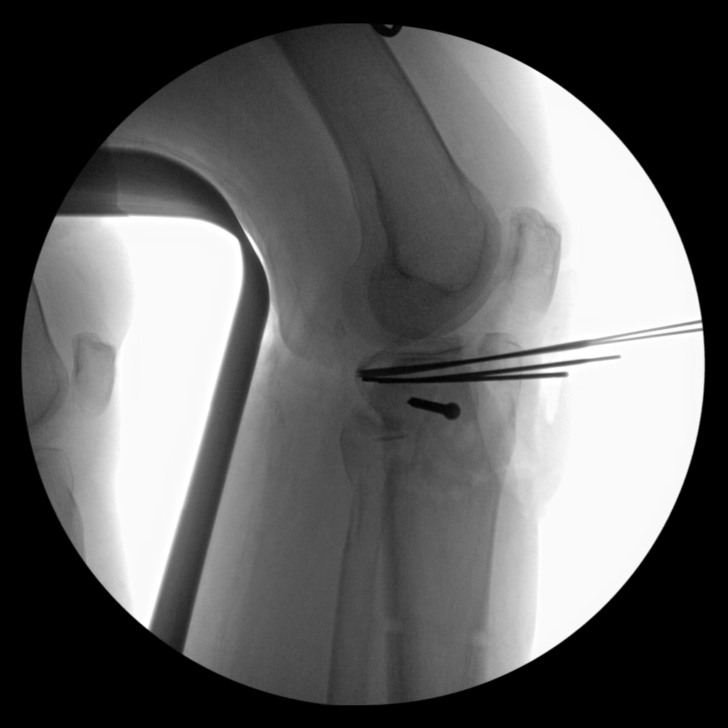
[im 6/9]
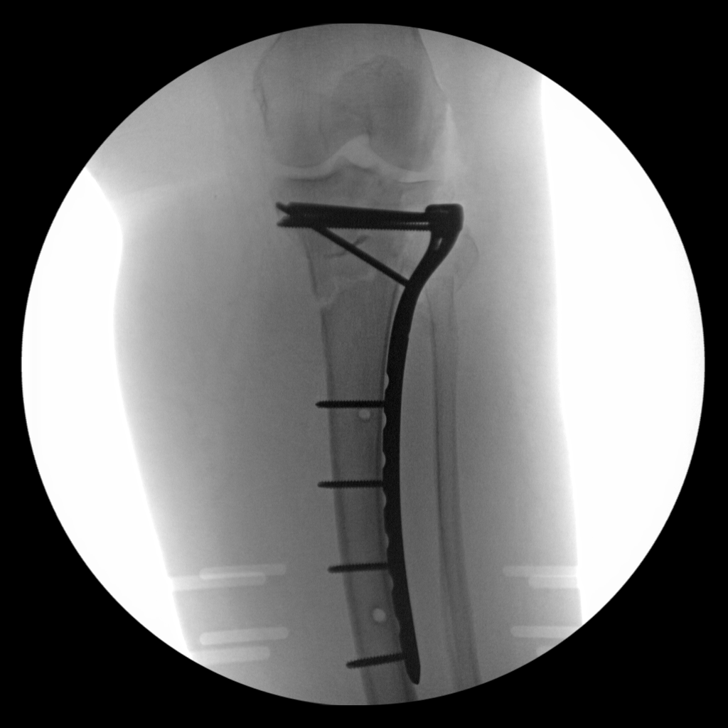
[im 7/9]
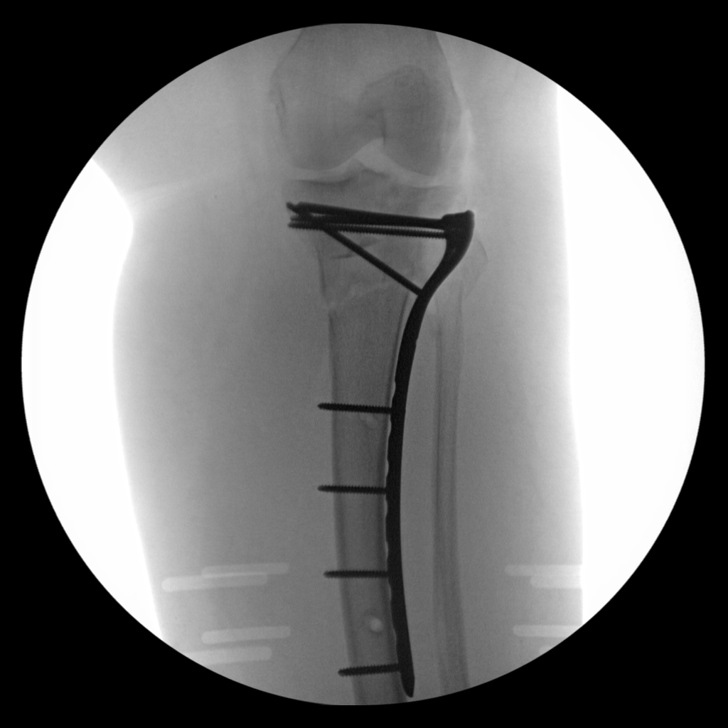
[im 8/9]
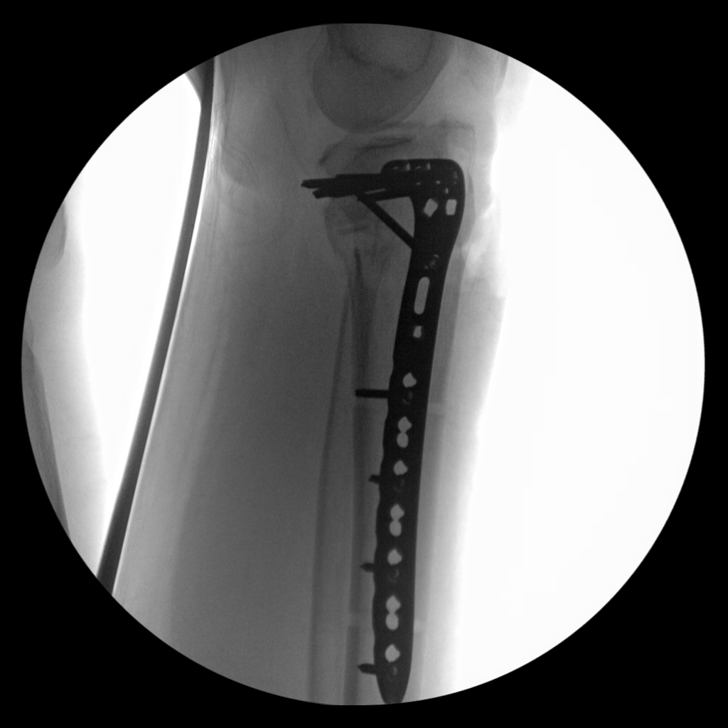
[im 9/9]
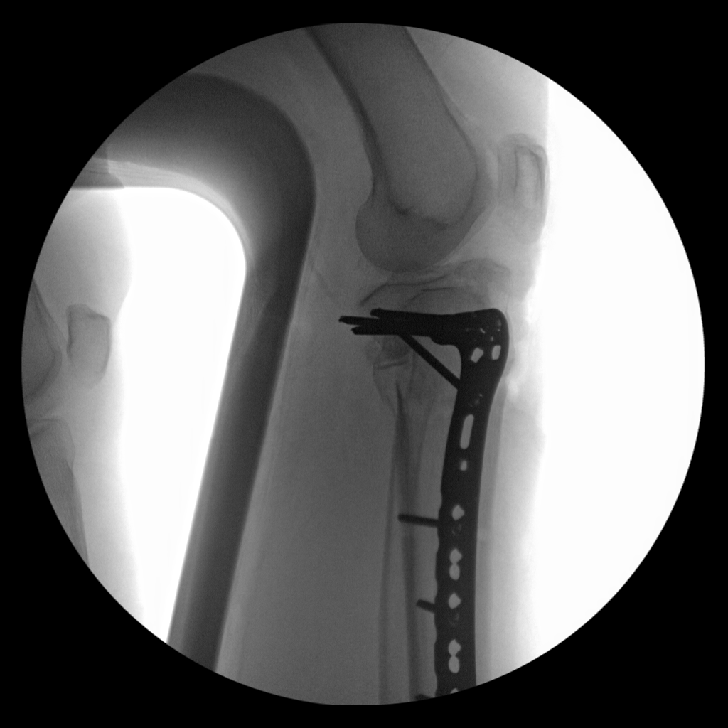

[9 of 9 positions shown; findings below may reference images not displayed]

FLUOROSCOPY TIME:  Fluoroscopy Time:  5 minutes 20 seconds

Number of Provided spot Images: 9
FINDINGS: Intraoperative fluoroscopic images of the left knee demonstrate ORIF
of the comminuted proximal tibia fracture which was previously shown
to involve both medial and lateral plateaus. A lateral fixation
plate and multiple screws have been placed, and the final images
demonstrate grossly anatomic alignment. A comminuted, mildly
displaced proximal fibular fracture is again noted.
IMPRESSION: Intraoperative images during ORIF of proximal tibia fracture.

## 2020-11-12 MED ORDER — CHLORHEXIDINE GLUCONATE 0.12 % MT SOLN: 15.0000 mL | Freq: Once | OROMUCOSAL | Status: AC

## 2020-11-12 MED ORDER — LACTATED RINGERS IV SOLN: INTRAVENOUS | Status: DC

## 2020-11-12 MED ORDER — ORAL CARE MOUTH RINSE: 15.0000 mL | Freq: Once | OROMUCOSAL | Status: AC

## 2020-11-12 MED ORDER — CEFAZOLIN SODIUM-DEXTROSE 2-4 GM/100ML-% IV SOLN
2.0000 g | INTRAVENOUS | Status: AC
  Administered 2020-11-13: 2 g via INTRAVENOUS

## 2020-11-13 ENCOUNTER — Encounter
Admission: RE | Disposition: A | Discharge status: Home / Self Care | Payer: Self-pay | Source: Home / Self Care | Attending: Surgery

## 2020-11-13 ENCOUNTER — Ambulatory Visit: Payer: Medicare Other

## 2020-11-13 ENCOUNTER — Other Ambulatory Visit: Payer: Self-pay

## 2020-11-13 ENCOUNTER — Encounter: Payer: Self-pay | Admitting: Surgery

## 2020-11-13 ENCOUNTER — Ambulatory Visit
Admission: RE | Admit: 2020-11-13 | Discharge: 2020-11-13 | Disposition: A | Discharge status: Home / Self Care | Payer: Medicare Other | Attending: Surgery | Admitting: Surgery

## 2020-11-13 ENCOUNTER — Ambulatory Visit: Payer: Medicare Other | Admitting: Urgent Care

## 2020-11-13 DIAGNOSIS — M25811 Other specified joint disorders, right shoulder: Secondary | ICD-10-CM | POA: Diagnosis present

## 2020-11-13 DIAGNOSIS — S43431A Superior glenoid labrum lesion of right shoulder, initial encounter: Secondary | ICD-10-CM | POA: Diagnosis not present

## 2020-11-13 DIAGNOSIS — W19XXXA Unspecified fall, initial encounter: Secondary | ICD-10-CM | POA: Diagnosis not present

## 2020-11-13 DIAGNOSIS — M75121 Complete rotator cuff tear or rupture of right shoulder, not specified as traumatic: Secondary | ICD-10-CM | POA: Diagnosis not present

## 2020-11-13 DIAGNOSIS — Z79899 Other long term (current) drug therapy: Secondary | ICD-10-CM | POA: Insufficient documentation

## 2020-11-13 DIAGNOSIS — M25511 Pain in right shoulder: Secondary | ICD-10-CM

## 2020-11-13 HISTORY — PX: SHOULDER ARTHROSCOPY WITH SUBACROMIAL DECOMPRESSION AND OPEN ROTATOR C: SHX5688

## 2020-11-13 HISTORY — DX: Nonrheumatic mitral (valve) insufficiency: I34.0

## 2020-11-13 HISTORY — DX: Depression, unspecified: F32.A

## 2020-11-13 SURGERY — SHOULDER ARTHROSCOPY WITH SUBACROMIAL DECOMPRESSION AND OPEN ROTATOR CUFF REPAIR, OPEN BICEPS TENDON REPAIR
Anesthesia: General | Site: Shoulder | Laterality: Right

## 2020-11-13 MED ORDER — METOCLOPRAMIDE HCL 10 MG PO TABS: 5.0000 mg | ORAL_TABLET | Freq: Three times a day (TID) | ORAL | Status: DC | PRN

## 2020-11-13 MED ORDER — ONDANSETRON HCL 4 MG/2ML IJ SOLN: 4.0000 mg | Freq: Four times a day (QID) | INTRAMUSCULAR | Status: DC | PRN

## 2020-11-13 MED ORDER — ONDANSETRON HCL 4 MG/2ML IJ SOLN
INTRAMUSCULAR | Status: DC | PRN
  Administered 2020-11-13: 4 mg via INTRAVENOUS

## 2020-11-13 MED ORDER — PROPOFOL 10 MG/ML IV BOLUS
INTRAVENOUS | Status: AC
  Filled 2020-11-13: qty 20

## 2020-11-13 MED ORDER — SODIUM CHLORIDE 0.9 % IV SOLN
INTRAVENOUS | Status: DC | PRN
  Administered 2020-11-13: 40 ug/min via INTRAVENOUS

## 2020-11-13 MED ORDER — FENTANYL CITRATE (PF) 100 MCG/2ML IJ SOLN
INTRAMUSCULAR | Status: AC
  Filled 2020-11-13: qty 2

## 2020-11-13 MED ORDER — ONDANSETRON HCL 4 MG/2ML IJ SOLN
INTRAMUSCULAR | Status: AC
  Filled 2020-11-13: qty 2

## 2020-11-13 MED ORDER — MIDAZOLAM HCL 2 MG/2ML IJ SOLN: 1.0000 mg | Freq: Once | INTRAMUSCULAR | Status: DC

## 2020-11-13 MED ORDER — SODIUM CHLORIDE 0.9 % IV SOLN: INTRAVENOUS | Status: DC

## 2020-11-13 MED ORDER — LIDOCAINE HCL (PF) 2 % IJ SOLN
INTRAMUSCULAR | Status: AC
  Filled 2020-11-13: qty 5

## 2020-11-13 MED ORDER — DEXAMETHASONE SODIUM PHOSPHATE 10 MG/ML IJ SOLN
INTRAMUSCULAR | Status: AC
  Filled 2020-11-13: qty 1

## 2020-11-13 MED ORDER — BUPIVACAINE HCL (PF) 0.5 % IJ SOLN
INTRAMUSCULAR | Status: DC | PRN
  Administered 2020-11-13: 10 mL

## 2020-11-13 MED ORDER — BUPIVACAINE HCL (PF) 0.5 % IJ SOLN
INTRAMUSCULAR | Status: AC
  Filled 2020-11-13: qty 10

## 2020-11-13 MED ORDER — OXYCODONE HCL 5 MG PO TABS: 5.0000 mg | ORAL_TABLET | ORAL | 0 refills | Status: DC | PRN

## 2020-11-13 MED ORDER — BUPIVACAINE-EPINEPHRINE 0.5% -1:200000 IJ SOLN
INTRAMUSCULAR | Status: DC | PRN
  Administered 2020-11-13: 30 mL

## 2020-11-13 MED ORDER — EPINEPHRINE PF 1 MG/ML IJ SOLN
INTRAMUSCULAR | Status: AC
  Filled 2020-11-13: qty 1

## 2020-11-13 MED ORDER — MIDAZOLAM HCL 2 MG/2ML IJ SOLN
INTRAMUSCULAR | Status: AC
  Filled 2020-11-13: qty 2

## 2020-11-13 MED ORDER — LIDOCAINE HCL (CARDIAC) PF 100 MG/5ML IV SOSY
PREFILLED_SYRINGE | INTRAVENOUS | Status: DC | PRN
  Administered 2020-11-13: 60 mg via INTRAVENOUS

## 2020-11-13 MED ORDER — CEFAZOLIN SODIUM-DEXTROSE 2-4 GM/100ML-% IV SOLN
INTRAVENOUS | Status: AC
  Filled 2020-11-13: qty 100

## 2020-11-13 MED ORDER — BUPIVACAINE LIPOSOME 1.3 % IJ SUSP
INTRAMUSCULAR | Status: DC | PRN
  Administered 2020-11-13: 20 mL

## 2020-11-13 MED ORDER — BUPIVACAINE-EPINEPHRINE (PF) 0.5% -1:200000 IJ SOLN
INTRAMUSCULAR | Status: AC
  Filled 2020-11-13: qty 30

## 2020-11-13 MED ORDER — FENTANYL CITRATE (PF) 100 MCG/2ML IJ SOLN: 50.0000 ug | Freq: Once | INTRAMUSCULAR | Status: DC

## 2020-11-13 MED ORDER — OXYCODONE HCL 5 MG PO TABS: 5.0000 mg | ORAL_TABLET | ORAL | Status: DC | PRN

## 2020-11-13 MED ORDER — EPHEDRINE SULFATE 50 MG/ML IJ SOLN
INTRAMUSCULAR | Status: DC | PRN
  Administered 2020-11-13: 10 mg via INTRAVENOUS

## 2020-11-13 MED ORDER — ROCURONIUM BROMIDE 100 MG/10ML IV SOLN
INTRAVENOUS | Status: DC | PRN
  Administered 2020-11-13: 20 mg via INTRAVENOUS
  Administered 2020-11-13: 40 mg via INTRAVENOUS

## 2020-11-13 MED ORDER — CHLORHEXIDINE GLUCONATE 0.12 % MT SOLN
OROMUCOSAL | Status: AC
  Administered 2020-11-13: 15 mL via OROMUCOSAL
  Filled 2020-11-13: qty 15

## 2020-11-13 MED ORDER — LACTATED RINGERS IV SOLN
INTRAVENOUS | Status: DC | PRN
  Administered 2020-11-13: 3001 mL

## 2020-11-13 MED ORDER — PHENYLEPHRINE HCL (PRESSORS) 10 MG/ML IV SOLN
INTRAVENOUS | Status: DC | PRN
  Administered 2020-11-13: 100 ug via INTRAVENOUS
  Administered 2020-11-13 (×2): 200 ug via INTRAVENOUS
  Administered 2020-11-13: 100 ug via INTRAVENOUS

## 2020-11-13 MED ORDER — DEXAMETHASONE SODIUM PHOSPHATE 10 MG/ML IJ SOLN
INTRAMUSCULAR | Status: DC | PRN
  Administered 2020-11-13: 10 mg via INTRAVENOUS

## 2020-11-13 MED ORDER — ONDANSETRON HCL 4 MG PO TABS: 4.0000 mg | ORAL_TABLET | Freq: Four times a day (QID) | ORAL | Status: DC | PRN

## 2020-11-13 MED ORDER — PROPOFOL 10 MG/ML IV BOLUS
INTRAVENOUS | Status: DC | PRN
  Administered 2020-11-13: 80 mg via INTRAVENOUS

## 2020-11-13 MED ORDER — BUPIVACAINE LIPOSOME 1.3 % IJ SUSP
INTRAMUSCULAR | Status: AC
  Filled 2020-11-13: qty 20

## 2020-11-13 MED ORDER — EPINEPHRINE PF 1 MG/ML IJ SOLN
INTRAMUSCULAR | Status: AC
  Filled 2020-11-13: qty 4

## 2020-11-13 MED ORDER — LIDOCAINE HCL (PF) 1 % IJ SOLN
INTRAMUSCULAR | Status: AC
  Filled 2020-11-13: qty 5

## 2020-11-13 MED ORDER — METOCLOPRAMIDE HCL 5 MG/ML IJ SOLN
5.0000 mg | Freq: Three times a day (TID) | INTRAMUSCULAR | Status: DC | PRN
Start: 2020-11-13 — End: 2020-11-13

## 2020-11-13 MED ORDER — ACETAMINOPHEN 10 MG/ML IV SOLN
INTRAVENOUS | Status: DC | PRN
  Administered 2020-11-13: 1000 mg via INTRAVENOUS

## 2020-11-13 SURGICAL SUPPLY — 59 items
ANCH SUT 2 2.9 2 LD TPR NDL (Anchor) ×1 IMPLANT
ANCH SUT 5.5 KNTLS (Anchor) ×1 IMPLANT
ANCH SUT BN ASCP DLV (Anchor) ×1 IMPLANT
ANCH SUT Q-FX 2.8 (Anchor) ×1 IMPLANT
ANCH SUT RGNRT REGENETEN (Staple) ×1 IMPLANT
ANCHOR ALL-SUT Q-FIX 2.8 (Anchor) ×1 IMPLANT
ANCHOR BONE REGENETEN (Anchor) ×2 IMPLANT
ANCHOR HEALICOIL REGEN 5.5 (Anchor) ×2 IMPLANT
ANCHOR JUGGERKNOT WTAP NDL 2.9 (Anchor) ×1 IMPLANT
ANCHOR TENDON REGENETEN (Staple) ×2 IMPLANT
APL PRP STRL LF DISP 70% ISPRP (MISCELLANEOUS) ×1
BIT DRILL JUGRKNT W/NDL BIT2.9 (DRILL) ×1 IMPLANT
BLADE FULL RADIUS 3.5 (BLADE) ×2 IMPLANT
BUR ACROMIONIZER 4.0 (BURR) ×2 IMPLANT
CANNULA SHAVER 8MMX76MM (CANNULA) ×2 IMPLANT
CHLORAPREP W/TINT 26 (MISCELLANEOUS) ×2 IMPLANT
COVER MAYO STAND REUSABLE (DRAPES) ×2 IMPLANT
COVER WAND RF STERILE (DRAPES) ×2 IMPLANT
DILATOR 5.5 THREADED HEALICOIL (MISCELLANEOUS) ×2 IMPLANT
DRAPE IMP U-DRAPE 54X76 (DRAPES) ×4 IMPLANT
DRILL JUGGERKNOT W/NDL BIT 2.9 (DRILL) ×2
ELECT CAUTERY BLADE 6.4 (BLADE) ×2 IMPLANT
ELECT REM PT RETURN 9FT ADLT (ELECTROSURGICAL) ×2
ELECTRODE REM PT RTRN 9FT ADLT (ELECTROSURGICAL) ×1 IMPLANT
GAUZE SPONGE 4X4 12PLY STRL (GAUZE/BANDAGES/DRESSINGS) ×2 IMPLANT
GAUZE XEROFORM 1X8 LF (GAUZE/BANDAGES/DRESSINGS) ×2 IMPLANT
GLOVE SRG 8 PF TXTR STRL LF DI (GLOVE) ×1 IMPLANT
GLOVE SURG ENC MOIS LTX SZ7.5 (GLOVE) ×4 IMPLANT
GLOVE SURG ENC MOIS LTX SZ8 (GLOVE) ×4 IMPLANT
GLOVE SURG UNDER LTX SZ8 (GLOVE) ×2 IMPLANT
GLOVE SURG UNDER POLY LF SZ8 (GLOVE) ×2
GOWN STRL REUS W/ TWL LRG LVL3 (GOWN DISPOSABLE) ×1 IMPLANT
GOWN STRL REUS W/ TWL XL LVL3 (GOWN DISPOSABLE) ×1 IMPLANT
GOWN STRL REUS W/TWL LRG LVL3 (GOWN DISPOSABLE) ×2
GOWN STRL REUS W/TWL XL LVL3 (GOWN DISPOSABLE) ×2
GRASPER SUT 15 45D LOW PRO (SUTURE) IMPLANT
IMPL REGENETEN MEDIUM (Shoulder) IMPLANT
IMPLANT REGENETEN MEDIUM (Shoulder) ×2 IMPLANT
IV LACTATED RINGER IRRG 3000ML (IV SOLUTION) ×4
IV LR IRRIG 3000ML ARTHROMATIC (IV SOLUTION) ×2 IMPLANT
KIT CANNULA 8X76-LX IN CANNULA (CANNULA) IMPLANT
KIT SUTURE 2.8 Q-FIX DISP (MISCELLANEOUS) ×2 IMPLANT
MANIFOLD NEPTUNE II (INSTRUMENTS) ×4 IMPLANT
MASK FACE SPIDER DISP (MASK) ×2 IMPLANT
MAT ABSORB  FLUID 56X50 GRAY (MISCELLANEOUS) ×2
MAT ABSORB FLUID 56X50 GRAY (MISCELLANEOUS) ×1 IMPLANT
PACK ARTHROSCOPY SHOULDER (MISCELLANEOUS) ×2 IMPLANT
PASSER SUT FIRSTPASS SELF (INSTRUMENTS) ×1 IMPLANT
SLING ARM LRG DEEP (SOFTGOODS) ×2 IMPLANT
SLING ULTRA II LG (MISCELLANEOUS) ×2 IMPLANT
STAPLER SKIN PROX 35W (STAPLE) ×2 IMPLANT
STRAP SAFETY 5IN WIDE (MISCELLANEOUS) ×2 IMPLANT
SUT ETHIBOND 0 MO6 C/R (SUTURE) ×2 IMPLANT
SUT ULTRABRAID 2 COBRAID 38 (SUTURE) IMPLANT
SUT VIC AB 2-0 CT1 27 (SUTURE) ×4
SUT VIC AB 2-0 CT1 TAPERPNT 27 (SUTURE) ×2 IMPLANT
TAPE MICROFOAM 4IN (TAPE) ×2 IMPLANT
TUBING ARTHRO INFLOW-ONLY STRL (TUBING) ×2 IMPLANT
WAND WEREWOLF FLOW 90D (MISCELLANEOUS) ×2 IMPLANT

## 2020-11-13 NOTE — Anesthesia Procedure Notes (Signed)
Procedure Name: Intubation Date/Time: 11/13/2020 1:57 PM Performed by: Debe Coder, CRNA Pre-anesthesia Checklist: Patient identified, Emergency Drugs available, Suction available and Patient being monitored Patient Re-evaluated:Patient Re-evaluated prior to induction Oxygen Delivery Method: Circle system utilized Preoxygenation: Pre-oxygenation with 100% oxygen Induction Type: IV induction Ventilation: Mask ventilation without difficulty Grade View: Grade I Tube type: Oral Tube size: 6.5 mm Number of attempts: 1 Airway Equipment and Method: Oral airway (Anterior pressure) Placement Confirmation: ETT inserted through vocal cords under direct vision,  positive ETCO2 and breath sounds checked- equal and bilateral Secured at: 19.5 cm Tube secured with: Tape Dental Injury: Teeth and Oropharynx as per pre-operative assessment

## 2020-11-13 NOTE — Anesthesia Postprocedure Evaluation (Signed)
Anesthesia Post Note  Patient: Cindy Mcclure  Procedure(s) Performed: SHOULDER ARTHROSCOPY WITH DEBRIDEMENT, DECOMPRESSION, POSSIBLE ROTATOR CUFF REPAIR, AND POSSIBLE  BICEP TENODESIS. (Right Shoulder)  Patient location during evaluation: PACU Anesthesia Type: General Level of consciousness: awake and alert Pain management: pain level controlled Vital Signs Assessment: post-procedure vital signs reviewed and stable Respiratory status: spontaneous breathing and respiratory function stable Cardiovascular status: stable Anesthetic complications: no   No complications documented.   Last Vitals:  Vitals:   11/13/20 1630 11/13/20 1655  BP: 101/60 113/67  Pulse: 79 87  Resp: 18 16  Temp: (!) 36.2 C 36.6 C  SpO2: 93% 93%    Last Pain:  Vitals:   11/13/20 1655  TempSrc: Temporal  PainSc: 0-No pain                 Shihab States K

## 2020-11-13 NOTE — Discharge Instructions (Addendum)
Orthopedic discharge instructions: Keep dressing dry and intact.  May shower after dressing changed on post-op day #4 (Saturday).  Cover staples with Band-Aids after drying off. Apply ice frequently to shoulder. Take Diclofenac 75 mg BID OR ibuprofen 600-800 mg TID with meals for 7-10 days, then as necessary. Take oxycodone as prescribed when needed.  May supplement with ES Tylenol if necessary. Keep shoulder immobilizer on at all times except may remove for bathing purposes. Follow-up in 10-14 days or as scheduled.   AMBULATORY SURGERY  DISCHARGE INSTRUCTIONS   1) The drugs that you were given will stay in your system until tomorrow so for the next 24 hours you should not:  A) Drive an automobile B) Make any legal decisions C) Drink any alcoholic beverage   2) You may resume regular meals tomorrow.  Today it is better to start with liquids and gradually work up to solid foods.  You may eat anything you prefer, but it is better to start with liquids, then soup and crackers, and gradually work up to solid foods.   3) Please notify your doctor immediately if you have any unusual bleeding, trouble breathing, redness and pain at the surgery site, drainage, fever, or pain not relieved by medication.  4) Your post-operative visit with Dr.                                     is: Date:                        Time:    Please call to schedule your post-operative visit.  5) Additional Instructions:

## 2020-11-13 NOTE — Anesthesia Procedure Notes (Signed)
Anesthesia Regional Block: Interscalene brachial plexus block   Pre-Anesthetic Checklist: ,, timeout performed, Correct Patient, Correct Site, Correct Laterality, Correct Procedure, Correct Position, site marked, Risks and benefits discussed,  Surgical consent,  Pre-op evaluation,  At surgeon's request and post-op pain management  Laterality: Right  Prep: chloraprep, alcohol swabs       Needles:  Injection technique: Single-shot  Needle Type: Stimiplex     Needle Length: 5cm  Needle Gauge: 22     Additional Needles:   Procedures:, nerve stimulator,,, ultrasound used (permanent image in chart),,,,   Nerve Stimulator or Paresthesia:  Response: biceps flexion, 0.48 mA,   Additional Responses:   Narrative:  Start time: 11/13/2020 1:27 PM End time: 11/13/2020 1:29 PM Injection made incrementally with aspirations every 5 mL.  Performed by: Personally  Anesthesiologist: Alvin Critchley, MD  Additional Notes: Functioning IV was confirmed and monitors were applied.  A 70mm 22ga Stimuplex needle was used. Sterile prep and drape,hand hygiene and sterile gloves were used.  Negative aspiration and negative test dose prior to incremental administration of local anesthetic. The patient tolerated the procedure well.  No pain on incremental injection and good spread.

## 2020-11-13 NOTE — H&P (Signed)
History of Present Illness: Cindy Mcclure is a 55 y.o. female who presents today for her surgical history and physical for upcoming right shoulder arthroscopy with debridement, decompression, possible rotator cuff repair and possible biceps tenodesis. Surgery scheduled with Dr. Roland Rack on 11/13/2020. The patient denies any changes in her medical history since she was last evaluated. She denies any falls or trauma since she was last evaluated in the office. She does report that she is finding it more difficult to lift her right arm and perform activities above her head. The patient denies any personal history of heart attack, stroke, asthma or COPD. No personal history of blood clots. The patient was given a slingshot shoulder sling to wear following surgery at her last appointment.  Past Medical History: . Allergic state  . Arthritis  . Chronic back pain  . Seasonal allergies  . Seizures (CMS-HCC)   Past Surgical History: . CESAREAN SECTION x2  . CHOLECYSTECTOMY  . COLONOSCOPY 09/29/2016 (Int Hemorrhoids: CBF 09/2026)  . Knee arthroscopy with excision plica, partial synovectomy Left 11/27/2016 (Dr. Rudene Christians)  . knee arthroscopy with medial menisectomy, partia synovectomy Right 09/03/2017 (Dr. Rudene Christians)  . Limited arthroscopic debridement arthroscopic repair of partial thickness subscapularis tendon tear arthroscopic subacromial decompression mini-open rotator cuff repair with excision of benign-appearing soft tissue mass of rotator interval Left 03/22/2019 (Dr. Roland Rack)  . Surgery for seizures 1985   Past Family History: . Osteoporosis (Thinning of bones) Mother  . Kidney disease Father  . Osteoporosis (Thinning of bones) Paternal Grandmother   Medications: . ascorbic acid, vitamin C, (VITAMIN C) 500 MG tablet Take 500 mg by mouth once daily  . calcium carbonate/vitamin D3 (CALCIUM 600 + D,3, ORAL) Take 1 tablet by mouth once daily  . cloBAZam (ONFI) 10 mg tablet Take 3 tablets (30 mg total) by mouth  nightly 270 tablet 1  . cyanocobalamin, vitamin B-12, (VITAMIN B-12 ORAL) Take 500 mg by mouth once daily  . diclofenac (VOLTAREN) 75 MG EC tablet TAKE 1 TABLET BY MOUTH TWICE DAILY WITH MEALS 60 tablet 5  . FOLIC ACID/MV,FE,MIN (CENTRUM ORAL) Take 1 tablet by mouth once daily  . lamoTRIgine (LAMICTAL) 150 MG tablet Take 1 tablet (150 mg total) by mouth 2 (two) times daily 180 tablet 1  . levETIRAcetam (KEPPRA XR) 750 mg ER tablet TAKE 1 TABLET BY MOUTH TWICE DAILY 180 tablet 1  . omeprazole (PRILOSEC) 20 MG DR capsule Take 1 capsule (20 mg total) by mouth once daily 90 capsule 3  . pyridoxine, vitamin B6, (VITAMIN B-6) 100 MG tablet Take 100 mg by mouth once daily  . traMADoL (ULTRAM) 50 mg tablet Take 1 tablet (50 mg total) by mouth once daily as needed 30 tablet 5  . vitamin A palmitate 10,000 unit Tab Take by mouth Take 10,000 Units by mouth daily.  . vitamin E acid succinate (VITAMIN E SUCCINATE) 200 unit Tab Take by mouth Take 180 Units by mouth daily.  Marland Kitchen zonisamide (ZONEGRAN) 100 MG capsule Take 4 capsules (400 mg total) by mouth nightly 360 capsule 3  . diclofenac (VOLTAREN) 1 % topical gel Apply 2 g topically as needed (Patient not taking: Reported on 11/07/2020 )   Allergies: No Known Allergies   Review of Systems:  A comprehensive 14 point ROS was performed, reviewed by me today, and the pertinent orthopaedic findings are documented in the HPI.  Physical Exam: BP 110/62 (BP Location: Left upper arm, Patient Position: Sitting, BP Cuff Size: Large Adult)  Ht 149.9 cm (4'  11")  Wt 81.5 kg (179 lb 9.6 oz)  BMI 36.27 kg/m  General/Constitutional: The patient appears to be well-nourished, well-developed, and in no acute distress. Neuro/Psych: Normal mood and affect, oriented to person, place and time. Eyes: Non-icteric. Pupils are equal, round, and reactive to light, and exhibit synchronous movement. ENT: Unremarkable. Lymphatic: No palpable adenopathy. Respiratory: Lungs clear to  auscultation, Normal chest excursion, No wheezes and Non-labored breathing Cardiovascular: Regular rate and rhythm. No murmurs. and No edema, swelling or tenderness, except as noted in detailed exam. Integumentary: No impressive skin lesions present, except as noted in detailed exam. Musculoskeletal: Unremarkable, except as noted in detailed exam.  General: Well developed, well nourished 55 y.o. female in no apparent distress. Normal affect. Normal communication. Patient answers questions appropriately. The patient has a normal gait. There is no antalgic component. There is no hip lurch.   Right Upper Extremity: Examination of the right shoulder and arm showed no bony abnormality or edema. The patient is able to achieve forward flexion of 110 degrees of moderate pain, abduction 95 degrees of moderate pain. With the right arm abducted 90 degrees, she can tolerate external rotation to 80 degrees, internal rotation 60 degrees. Full passive range of motion to the right shoulder. The patient has a positive Hawkins test and a positive impingement test. The patient has a positive drop arm test. The patient is tender palpation to the right subacromial space and to the proximal bicep tendon. The patient has no instability of the shoulder with anterior-posterior motion. There is a negative sulcus sign. The rotator cuff muscle strength is 3-4/5 with supraspinatus, 4/5 with internal rotation, and 4/5 with external rotation. There is no crepitus with range of motion activities.   Neurological: The patient has sensation that is intact to light touch and pinprick bilaterally. The patient has normal grip strength. The patient has full biceps, wrist extension, grip, and interosseous strength. The patient has 2 + DTRs bilaterally.  Vascular: The patient has less than 2 second capillary refill. The patient has normal ulnar and radial pulses. The patient has normal warmth to touch.   Imaging: True AP, Y-scapular, and  axillary views of the right shoulder are obtained at her previous visit. These films demonstrate no evidence for fractures, lytic lesions, or significant degenerative changes. The subacromial space is well-maintained. There is no subacromial or infra-clavicular spurring. She demonstrates a Type I acromion.  MRI OF THE RIGHT SHOULDER WITHOUT CONTRAST:  1. Severe tendinosis of the supraspinatus tendon with a  partial-thickness bursal surface tear.  2. Mild tendinosis of the infraspinatustendon.   Impression: 1. Traumatic incomplete tear of right rotator cuff. 2. Right rotator cuff tendonitis. 3. Biceps tendinitis of right upper extremity.  Plan:  1. Treatment options were discussed today with the patient. 2. The patient is scheduled for right shoulder arthroscopy with debridement, decompression, possible rotator cuff repair and possible biceps tenodesis. 3. Surgery scheduled with Dr. Roland Rack on 11/13/2020. 4. The patient was instructed on the risk and benefits of surgery and wishes to proceed at this time. 5. This document will serve as a surgical history and physical. 6. The patient will follow-up per standard postop protocol. They can call the clinic they have any questions, new symptoms develop or symptoms worsen.  The procedure was discussed with the patient, as were the potential risks (including bleeding, infection, nerve and/or blood vessel injury, persistent or recurrent pain, failure of the repair, progression of arthritis, need for further surgery, blood clots, strokes, heart attacks and/or  arhythmias, pneumonia, etc.) and benefits. The patient states her understanding and wishes to proceed.   H&P reviewed and patient re-examined. No changes.

## 2020-11-13 NOTE — Anesthesia Preprocedure Evaluation (Signed)
Anesthesia Evaluation  Patient identified by MRN, date of birth, ID band Patient awake    Reviewed: Allergy & Precautions, H&P , NPO status , Patient's Chart, lab work & pertinent test results  History of Anesthesia Complications Negative for: history of anesthetic complications  Airway Mallampati: III  TM Distance: >3 FB Neck ROM: full    Dental  (+) Chipped, Poor Dentition   Pulmonary neg shortness of breath, former smoker,           Cardiovascular + angina + CAD and + DOE       Neuro/Psych Seizures -, Well Controlled,  PSYCHIATRIC DISORDERS Anxiety Depression  Neuromuscular disease negative psych ROS   GI/Hepatic Neg liver ROS, GERD  Medicated and Controlled,  Endo/Other  negative endocrine ROS  Renal/GU      Musculoskeletal  (+) Arthritis ,   Abdominal   Peds  Hematology negative hematology ROS (+)   Anesthesia Other Findings Past Medical History: No date: Allergic rhinitis No date: Anginal pain [facility])     Comment:  sees dr Clayborn Bigness No date: Anxiety No date: Arthritis     Comment:  all over...knees, ankles, back No date: Back pain No date: Cervical radiculitis     Comment:  no surgery, just cortisone shots.   No date: Constipation No date: Constipation No date: DDD (degenerative disc disease), cervical 2019: Dermatophytosis of nail     Comment:  both feet No date: GERD (gastroesophageal reflux disease) No date: History of kidney stones     Comment:  h/o No date: Osteoarthritis No date: Panic attacks No date: Plantar fasciitis     Comment:  bilaterally 1972: Seizures ([place])     Comment:  unsure of when she had last seizure due to petit mal               type. does not know what brings on a seizure. No date: Stress bladder incontinence, female No date: Urinary incontinence  Past Surgical History: 2002,1994: CESAREAN SECTION 12/06/2015: CHOLECYSTECTOMY; N/A     Comment:  Procedure: LAPAROSCOPIC  CHOLECYSTECTOMY WITH               INTRAOPERATIVE CHOLANGIOGRAM;  Surgeon: Dia Crawford III,               MD;  Location: [facility] ORS;  Service: General;  Laterality:               N/A; 09/29/2016: COLONOSCOPY WITH PROPOFOL; N/A     Comment:  Procedure: COLONOSCOPY WITH PROPOFOL;  Surgeon: Manya Silvas, MD;  Location: [facility] ENDOSCOPY;  Service:               Endoscopy;  Laterality: N/A; 11/27/2016: KNEE ARTHROSCOPY WITH EXCISION PLICA; Left     Comment:  Procedure: KNEE ARTHROSCOPY WITH EXCISION PLICA, PARTIAL              SYNOVECTOMY;  Surgeon: Hessie Knows, MD;  Location: [facility]               ORS;  Service: Orthopedics;  Laterality: Left; 09/03/2017: KNEE ARTHROSCOPY WITH MEDIAL MENISECTOMY; Right     Comment:  Procedure: KNEE ARTHROSCOPY WITH MEDIAL MENISECTOMY,               PARTIAL SYNOVECTOMY;  Surgeon: Hessie Knows, MD;                Location: [facility] ORS;  Service: Orthopedics;  Laterality:  Right; No date: SURGERY FOR SEIZURES     Comment:  nothing implanted in head. she was 55 years old. done at              [facility] 2003: TUBAL LIGATION  BMI    Body Mass Index: 32.37 kg/m      Reproductive/Obstetrics negative OB ROS                             Anesthesia Physical  Anesthesia Plan  ASA: IV  Anesthesia Plan: General ETT   Post-op Pain Management:    Induction: Intravenous  PONV Risk Score and Plan: Ondansetron, Dexamethasone, Midazolam and Treatment may vary due to age or medical condition  Airway Management Planned: Oral ETT  Additional Equipment:   Intra-op Plan:   Post-operative Plan: Extubation in OR  Informed Consent: I have reviewed the patients History and Physical, chart, labs and discussed the procedure including the risks, benefits and alternatives for the proposed anesthesia with the patient or authorized representative who has indicated his/her understanding and acceptance.     Dental Advisory  Given  Plan Discussed with: Anesthesiologist, CRNA and Surgeon  Anesthesia Plan Comments: (Patient has cardiac clearance for this procedure.   Patient informed that they are higher risk for complications from anesthesia during this procedure due to their medical history.  Patient voiced understanding.  Patient consented for risks of anesthesia including but not limited to:  - adverse reactions to medications - damage to teeth, lips or other oral mucosa - sore throat or hoarseness - Damage to heart, brain, lungs or loss of life  Patient voiced understanding.)        Anesthesia Quick Evaluation

## 2020-11-13 NOTE — Op Note (Signed)
11/13/2020  3:22 PM  Patient:   Cindy Mcclure  Pre-Op Diagnosis:   Impingement/tendinopathy with traumatic partial thickness rotator cuff tear and biceps tendinopathy, right shoulder.  Post-Op Diagnosis:   Impingement/tendinopathy with traumatic full-thickness rotator cuff tear, Type I SLAP tear, and biceps tendinopathy, right shoulder.  Procedure:   Limited arthroscopic debridement, arthroscopic subacromial decompression, mini-open rotator cuff repair supplemented with a Smith & Nephew Regeneten patch, and mini-open biceps tenodesis, right shoulder.  Anesthesia:   General endotracheal with interscalene block using Exparel placed preoperatively by the anesthesiologist.  Surgeon:   Pascal Lux, MD  Assistant:   Cameron Proud, PA-C  Findings:   As above.  The labral tear extending from the 11:00 to the 1:30 position but did not involve an actual detachment from the glenoid rim. There was extensive articular sided partial-thickness tearing of the anterior middle thirds of the supraspinatus insertional fibers involving approximately 70% of the footprint with a small full-thickness component. The remainder the rotator cuff was in satisfactory condition. There was moderate tendinopathy of the biceps tendon without partial or full-thickness tearing. The articular surfaces of the glenoid and humerus both were in satisfactory condition.  Complications:   None  Fluids:   700 cc  Estimated blood loss:   5 cc  Tourniquet time:   None  Drains:   None  Closure:   Staples      Brief clinical note:   The patient is a 55 year old female with a 9 to 85-month history of right shoulder pain following a fall in May, 2021. The patient's symptoms have progressed despite medications, activity modification, etc. The patient's history and examination are consistent with impingement/tendinopathy with a rotator cuff tear. A preoperative MRI scan demonstrated evidence of a partial-thickness rotator cuff  tear as well as biceps tendinopathy. The patient presents at this time for definitive management of her shoulder symptoms.  Procedure:   The patient underwent placement of an interscalene block using Exparel by the anesthesiologist in the preoperative holding area before being brought into the operating room and lain in the supine position. The patient then underwent general endotracheal intubation and anesthesia before being repositioned in the beach chair position using the beach chair positioner. The right shoulder and upper extremity were prepped with ChloraPrep solution before being draped sterilely. Preoperative antibiotics were administered. A timeout was performed to confirm the proper surgical site before the expected portal sites and incision site were injected with 0.5% Sensorcaine with epinephrine.   A posterior portal was created and the glenohumeral joint thoroughly inspected with the findings as described above. An anterior portal was created using an outside-in technique. The labrum and rotator cuff were further probed, again confirming the above-noted findings. The areas of labral fraying were debrided back to stable margins using the full-radius resector. Subsequent probing of the remaining rim demonstrated excellent stability.  The torn portion of the supraspinatus insertional fibers were debrided back to stable margins using the full-radius resector, as were areas of synovitis. The ArthroCare wand was inserted and used to release the biceps tendon from its labral anchor. It also was used to obtain hemostasis as well as to "anneal" the labrum superiorly and anteriorly. The instruments were removed from the joint after suctioning the excess fluid.  The camera was repositioned through the posterior portal into the subacromial space. A separate lateral portal was created using an outside-in technique. The 3.5 mm full-radius resector was introduced and used to perform a subtotal bursectomy. The  ArthroCare wand was  then inserted and used to remove the periosteal tissue off the undersurface of the anterior third of the acromion as well as to recess the coracoacromial ligament from its attachment along the anterior and lateral margins of the acromion. The 4.0 mm acromionizing bur was introduced and used to complete the decompression by removing the undersurface of the anterior third of the acromion. The full radius resector was reintroduced to remove any residual bony debris before the ArthroCare wand was reintroduced to obtain hemostasis. The instruments were then removed from the subacromial space after suctioning the excess fluid.  An approximately 4-5 cm incision was made over the anterolateral aspect of the shoulder beginning at the anterolateral corner of the acromion and extending distally in line with the bicipital groove. This incision was carried down through the subcutaneous tissues to expose the deltoid fascia. The raphae between the anterior and middle thirds was identified and this plane developed to provide access into the subacromial space. Additional bursal tissues were debrided sharply using Metzenbaum scissors. The rotator cuff tear was readily identified and completed using a #15 blade. The margins were debrided sharply with a #15 blade and the exposed greater tuberosity roughened with a rongeur. The tear was repaired using a single Smith & Nephew 2.8 mm Q-Fix anchor. These sutures were then brought back laterally and secured using a single [place] knotless RegenaSorb anchor to create a two-layer closure the repaired tissues were then covered with a medium sized [place] patch and secured using the appropriate bone and soft tissue staples. An apparent watertight closure was obtained.  The bicipital groove was identified by palpation and opened for 1-1.5 cm. The biceps tendon stump was retrieved through this defect. The floor of the bicipital groove was  roughened with a curet before another Biomet 2.9 mm JuggerKnot anchor was inserted. Both sets of sutures were passed through the biceps tendon and tied securely to effect the tenodesis. The bicipital sheath was reapproximated using two #0 Ethibond interrupted sutures, incorporating the biceps tendon to further reinforce the tenodesis.  The wound was copiously irrigated with sterile saline solution before the deltoid raphae was reapproximated using 2-0 Vicryl interrupted sutures. The subcutaneous tissues were closed in two layers using 2-0 Vicryl interrupted sutures before the skin was closed using staples. The portal sites also were closed using staples. A sterile bulky dressing was applied to the shoulder before the arm was placed into a shoulder immobilizer. The patient was then awakened, extubated, and returned to the recovery room in satisfactory condition after tolerating the procedure well.

## 2020-11-13 NOTE — Transfer of Care (Signed)
Immediate Anesthesia Transfer of Care Note  Patient: Cindy Mcclure  Procedure(s) Performed: Procedure(s): SHOULDER ARTHROSCOPY WITH DEBRIDEMENT, DECOMPRESSION, POSSIBLE ROTATOR CUFF REPAIR, AND POSSIBLE  BICEP TENODESIS. (Right)  Patient Location: PACU  Anesthesia Type:General  Level of Consciousness: sedated  Airway & Oxygen Therapy: Patient Spontanous Breathing and Patient connected to face mask oxygen  Post-op Assessment: Report given to RN and Post -op Vital signs reviewed and stable  Post vital signs: Reviewed and stable  Last Vitals:  Vitals:   11/13/20 1342 11/13/20 1556  BP: 98/65 130/61  Pulse: 72 88  Resp: 16 16  Temp:  36.4 C  SpO2: 550% 158%    Complications: No apparent anesthesia complications

## 2020-11-15 ENCOUNTER — Encounter: Payer: Self-pay | Admitting: Surgery

## 2020-12-04 ENCOUNTER — Ambulatory Visit (INDEPENDENT_AMBULATORY_CARE_PROVIDER_SITE_OTHER): Payer: Medicare Other | Admitting: Licensed Clinical Social Worker

## 2020-12-04 ENCOUNTER — Other Ambulatory Visit: Payer: Self-pay

## 2020-12-04 DIAGNOSIS — F411 Generalized anxiety disorder: Secondary | ICD-10-CM | POA: Diagnosis not present

## 2020-12-04 DIAGNOSIS — F3342 Major depressive disorder, recurrent, in full remission: Secondary | ICD-10-CM

## 2020-12-04 NOTE — Progress Notes (Signed)
Virtual Visit via Video Note  I connected with Cindy Mcclure on 12/04/20 at  3:00 PM EDT by a video enabled telemedicine application and verified that I am speaking with the correct person using two identifiers.  Location: Patient: home Provider: remote office [place], [place])   I discussed the limitations of evaluation and management by telemedicine and the availability of in person appointments. The patient expressed understanding and agreed to proceed.   I discussed the assessment and treatment plan with the patient. The patient was provided an opportunity to ask questions and all were answered. The patient agreed with the plan and demonstrated an understanding of the instructions.   The patient was advised to call back or seek an in-person evaluation if the symptoms worsen or if the condition fails to improve as anticipated.  I provided 60 minutes of non-face-to-face time during this encounter.   Othmar Ringer R Bhavana Kady, [name]    THERAPIST PROGRESS NOTE  Session Time: 3-4p  Participation Level: Active  Behavioral Response: NeatAlertAnxious  Type of Therapy: Individual Therapy  Treatment Goals addressed: Anxiety  Interventions: Family Systems  Summary: Cindy Mcclure is a 55 y.o. female who presents with symptoms associated with depression and anxiety diagnosis. Pt reports that overall mood has been fluctuating depending on family-related situations and anxiety/stress levels have been fluctuating due to the same.  Allowed pt to share family-related stressors: discussed relationship with daughter, pt explored relationship with mother, and psychological impact of recent shoulder surgery.  Pt is upset about ongoing relationship with daughter--pt is upset that daughter is not pulling her weight with house chores and is not wanting to spend quality time with her mom. Pts daughter recently moved back home from [facility].   Explored pts relationship with her mother--pt  feels that her mother is trying to "mother" her daughter and pt feels that her mother and her daughter are "leaving me out" when they make decisions to go shopping and do things together.   Reviewed pts expectations re: relationships with others and allowed pt the safe space to explore pros/cons/current expectations vs reality. Reviewed with pt that she cannot change behaviors of others, only how she is reacting to their behaviors. Encouraged continued communication of feelings and bonding activities with mother and daughter.  Continued recommendations are as follows: self care behaviors, positive social engagements, focusing on overall work/home/life balance, and focusing on positive physical and emotional wellness.    Suicidal/Homicidal: No  Therapist Response: Cindy Mcclure is continuing to develop relationships within the family. Cindy Mcclure is developing a non-avoidant approach to build overall confidence in self and needs. Cindy Mcclure is able to identify a list of key conflicts that trigger fear and worry and is able to develop problem solving skills to help manage conflict in the moment. These behaviors are reflective of both personal growth and progress. Treatment to continue as indicated.  Plan: Return again in 3-4 weeks.  Diagnosis: Axis I: MDD, recurrent, in remission; GAD    Axis II: No diagnosis    Rachel Bo Kalel Harty, [name] 12/04/2020

## 2020-12-27 ENCOUNTER — Other Ambulatory Visit: Payer: Self-pay

## 2020-12-27 ENCOUNTER — Ambulatory Visit (INDEPENDENT_AMBULATORY_CARE_PROVIDER_SITE_OTHER): Payer: Medicare Other | Admitting: Licensed Clinical Social Worker

## 2020-12-27 DIAGNOSIS — F411 Generalized anxiety disorder: Secondary | ICD-10-CM | POA: Diagnosis not present

## 2020-12-27 DIAGNOSIS — F3342 Major depressive disorder, recurrent, in full remission: Secondary | ICD-10-CM

## 2020-12-27 NOTE — Progress Notes (Signed)
Virtual Visit via Video Note  I connected with Cindy Mcclure on 12/27/20 at  3:00 PM EDT by a video enabled telemedicine application and verified that I am speaking with the correct person using two identifiers.  Location: Patient: home Provider: remote office [place], [place])   I discussed the limitations of evaluation and management by telemedicine and the availability of in person appointments. The patient expressed understanding and agreed to proceed.   I discussed the assessment and treatment plan with the patient. The patient was provided an opportunity to ask questions and all were answered. The patient agreed with the plan and demonstrated an understanding of the instructions.   The patient was advised to call back or seek an in-person evaluation if the symptoms worsen or if the condition fails to improve as anticipated.  I provided  minutes of non-face-to-face time during this encounter.   Cindy Mccabe R Zadkiel Dragan, [name]    THERAPIST PROGRESS NOTE  Session Time: 3-3:30p  Participation Level: Active  Behavioral Response: NeatAlertAnxious  Type of Therapy: Individual Therapy  Treatment Goals addressed: Anxiety and Coping  Interventions: CBT and Supportive  Summary: Cindy Mcclure is a 55 y.o. female who presents with symptoms consistent with depression and anxiety. Pt reports that overall mood fluctuates depending on situation.    Allowed pt to explore and express thoughts and feelings associated with current relationship conflict/issues with daughter. Pt feels her daughter will not speak to her or interact with her at all and is not sure why. This isn't something that started recently--its been going on for a while. Pt states that she feels her daughter prefers the company of her grandmother (pts mother) and will spend a lot of time doing activities with the grandmother.   Pt states that she has tried in the past to talk with her daughter about it, but her daughter will  not engage in conversation about it. Discussed several options to initiate communication with daughter.   Continued recommendations are as follows: self care behaviors, positive social engagements, focusing on overall work/home/life balance, and focusing on positive physical and emotional wellness.   Suicidal/Homicidal: No  Therapist Response: Cindy Mcclure is continuing to improve relationships within the family. Cindy Mcclure is developing a non-avoidant approach to build overall confidence in self and needs. Cindy Mcclure is able to identify a list of key conflicts that trigger fear and worry and is able to develop problem solving skills to help manage conflict in the moment. These behaviors are reflective of both personal growth and progress. Treatment to continue as indicated.  Plan: Return again in 4 weeks.  Diagnosis: Axis I: MDD, recurrent, remission; GAD    Axis II: No diagnosis    [place], [name] 12/27/2020

## 2021-01-29 ENCOUNTER — Ambulatory Visit (INDEPENDENT_AMBULATORY_CARE_PROVIDER_SITE_OTHER): Payer: Medicare Other | Admitting: Licensed Clinical Social Worker

## 2021-01-29 ENCOUNTER — Other Ambulatory Visit: Payer: Self-pay

## 2021-01-29 DIAGNOSIS — Z5329 Procedure and treatment not carried out because of patient's decision for other reasons: Secondary | ICD-10-CM

## 2021-01-29 NOTE — Progress Notes (Signed)
[name] counselor tried to connect with patient for scheduled appointment via MyChart video text request x 2 and email request; also tried to connect via phone without success. [name] counselor left message for patient to call office number to reschedule OPT appointment.   Jeanmarie Plant, MSW, [name] Outpatient Therapist/Triage Specialist

## 2021-01-30 ENCOUNTER — Ambulatory Visit (INDEPENDENT_AMBULATORY_CARE_PROVIDER_SITE_OTHER): Payer: Medicare Other | Admitting: Licensed Clinical Social Worker

## 2021-01-30 ENCOUNTER — Other Ambulatory Visit: Payer: Self-pay

## 2021-01-30 DIAGNOSIS — F411 Generalized anxiety disorder: Secondary | ICD-10-CM | POA: Diagnosis not present

## 2021-01-30 DIAGNOSIS — F3342 Major depressive disorder, recurrent, in full remission: Secondary | ICD-10-CM

## 2021-01-30 NOTE — Progress Notes (Signed)
Virtual Visit via Audio Note  I connected with Cindy Mcclure on 01/30/21 at  9:00 AM EDT by a video enabled telemedicine application and verified that I am speaking with the correct person using two identifiers.  Location: Patient: home Provider: [facility]   I discussed the limitations of evaluation and management by telemedicine and the availability of in person appointments. The patient expressed understanding and agreed to proceed.  I discussed the assessment and treatment plan with the patient. The patient was provided an opportunity to ask questions and all were answered. The patient agreed with the plan and demonstrated an understanding of the instructions.   The patient was advised to call back or seek an in-person evaluation if the symptoms worsen or if the condition fails to improve as anticipated.  I provided 40 minutes of non-face-to-face time during this encounter.   Khamari Sheehan R Laken Rog, [name]   THERAPIST PROGRESS NOTE  Session Time: 9:20-10a  Participation Level: Active  Behavioral Response: NAAlertAnxious  Type of Therapy: Individual Therapy  Treatment Goals addressed: Anxiety  Interventions: CBT and Solution Focused  Summary: Cindy Mcclure is a 55 y.o. female who presents with symptoms associated with depression and anxiety. Pt feels that she is currently experiencing more anxiety than depression. Pt reports that overall mood is stable. Pt reports that she feels she is not handling anxiety/stress well right now.  Allowed pt to explore and express thoughts and feelings associated with recent life situations and external stressors. Discussed pts relationship with daughter--pt is continuing to struggle with estrangement between herself and daughter. Pts daughter is continuing to live in her mother's home and is not contributing financially.  Pt states that her older daughter showed similar behaviors at that age. Used psychoeducational approach by discussing  developmental stages with pt.   Discussed relationship between pt and mother--pt still feels like her mother tries to "mother" her daughter, which makes pt mad because pt feels she was raised by her grandmother, not her own mother "she wasn't ever there for me, why is she wanting to be there for my daughter?".  Allowed pt safe space to express feelings and offered unconditional support.  Continued recommendations are as follows: self care behaviors, positive social engagements, focusing on overall work/home/life balance, and focusing on positive physical and emotional wellness.    Suicidal/Homicidal: No  Therapist Response:   Plan: Return again in 4 weeks.  Diagnosis: Axis I: MDD, recurrent, remission; GAD    Axis II: No diagnosis    Rachel Bo Titus Drone, [name] 01/30/2021

## 2021-03-11 ENCOUNTER — Other Ambulatory Visit: Payer: Self-pay

## 2021-03-11 ENCOUNTER — Ambulatory Visit (INDEPENDENT_AMBULATORY_CARE_PROVIDER_SITE_OTHER): Payer: Medicare Other | Admitting: Licensed Clinical Social Worker

## 2021-03-11 DIAGNOSIS — F411 Generalized anxiety disorder: Secondary | ICD-10-CM | POA: Diagnosis not present

## 2021-03-11 DIAGNOSIS — F3342 Major depressive disorder, recurrent, in full remission: Secondary | ICD-10-CM

## 2021-03-11 NOTE — Progress Notes (Signed)
Virtual Visit via Video Note  I connected with Cindy Mcclure on 03/11/21 at  1:00 PM EDT by a video enabled telemedicine application and verified that I am speaking with the correct person using two identifiers.  Video connection was lost when less than 50% of the duration of the visit was complete, at which time the remainder of the visit was completed via audio only.  Location: Patient: home Provider: remote office [place], [place])    I discussed the limitations of evaluation and management by telemedicine and the availability of in person appointments. The patient expressed understanding and agreed to proceed.   I discussed the assessment and treatment plan with the patient. The patient was provided an opportunity to ask questions and all were answered. The patient agreed with the plan and demonstrated an understanding of the instructions.   The patient was advised to call back or seek an in-person evaluation if the symptoms worsen or if the condition fails to improve as anticipated.  I provided 60 minutes of non-face-to-face time during this encounter.   Rogue Pautler R Leandra Vanderweele, [name]   THERAPIST PROGRESS NOTE  Session Time: 1-2p  Participation Level: Active  Behavioral Response: NAAlertAnxious, Depressed, and Irritable  Type of Therapy: Individual Therapy  Treatment Goals addressed: Anxiety and Coping  Interventions: CBT, Solution Focused, Supportive, and Social Skills Training  Summary: Cindy Mcclure is a 55 y.o. female who presents with Continuing symptoms related to depression and anxiety diagnosis. Patient reports that overall mood has been fluctuating at times, and anxiety is triggered by situational circumstances, often triggered within the home. Patient reports good quality and quantity of sleep period patient reports that she is compliant with her medications. Allow patients a space to explore and express thoughts and feelings associated with recent external  stressors and life events. Patient reports that she is still continuing to have stress associated with her relationship with her daughter and relationship with her mother. Patient reports that both of them were accusing her of being selfish and self-centered when she only asks them for rides too get things that she needs. Patient states that daughter was accusing her of being narcissistic. Patient states that she reached out to her son to help clarify what the word meant, and if he felt she was narcissistic. Patient reports that her son does not think that she is narcissistic or has any narcissistic traits. Encouraged patient to have conversation with daughter and mother about the toxicity of ongoing relationship issues. Patient reports that daughter will frequently walked by her in the house and intentionally not speak on a regular basis. Patient reports that this causes her a high amount of distress, and that her daughter is aware of this. Clinician offered to mediate a family session if it would be beneficial to patient. Patient is going to think about it and follow up. Encourage patient to continue with as much physical activity she possibly can, engaging socially with friends, and continuing she utilize coping mechanisms that have been helpful in the past.Continued recommendations are as follows: self care behaviors, positive social engagements, focusing on overall work/home/life balance, and focusing on positive physical and emotional wellness.  .   Suicidal/Homicidal: No  Therapist Response: Continuing symptoms related to depression and anxiety diagnosis. Patient reports that overall mood has been fluctuating at times, and anxiety is triggered by situational circumstances, often triggered within the home. Patient reports good quality and quantity of sleep period patient reports that she is compliant with her medications. Allow patients  a space to explore and express thoughts and feelings associated with  recent external stressors and life events. Patient reports that she is still continuing to have stress associated with her relationship with her daughter and relationship with her mother. Patient reports that both of them were accusing her of being selfish and self-centered when she only asks them for rides too get things that she needs. Patient states that daughter was accusing her of being narcissistic. Patient states that she reached out to her son to help clarify what the word meant, and if he felt she was narcissistic. Patient reports that her son does not think that she is narcissistic or has any narcissistic traits. Encouraged patient to have conversation with daughter and mother about the toxicity of ongoing relationship issues. Patient reports that daughter will frequently walked by her in the house and intentionally not speak on a regular basis. Patient reports that this causes her a high amount of distress, and that her daughter is aware of this. Clinician offered to mediate a family session if it would be beneficial to patient. Patient is going to think about it and follow up. Encourage patient to continue with as much physical activity she possibly can, engaging socially with friends, and continuing she utilize coping mechanisms that have been helpful in the past.  Plan: Return again in 4 weeks.  Diagnosis: Axis I: MDD, recurrent; GAD    Axis II: No diagnosis    Rachel Bo Sanay Belmar, [name] 03/11/2021

## 2021-04-15 ENCOUNTER — Other Ambulatory Visit: Payer: Self-pay

## 2021-04-15 ENCOUNTER — Ambulatory Visit (INDEPENDENT_AMBULATORY_CARE_PROVIDER_SITE_OTHER): Payer: Medicare Other | Admitting: Licensed Clinical Social Worker

## 2021-04-15 DIAGNOSIS — F3342 Major depressive disorder, recurrent, in full remission: Secondary | ICD-10-CM | POA: Diagnosis not present

## 2021-04-15 DIAGNOSIS — F411 Generalized anxiety disorder: Secondary | ICD-10-CM

## 2021-04-15 NOTE — Progress Notes (Signed)
Virtual Visit via Video Note  I connected with Cindy Mcclure on 04/15/21 at  2:00 PM EDT by a video enabled telemedicine application and verified that I am speaking with the correct person using two identifiers.  Location: Patient: home Provider: remote office [place], [place])   I discussed the limitations of evaluation and management by telemedicine and the availability of in person appointments. The patient expressed understanding and agreed to proceed.   I discussed the assessment and treatment plan with the patient. The patient was provided an opportunity to ask questions and all were answered. The patient agreed with the plan and demonstrated an understanding of the instructions.   The patient was advised to call back or seek an in-person evaluation if the symptoms worsen or if the condition fails to improve as anticipated.  I provided 45 minutes of non-face-to-face time during this encounter.   Karriem Muench Mcclure Cindy Coppock, [name]   THERAPIST PROGRESS NOTE  Session Time: 2-2:45p  Participation Level: Active  Behavioral Response: NAAlertAnxious and Depressed  Type of Therapy: Individual Therapy  Treatment Goals addressed: Anxiety and Coping  Interventions: CBT, Supportive, and Family Systems  Summary: Cindy Mcclure is a 55 y.o. female who presents with Cindy reports continuing symptoms related to depression and anxiety diagnosis. Patient reports that she is talking more to her daughter and her mother. Allow patients safe space to explore and express thoughts and feelings associated with recent external stressors and life events. Patient reports that although she is having superficial conversations with daughter, their conversations never have much substance. Patient reports that her daughter has expressed that she doesn't want her to be her mother anymore, and that she gets angry when her mother even looks at her. Patient states that she recognizes that her daughter is currently  in therapy, and is hoping that her daughter and therapist will work through some of these issues. Patient reports that she wants to continue focusing on herself care and is currently reading, doing cross stitch, listening to the radio, watching TV, engaging in physical activity, and mindfulness moments sitting on the front porch. Encourage patient to continue with these activities.Continued recommendations are as follows: self care behaviors, positive social engagements, focusing on overall work/home/life balance, and focusing on positive physical and emotional wellness.     Suicidal/Homicidal: No  Therapist Response:Cindy Mcclure is able to identify an anxiety coping mechanism that has been successful in the past and increase its use.  Cindy Mcclure can verbalize an understanding of how thoughts, feelings, and behaviors contribute to anxiety and overall treatment. Pt is able to utilize behavioral strategies to manage depression and mood-related symptoms. These behaviors are reflective of both personal growth and progress. Treatment to continue as indicated.  Plan: Return again in 4 weeks.  Diagnosis: Axis I: MDD, recurrent; GAD    Axis II: No diagnosis    [place], [name] 04/15/2021

## 2021-05-13 ENCOUNTER — Ambulatory Visit: Payer: Medicare Other | Admitting: Licensed Clinical Social Worker

## 2021-05-13 ENCOUNTER — Other Ambulatory Visit: Payer: Self-pay

## 2021-05-13 DIAGNOSIS — Z5329 Procedure and treatment not carried out because of patient's decision for other reasons: Secondary | ICD-10-CM

## 2021-05-13 NOTE — Progress Notes (Signed)
[name] counselor tried to connect with patient for scheduled appointment via [place] audio without success x 2.  [name] counselor left message x 2 for patient to call office number to reschedule OPT appointment.

## 2021-05-31 ENCOUNTER — Ambulatory Visit (INDEPENDENT_AMBULATORY_CARE_PROVIDER_SITE_OTHER): Payer: Medicare Other | Admitting: Licensed Clinical Social Worker

## 2021-05-31 ENCOUNTER — Other Ambulatory Visit: Payer: Self-pay

## 2021-05-31 DIAGNOSIS — F411 Generalized anxiety disorder: Secondary | ICD-10-CM

## 2021-05-31 DIAGNOSIS — F3342 Major depressive disorder, recurrent, in full remission: Secondary | ICD-10-CM

## 2021-05-31 NOTE — Progress Notes (Signed)
Virtual Visit via Video Note  I connected with Cindy Mcclure on 05/31/21 at  8:00 AM EDT by a video enabled telemedicine application and verified that I am speaking with the correct person using two identifiers.  Location:home Patient: home Provider: remote office [place], [place])   I discussed the limitations of evaluation and management by telemedicine and the availability of in person appointments. The patient expressed understanding and agreed to proceed.  I discussed the assessment and treatment plan with the patient. The patient was provided an opportunity to ask questions and all were answered. The patient agreed with the plan and demonstrated an understanding of the instructions.   The patient was advised to call back or seek an in-person evaluation if the symptoms worsen or if the condition fails to improve as anticipated.  I provided 50 minutes of non-face-to-face time during this encounter.   [place], [name]   THERAPIST PROGRESS NOTE  Session Time: 8-850a  Participation Level: Active  Behavioral Response: Neat and Well GroomedAlertAnxious and Depressed  Type of Therapy: Individual Therapy  Treatment Goals addressed:  Goal: LTG: Reduce frequency, intensity, and duration of depression symptoms as evidenced by: pt self report Description: Pt reports mood stable--feels less depression symptoms than at last session Outcome: Progressing  Goal: STG: Myrtha WILL PARTICIPATE IN AT LEAST 80% OF SCHEDULED INDIVIDUAL PSYCHOTHERAPY SESSIONS Description: Pt on time for appt Outcome: Progressing  Interventions:   Intervention: WORK WITH [name] TO IDENTIFY THE MAJOR COMPONENTS OF A RECENT EPISODE OF DEPRESSION: PHYSICAL SYMPTOMS, MAJOR THOUGHTS AND IMAGES, AND MAJOR BEHAVIORS THEY EXPERIENCED  Intervention: Assist with coping skills and behavior  Intervention: Discuss self-management skills   Summary: Cindy Mcclure is a 55 y.o. female who presents with  improving symptoms related to depression diagnosis. Pt reports mood is stable and that she is managing stress and anxiety better. Pt reports good quality and quantity of sleep.Marland Kitchen   Allowed pt to explore and express thoughts and feelings associated with recent life situations and external stressors. Discussed pts relationship with daughter, relationship with mother, and some traumas in the past.   Continued recommendations are as follows: self care behaviors, positive social engagements, focusing on overall work/home/life balance, and focusing on positive physical and emotional wellness.    Suicidal/Homicidal: No  Therapist Response: Pt is continuing to apply interventions learned in session into daily life situations. Pt is currently on track to meet goals utilizing interventions mentioned above. Personal growth and progress noted. Treatment to continue as indicated.    Plan: Return again in 4 weeks.  Diagnosis: Axis I: MDD, recurrent    Axis II: No diagnosis    Rachel Bo Livianna Petraglia, [name] 05/31/2021

## 2021-05-31 NOTE — Plan of Care (Signed)
  Problem: Decrease depressive symptoms and improve levels of effective functioning Goal: LTG: Reduce frequency, intensity, and duration of depression symptoms as evidenced by: pt self report Description: Pt reports mood stable--feels less depression symptoms than at last session Outcome: Progressing Goal: STG: Minah WILL PARTICIPATE IN AT LEAST 80% OF SCHEDULED INDIVIDUAL PSYCHOTHERAPY SESSIONS Description: Pt on time for appt Outcome: Progressing Intervention: WORK WITH [name] TO IDENTIFY THE MAJOR COMPONENTS OF A RECENT EPISODE OF DEPRESSION: PHYSICAL SYMPTOMS, MAJOR THOUGHTS AND IMAGES, AND MAJOR BEHAVIORS THEY EXPERIENCED Intervention: Assist with coping skills and behavior Intervention: Discuss self-management skills

## 2021-07-02 ENCOUNTER — Other Ambulatory Visit: Payer: Self-pay

## 2021-07-02 ENCOUNTER — Ambulatory Visit (INDEPENDENT_AMBULATORY_CARE_PROVIDER_SITE_OTHER): Payer: Medicare Other | Admitting: Licensed Clinical Social Worker

## 2021-07-02 DIAGNOSIS — F3342 Major depressive disorder, recurrent, in full remission: Secondary | ICD-10-CM | POA: Diagnosis not present

## 2021-07-02 DIAGNOSIS — F411 Generalized anxiety disorder: Secondary | ICD-10-CM | POA: Diagnosis not present

## 2021-07-02 NOTE — Plan of Care (Signed)
  Problem: Decrease depressive symptoms and improve levels of effective functioning Goal: LTG: Reduce frequency, intensity, and duration of depression symptoms as evidenced by: pt self report Description: Pt reports mood stable--feels less depression symptoms than at last session Outcome: Progressing Goal: STG: Niajah WILL PARTICIPATE IN AT LEAST 80% OF SCHEDULED INDIVIDUAL PSYCHOTHERAPY SESSIONS Description: Pt on time for appt Outcome: Progressing Intervention: WORK WITH [name] TO IDENTIFY THE MAJOR COMPONENTS OF A RECENT EPISODE OF DEPRESSION: PHYSICAL SYMPTOMS, MAJOR THOUGHTS AND IMAGES, AND MAJOR BEHAVIORS THEY EXPERIENCED Intervention: Work with patient to identify the major components of a recent episode of anxiety: physical symptoms, major thoughts and images, and major behaviors they experienced Intervention: [place]

## 2021-07-02 NOTE — Progress Notes (Signed)
Virtual Visit via Video Note  I connected with Cindy Mcclure on 07/02/21 at 10:00 AM EST by a video enabled telemedicine application and verified that I am speaking with the correct person using two identifiers.  Video connection was lost when less than 50% of the duration of the visit was complete, at which time the remainder of the visit was completed via audio only.  Location: Patient: home Provider: remote office [place]indy [place]cl[place], [place])   I discussed the limitations of evaluation and management by telemedicine and the availability of in person appointments. The patient expressed understanding and agreed to proceed.  I discussed the assessment and treatment plan with the patient. The patient was provided an opportunity to ask questions and all were answered. The patient agreed with the plan and demonstrated an understanding of the instructions.   The patient was advised to call back or seek an in-person evaluation if the symptoms worsen or if the condition fails to improve as anticipated.  I provided 60 minutes of non-face-to-face time during this encounter.   Cindy Mcclure, Cindy Mcclure   THERAPIST PROGRESS NOTE  Session Time: 10-11a  Participation Level: Active  Behavioral Response: NAAlertAnxious  Type of Therapy: Individual Therapy  Treatment Goals addressed:  Problem: Decrease depressive symptoms and improve levels of effective functioning Goal: LTG: Reduce frequency, intensity, and duration of depression symptoms as evidenced by: pt self report Description: Pt reports mood stable--feels less depression symptoms than at last session Outcome: Progressing Goal: STG: Cindy Mcclure WILL PARTICIPATE IN AT LEAST 80% OF SCHEDULED INDIVIDUAL PSYCHOTHERAPY SESSIONS Description: Pt on time for appt Outcome: Progressing Interventions:  Intervention: WORK WITH Cindy Mcclure TO IDENTIFY THE MAJOR COMPONENTS OF A RECENT EPISODE OF DEPRESSION: PHYSICAL SYMPTOMS, MAJOR THOUGHTS AND IMAGES, AND MAJOR  BEHAVIORS THEY EXPERIENCED Intervention: Work with patient to identify the major components of a recent episode of anxiety: physical symptoms, major thoughts and images, and major behaviors they experienced Intervention: [place] ON COMMUNICATION PATTERNS IN RELATIONSHIPS   Summary: Cindy Mcclure is a 55 y.o. female who presents with improving symptoms related to depression diagnosis. Pt reports that she is not experiencing a lot of depression symptoms since last session. Pt reports that mood has been stable and that she has been getting good quality and quantity of sleep.    Allowed pt to explore and express thoughts and feelings associated with recent life situations and external stressors.  Explored relationship with daughters and with mother. Pt reports that she is continuing to feel dismissed by her daughter and mother. Pt reports that she feels that her mother is doing a better job of including her when going to the supermarket or [place].Pt is also continuing to be distressed over her mother and kids "thinking that I have bipolar". Pt also feels that she needs to justify her behavior to her mother and children as being fueled by medication side effects. Discussed subject at length and allowed pt to explore feelings. Pt feels that her mom "took over" the raising of her children and pt admits that she feels some resentment about it. Pt reports that she has a close relationship with son and he comes to visit at least every other day if not more.   Reviewed coping skills to manage stress/anxiety/depression.  Continued recommendations are as follows: self care behaviors, positive social engagements, focusing on overall work/home/life balance, and focusing on positive physical and emotional wellness.    Suicidal/Homicidal: No  Therapist Response: Pt is continuing to apply interventions learned in session  into daily life situations. Pt is currently on track to meet goals utilizing  interventions mentioned above. Personal growth and progress noted. Treatment to continue as indicated.    Plan: Return again in 4 weeks.  Diagnosis: Axis I: MDD, recurrent    Axis II: No diagnosis    Rachel Bo Maleta Pacha, Cindy Mcclure 07/02/2021

## 2021-08-20 ENCOUNTER — Other Ambulatory Visit: Payer: Self-pay

## 2021-08-20 ENCOUNTER — Ambulatory Visit (INDEPENDENT_AMBULATORY_CARE_PROVIDER_SITE_OTHER): Payer: Medicare Other | Admitting: Licensed Clinical Social Worker

## 2021-08-20 DIAGNOSIS — F3342 Major depressive disorder, recurrent, in full remission: Secondary | ICD-10-CM | POA: Diagnosis not present

## 2021-08-20 NOTE — Progress Notes (Deleted)
Virtual Visit via Audio Note  I connected with Cindy Mcclure on 08/20/21 at 11:00 AM EST by an audio enabled telemedicine application and verified that I am speaking with the correct person using two identifiers.  Location: Patient: home Provider: remote office [place], [place])   I discussed the limitations of evaluation and management by telemedicine and the availability of in person appointments. The patient expressed understanding and agreed to proceed.  I discussed the assessment and treatment plan with the patient. The patient was provided an opportunity to ask questions and all were answered. The patient agreed with the plan and demonstrated an understanding of the instructions.   The patient was advised to call back or seek an in-person evaluation if the symptoms worsen or if the condition fails to improve as anticipated.  I provided 30  minutes of non-face-to-face time during this encounter.   Cindy Mcclure Cindy Michell Kader, [name]   THERAPIST PROGRESS NOTE  Session Time: 11-1130a  Participation Level: Active  Behavioral Response: NAAlertAnxious  Type of Therapy: Individual Therapy  Treatment Goals addressed:  Problem: Decrease depressive symptoms and improve levels of effective functioning  Goal: LTG: Reduce frequency, intensity, and duration of depression symptoms as evidenced by: pt self report Description: Pt reports mood stable--feels less depression symptoms than at last session Outcome: Progressing  Goal: STG: Cindy Mcclure WILL PARTICIPATE IN AT LEAST 80% OF SCHEDULED INDIVIDUAL PSYCHOTHERAPY SESSIONS Description: Pt on time for appt Outcome: Progressing  Interventions:  Intervention: WORK WITH Cindy Mcclure TO IDENTIFY THE MAJOR COMPONENTS OF A RECENT EPISODE OF DEPRESSION: PHYSICAL SYMPTOMS, MAJOR THOUGHTS AND IMAGES, AND MAJOR BEHAVIORS THEY EXPERIENCED  Intervention: Work with patient to identify the major components of a recent episode of anxiety: physical symptoms, major  thoughts and images, and major behaviors they experienced  Intervention: [place] ON COMMUNICATION PATTERNS IN RELATIONSHIPS   Summary: Cindy Mcclure is a 56 y.o. female who presents with improving symptoms related to depression diagnosis. Pt reports that she is not experiencing a lot of depression symptoms since last session. Pt reports that mood has been stable and that she has been getting good quality and quantity of sleep.    Allowed pt to explore and express thoughts and feelings associated with recent life situations and external stressors.  Explored relationship with daughters and with mother. Pt feels like her daughter is making more of an effort to talk/interact.   Pts daughter got a new dog as family pet so pt has been taking advantage of the walking benefits and feels the exercise is helping her physically and emotionally. Encouraged pts engagement w/ dog and exercising.   Reviewed coping skills to manage stress/anxiety/depression.  Continued recommendations are as follows: self care behaviors, positive social engagements, focusing on overall work/home/life balance, and focusing on positive physical and emotional wellness.    Suicidal/Homicidal: No  Therapist Response: Pt is continuing to apply interventions learned in session into daily life situations. Pt is currently on track to meet goals utilizing interventions mentioned above. Personal growth and progress noted. Treatment to continue as indicated.    Plan: Return again in 4 weeks.  Diagnosis: Axis I: MDD, recurrent    Axis II: No diagnosis    Cindy Mcclure Cindy [place], [name] 08/20/2021

## 2021-08-20 NOTE — Progress Notes (Signed)
Virtual Visit via Video Note  I connected with Cindy Mcclure on 08/20/21 at 11:00 AM EST by a video enabled telemedicine application and verified that I am speaking with the correct person using two identifiers.  Location: Patient: home Provider: remote office [place], [place])   I discussed the limitations of evaluation and management by telemedicine and the availability of in person appointments. The patient expressed understanding and agreed to proceed.  I discussed the assessment and treatment plan with the patient. The patient was provided an opportunity to ask questions and all were answered. The patient agreed with the plan and demonstrated an understanding of the instructions.   The patient was advised to call back or seek an in-person evaluation if the symptoms worsen or if the condition fails to improve as anticipated.  I provided 30 minutes of non-face-to-face time during this encounter.   Cindy Siebert R Daulton Harbaugh, [name]   THERAPIST PROGRESS NOTE  Session Time: 06-1129  Participation Level: Active  Behavioral Response: NAAlertAnxious  Type of Therapy: Individual Therapy  Treatment Goals addressed:  Problem: Decrease depressive symptoms and improve levels of effective functioning  Goal: LTG: Reduce frequency, intensity, and duration of depression symptoms as evidenced by: pt self report Description: Pt reports mood stable--feels less depression symptoms than at last session Outcome: Progressing  Goal: STG: Cindy Mcclure WILL PARTICIPATE IN AT LEAST 80% OF SCHEDULED INDIVIDUAL PSYCHOTHERAPY SESSIONS Description: Pt on time for appt Outcome: Progressing  Interventions:  Intervention: WORK WITH Cindy Mcclure TO IDENTIFY THE MAJOR COMPONENTS OF A RECENT EPISODE OF DEPRESSION: PHYSICAL SYMPTOMS, MAJOR THOUGHTS AND IMAGES, AND MAJOR BEHAVIORS THEY EXPERIENCED  Intervention: Work with patient to identify the major components of a recent episode of anxiety: physical symptoms, major  thoughts and images, and major behaviors they experienced  Intervention: [place] ON COMMUNICATION PATTERNS IN RELATIONSHIPSS   Summary: Cindy Mcclure is a 56 y.o. female who presents with improving symptoms related to depression diagnosis. Pt reports that she is not experiencing a lot of depression symptoms since last session. Pt reports that mood has been stable and that she has been getting good quality and quantity of sleep.    Allowed pt to explore and express thoughts and feelings associated with recent life situations and external stressors.  Explored relationship with daughters and with mother. Pt feels that relationship with mother and daughter have improved since last session. Pt reports that she feels her daughter and herself are communicating with one another better and more frequently than before. Pt reports that she feels her mother is continuing to include her with visits to stores.   Pt reports that her daughter recently got a dog and that pt is the primary caretaker for the dog. Pt doesn't mind--she has noticed that she is walking more with the dog and feels like she has a purpose being the dog's caregiver. Discussed benefits of walking/exercise.  Pt reports that walking/exercise does make her feel better.   Continued recommendations are as follows: self care behaviors, positive social engagements, focusing on overall work/home/life balance, and focusing on positive physical and emotional wellness.    Suicidal/Homicidal: No  Therapist Response: Pt is continuing to apply interventions learned in session into daily life situations. Pt is currently on track to meet goals utilizing interventions mentioned above. Personal growth and progress noted. Treatment to continue as indicated.    Plan: Return again in 4 weeks.  Diagnosis: Axis I: MDD, recurrent    Axis II: No diagnosis    Cindy Stan R Kaulder Zahner,  [name] 08/20/2021

## 2021-09-24 ENCOUNTER — Other Ambulatory Visit: Payer: Self-pay

## 2021-09-24 ENCOUNTER — Ambulatory Visit (INDEPENDENT_AMBULATORY_CARE_PROVIDER_SITE_OTHER): Payer: Medicare Other | Admitting: Licensed Clinical Social Worker

## 2021-09-24 DIAGNOSIS — F411 Generalized anxiety disorder: Secondary | ICD-10-CM

## 2021-09-24 DIAGNOSIS — F3342 Major depressive disorder, recurrent, in full remission: Secondary | ICD-10-CM | POA: Diagnosis not present

## 2021-09-24 NOTE — Plan of Care (Signed)
°  Problem: Decrease depressive symptoms and improve levels of effective functioning Goal: LTG: Reduce frequency, intensity, and duration of depression symptoms as evidenced by: pt self report Description: Pt reports mood stable--feels less depression symptoms than at last session Outcome: Progressing Goal: STG: Cindy Mcclure WILL PARTICIPATE IN AT LEAST 80% OF SCHEDULED INDIVIDUAL PSYCHOTHERAPY SESSIONS Description: Pt on time for appt Outcome: Progressing  Intervention: WORK WITH [name] TO IDENTIFY THE MAJOR COMPONENTS OF A RECENT EPISODE OF DEPRESSION: PHYSICAL SYMPTOMS, MAJOR THOUGHTS AND IMAGES, AND MAJOR BEHAVIORS THEY EXPERIENCED  Intervention: Work with patient to identify the major components of a recent episode of anxiety: physical symptoms, major thoughts and images, and major behaviors they experienced  Intervention: [place]

## 2021-09-24 NOTE — Progress Notes (Signed)
Virtual Visit via Video Note  I connected with Janalyn Harder Parilla on 09/24/21 at 11:00 AM EST by a video enabled telemedicine application and verified that I am speaking with the correct person using two identifiers.  Location: Patient: home Provider: remote office [place], [place])   I discussed the limitations of evaluation and management by telemedicine and the availability of in person appointments. The patient expressed understanding and agreed to proceed.  I discussed the assessment and treatment plan with the patient. The patient was provided an opportunity to ask questions and all were answered. The patient agreed with the plan and demonstrated an understanding of the instructions.   The patient was advised to call back or seek an in-person evaluation if the symptoms worsen or if the condition fails to improve as anticipated.  I provided 30 minutes of non-face-to-face time during this encounter.   Mikeisha Lemonds R [place] Hudman, [name]   THERAPIST PROGRESS NOTE  Session Time: 06-1129  Participation Level: Active  Behavioral Response: NAAlertAnxious  Type of Therapy: Individual Therapy  Treatment Goals addressed:  Problem: Decrease depressive symptoms and improve levels of effective functioning  Goal: LTG: Reduce frequency, intensity, and duration of depression symptoms as evidenced by: pt self report Description: Pt reports mood stable--feels less depression symptoms than at last session Outcome: Progressing  Goal: STG: [place] WILL PARTICIPATE IN AT LEAST 80% OF SCHEDULED INDIVIDUAL PSYCHOTHERAPY SESSIONS Description: Pt on time for appt Outcome: Progressing  Interventions:  Intervention: WORK WITH [name] TO IDENTIFY THE MAJOR COMPONENTS OF A RECENT EPISODE OF DEPRESSION: PHYSICAL SYMPTOMS, MAJOR THOUGHTS AND IMAGES, AND MAJOR BEHAVIORS THEY EXPERIENCED  Intervention: Work with patient to identify the major components of a recent episode of anxiety: physical symptoms, major  thoughts and images, and major behaviors they experienced  Intervention: [place] ON COMMUNICATION PATTERNS IN RELATIONSHIPSS   Summary: HADLEA FURUYA is a 56 y.o. female who presents with improving symptoms related to depression diagnosis. Pt reports that she is not experiencing a lot of depression symptoms since last session. Pt reports that mood has been stable and that she has been getting good quality and quantity of sleep.    Allowed pt to explore and express thoughts and feelings associated with recent life situations and external stressors. Pt reports that she is continuing to have stress associated with being the caregiver to her daughter's dog. Pt feels that being this caregiver has given her a responsibility and she is getting more physical activity--walking the dog multiple times per day.  Pt states that things are back to the way that things "used to be". Pt feels daughter isn't talking to her and that her mom is leaving her out--going places and not asking pt to go with her. Pt states she has been losing weight and needs new clothes and has no way to go to the store to get new clothes. Pt states she may decide to purchase items online instead.   Pt expresses concern over her son who just had a baby and is also high on drugs. Pt reports that she misses the close relationship she had with her son.  Continued recommendations are as follows: self care behaviors, positive social engagements, focusing on overall work/home/life balance, and focusing on positive physical and emotional wellness.    Suicidal/Homicidal: No  Therapist Response: Pt is continuing to apply interventions learned in session into daily life situations. Pt is currently on track to meet goals utilizing interventions mentioned above. Personal growth and progress noted. Treatment to continue  as indicated.   Plan: Return again in 4 weeks.  Diagnosis: Axis I: MDD, recurrent    Axis II: No  diagnosis   Rachel Bo Roselynne Lortz, [name] 09/24/2021

## 2021-11-11 ENCOUNTER — Other Ambulatory Visit: Payer: Self-pay

## 2021-11-11 ENCOUNTER — Ambulatory Visit (INDEPENDENT_AMBULATORY_CARE_PROVIDER_SITE_OTHER): Payer: Medicare Other | Admitting: Licensed Clinical Social Worker

## 2021-11-11 DIAGNOSIS — F3341 Major depressive disorder, recurrent, in partial remission: Secondary | ICD-10-CM

## 2021-11-11 NOTE — Plan of Care (Signed)
?  Problem: Decrease depressive symptoms and improve levels of effective functioning Goal: LTG: Reduce frequency, intensity, and duration of depression symptoms as evidenced by: pt self report Description: Pt reports mood stable--feels less depression symptoms than at last session Outcome: Progressing Goal: STG: Cindy Mcclure WILL PARTICIPATE IN AT LEAST 80% OF SCHEDULED INDIVIDUAL PSYCHOTHERAPY SESSIONS Description: Pt on time for appt Outcome: Progressing   

## 2021-11-11 NOTE — Progress Notes (Signed)
Virtual Visit via Video Note ? ?I connected with Cindy Mcclure on 11/11/21 at 11:00 AM EDT by a video enabled telemedicine application and verified that I am speaking with the correct person using two identifiers. ? ?Location: ?Patient: home ?Provider: remote office [place], [place]) ?  ?I discussed the limitations of evaluation and management by telemedicine and the availability of in person appointments. The patient expressed understanding and agreed to proceed. ?  ?I discussed the assessment and treatment plan with the patient. The patient was provided an opportunity to ask questions and all were answered. The patient agreed with the plan and demonstrated an understanding of the instructions. ?  ?The patient was advised to call back or seek an in-person evaluation if the symptoms worsen or if the condition fails to improve as anticipated. ? ?I provided 30 minutes of non-face-to-face time during this encounter. ? ? ?Cindy Morden R Malinda Mayden, [name] ? ? ?THERAPIST PROGRESS NOTE ? ?Session Time: 66-5993T ? ?Participation Level: Active ? ?Behavioral Response: Neat and Well GroomedAlertDepressed ? ?Type of Therapy: Individual Therapy ? ?Treatment Goals addressed: Problem: Decrease depressive symptoms and improve levels of effective functioning ? ?Goal: LTG: Reduce frequency, intensity, and duration of depression symptoms as evidenced by: pt self report ?Description: Pt reports mood stable--feels less depression symptoms than at last session ?Outcome: Progressing ? ?Goal: STG: Cindy Mcclure WILL PARTICIPATE IN AT LEAST 80% OF SCHEDULED INDIVIDUAL PSYCHOTHERAPY SESSIONS ?Description: Pt on time for appt ?Outcome: Progressing ?  ? ?ProgressTowards Goals: Progressing ? ?Interventions: CBT ? ?Summary: Cindy Mcclure is a 56 y.o. female who presents with improving symptoms related to depression..  ? ? Lost weight ozympic ? ?Relationship with mom--dad is sick and she is helping him out. Daddy is selling some [place]. Pottery. Pt has  not gone up there to see them.  One year ago.  Mom and daughter wouldn't take me to see grandfather's nephew. Relationship with father:  Father stays interested in his work: owns a business. No time for anyone else.  Hes asked my son to help him work on trailers/work/mow.  ? ?Lots of loss last year. Discussed psychological impact and how pt is managing symptoms. Grief support given.  ? ?Son:  didn't want to go to rehab. His girlfriend went into labor.I think he should have.  "Needed money to pay back a friend". Buying drugs. Had a baby boy. Pt feels like she is no longer giving him money--wants to set a healthy boundary with him and with herself.  ? ?Oldest daughter pregnant again:  4th grandchild.  ? ?Medical: move slower outside when walking dog due to leg still recovering. Praised pts ability to go outside and walk as part of her self care.  ? ?Food/appetite: on a diet before the ozympic. Made me eat less than I was eating before. Drink water with tea mix. Pt is losing weight and wants other people to notice and mention it to her. "I'm trying to do better for myself but nobody is noticing"  ? ?Self care: watch shows on TV. Dog wants so much attention. Wants to go back to cross stitch. Encouraged continuing self care.  ? ?Continued recommendations are as follows: self care behaviors, positive social engagements, focusing on overall work/home/life balance, and focusing on positive physical and emotional wellness.  ? ?Suicidal/Homicidal: No ? ?Therapist Response: Pt is continuing to apply interventions learned in session into daily life situations. Pt is currently on track to meet goals utilizing interventions mentioned above. Personal growth and progress noted. Treatment  to continue as indicated.  ? ? ?Plan: Return again in 4 weeks. ? ?Diagnosis: MDD (major depressive disorder), recurrent, in partial remission (Cindy Mcclure) ? ?Collaboration of Care: Other pt encouraged to continue care with psychiatrist of record,  Dr.Eappen ? ?Patient/Guardian was advised Release of Information must be obtained prior to any record release in order to collaborate their care with an outside provider. Patient/Guardian was advised if they have not already done so to contact the registration department to sign all necessary forms in order for [place] to release information regarding their care.  ? ?Consent: Patient/Guardian gives verbal consent for treatment and assignment of benefits for services provided during this visit. Patient/Guardian expressed understanding and agreed to proceed.  ? ?Cindy Thal R Broly Hatfield, [name] ?11/11/2021 ? ?

## 2021-12-19 ENCOUNTER — Ambulatory Visit: Payer: Medicare Other | Admitting: Psychiatry

## 2022-01-06 ENCOUNTER — Other Ambulatory Visit: Payer: Self-pay | Admitting: Family Medicine

## 2022-01-06 DIAGNOSIS — Z1231 Encounter for screening mammogram for malignant neoplasm of breast: Secondary | ICD-10-CM

## 2022-01-09 ENCOUNTER — Ambulatory Visit (INDEPENDENT_AMBULATORY_CARE_PROVIDER_SITE_OTHER): Payer: Medicare Other | Admitting: Licensed Clinical Social Worker

## 2022-01-09 DIAGNOSIS — F3341 Major depressive disorder, recurrent, in partial remission: Secondary | ICD-10-CM

## 2022-01-09 NOTE — Plan of Care (Signed)
  Problem: Decrease depressive symptoms and improve levels of effective functioning Goal: LTG: Reduce frequency, intensity, and duration of depression symptoms as evidenced by: pt self report Description: Pt reports mood stable--feels less depression symptoms than at last session Outcome: Progressing Goal: STG: [place] WILL PARTICIPATE IN AT LEAST 80% OF SCHEDULED INDIVIDUAL PSYCHOTHERAPY SESSIONS Description: Pt on time for appt Outcome: Progressing Intervention: Encourage verbalization of feelings/concerns/expectations Note: Encouraged/supported Intervention: Monitor coping skills and behavior Intervention: REVIEW PLEASE SKILLS (TREAT PHYSICAL ILLNESS, BALANCE EATING, AVOID MOOD-ALTERING SUBSTANCES, BALANCE SLEEP AND GET EXERCISE) WITH [name] Note: reviewed Intervention: Encourage family support

## 2022-01-09 NOTE — Progress Notes (Signed)
Virtual Visit via Video Note  I connected with Cindy Mcclure on 01/10/22 at  1:00 PM EDT by a video enabled telemedicine application and verified that I am speaking with the correct person using two identifiers.  Location: Patient: home Provider: remote office [place], [place])   I discussed the limitations of evaluation and management by telemedicine and the availability of in person appointments. The patient expressed understanding and agreed to proceed.   I discussed the assessment and treatment plan with the patient. The patient was provided an opportunity to ask questions and all were answered. The patient agreed with the plan and demonstrated an understanding of the instructions.   The patient was advised to call back or seek an in-person evaluation if the symptoms worsen or if the condition fails to improve as anticipated.  I provided 30 minutes of non-face-to-face time during this encounter.   Cindy Mcclure R Cindy Umar, [name]   THERAPIST PROGRESS NOTE  Session Time: 1-130p   Participation Level: Active  Behavioral Response: Neat and Well GroomedAlertDepressed  Type of Therapy: Individual Therapy  Treatment Goals addressed: Problem: Decrease depressive symptoms and improve levels of effective functioning Goal: LTG: Reduce frequency, intensity, and duration of depression symptoms as evidenced by: pt self report Description: Pt reports mood stable--feels less depression symptoms than at last session Outcome: Progressing Goal: STG: Cindy Mcclure WILL PARTICIPATE IN AT LEAST 80% OF SCHEDULED INDIVIDUAL PSYCHOTHERAPY SESSIONS Description: Pt on time for appt Outcome: Progressing Intervention: Encourage verbalization of feelings/concerns/expectations Note: Encouraged/supported Intervention: Theatre stage manager and behavior Intervention: REVIEW PLEASE SKILLS (TREAT PHYSICAL ILLNESS, BALANCE EATING, AVOID MOOD-ALTERING SUBSTANCES, BALANCE SLEEP AND GET EXERCISE) WITH Cindy Mcclure Note:  reviewed Intervention: Encourage family support    ProgressTowards Goals: Progressing  Interventions: CBT  Summary: Cindy Mcclure is a 56 y.o. female who presents with improving symptoms related to depression.. Pt reports mood stable and feels good about exercising on a regular basis.  Allowed pt to explore and express thoughts and feelings associated with recent life situations and external stressors. Discussed: relationship with daughters, relationship with son, relationship with mother, and overall psychological impact of these stressors recently. Discussed pt walking daughter's dog multiple times per day and pt feeling good about it.   Pt presented with positive, smiling affect.   Continued recommendations are as follows: self care behaviors, positive social engagements, focusing on overall work/home/life balance, and focusing on positive physical and emotional wellness.   Suicidal/Homicidal: No  Therapist Response: Pt is continuing to apply interventions learned in session into daily life situations. Pt is currently on track to meet goals utilizing interventions mentioned above. Personal growth and progress noted. Treatment to continue as indicated.    Plan: Return again in 4 weeks.  Diagnosis: MDD (major depressive disorder), recurrent, in partial remission (Cindy Mcclure)  Collaboration of Care: Other pt encouraged to continue care with psychiatrist of record, Cindy Mcclure  Patient/Guardian was advised Release of Information must be obtained prior to any record release in order to collaborate their care with an outside provider. Patient/Guardian was advised if they have not already done so to contact the registration department to sign all necessary forms in order for [place] to release information regarding their care.   Consent: Patient/Guardian gives verbal consent for treatment and assignment of benefits for services provided during this visit. Patient/Guardian expressed understanding and  agreed to proceed.   [place], [name] 01/10/2022

## 2022-02-05 ENCOUNTER — Ambulatory Visit
Admission: RE | Admit: 2022-02-05 | Discharge: 2022-02-05 | Disposition: A | Discharge status: Home / Self Care | Payer: Medicare Other | Source: Ambulatory Visit | Attending: Family Medicine | Admitting: Family Medicine

## 2022-02-05 DIAGNOSIS — Z1231 Encounter for screening mammogram for malignant neoplasm of breast: Secondary | ICD-10-CM | POA: Diagnosis not present

## 2022-02-19 ENCOUNTER — Emergency Department: Payer: Medicare Other

## 2022-02-19 ENCOUNTER — Other Ambulatory Visit: Payer: Self-pay

## 2022-02-19 ENCOUNTER — Emergency Department
Admission: EM | Admit: 2022-02-19 | Discharge: 2022-02-19 | Disposition: A | Discharge status: Home / Self Care | Payer: Medicare Other | Attending: Emergency Medicine | Admitting: Emergency Medicine

## 2022-02-19 DIAGNOSIS — R911 Solitary pulmonary nodule: Secondary | ICD-10-CM | POA: Diagnosis not present

## 2022-02-19 DIAGNOSIS — R7989 Other specified abnormal findings of blood chemistry: Secondary | ICD-10-CM

## 2022-02-19 DIAGNOSIS — R791 Abnormal coagulation profile: Secondary | ICD-10-CM | POA: Insufficient documentation

## 2022-02-19 DIAGNOSIS — R079 Chest pain, unspecified: Secondary | ICD-10-CM | POA: Insufficient documentation

## 2022-02-19 LAB — CBC
HCT: 38.2 % (ref 36.0–46.0)
Hemoglobin: 12.3 g/dL (ref 12.0–15.0)
MCH: 30.7 pg (ref 26.0–34.0)
MCHC: 32.2 g/dL (ref 30.0–36.0)
MCV: 95.3 fL (ref 80.0–100.0)
Platelets: 275 10*3/uL (ref 150–400)
RBC: 4.01 MIL/uL (ref 3.87–5.11)
RDW: 13.2 % (ref 11.5–15.5)
WBC: 8.2 10*3/uL (ref 4.0–10.5)
nRBC: 0 % (ref 0.0–0.2)

## 2022-02-19 LAB — HEPATIC FUNCTION PANEL
ALT: 17 U/L (ref 0–44)
AST: 27 U/L (ref 15–41)
Albumin: 4.1 g/dL (ref 3.5–5.0)
Alkaline Phosphatase: 73 U/L (ref 38–126)
Bilirubin, Direct: <0.1 mg/dL (ref 0.0–0.2)
Total Bilirubin: 0.5 mg/dL (ref 0.3–1.2)
Total Protein: 7.1 g/dL (ref 6.5–8.1)

## 2022-02-19 LAB — BASIC METABOLIC PANEL
Anion gap: 6 (ref 5–15)
BUN: 13 mg/dL (ref 6–20)
CO2: 24 mmol/L (ref 22–32)
Calcium: 8.8 mg/dL — ABNORMAL LOW (ref 8.9–10.3)
Chloride: 110 mmol/L (ref 98–111)
Creatinine, Ser: 1.13 mg/dL — ABNORMAL HIGH (ref 0.44–1.00)
GFR, Estimated: 57 mL/min — ABNORMAL LOW (ref >60)
Glucose, Bld: 91 mg/dL (ref 70–99)
Potassium: 3.7 mmol/L (ref 3.5–5.1)
Sodium: 140 mmol/L (ref 135–145)

## 2022-02-19 LAB — D-DIMER, QUANTITATIVE: D-Dimer, Quant: 1.36 ug/mL-FEU — ABNORMAL HIGH (ref 0.00–0.50)

## 2022-02-19 LAB — TROPONIN I (HIGH SENSITIVITY)
Troponin I (High Sensitivity): 3 ng/L (ref <18)
Troponin I (High Sensitivity): 3 ng/L (ref <18)

## 2022-02-19 LAB — POC URINE PREG, ED: Preg Test, Ur: NEGATIVE

## 2022-02-19 MED ORDER — NAPROXEN 500 MG PO TABS
500.0000 mg | ORAL_TABLET | Freq: Once | ORAL | Status: AC
  Administered 2022-02-19: 500 mg via ORAL
  Filled 2022-02-19: qty 1

## 2022-02-19 MED ORDER — IOHEXOL 350 MG/ML SOLN
75.0000 mL | Freq: Once | INTRAVENOUS | Status: AC | PRN
  Administered 2022-02-19: 75 mL via INTRAVENOUS

## 2022-02-19 NOTE — ED Provider Notes (Signed)
[facility] Provider Note    Event Date/Time   First MD Initiated Contact with Patient 02/19/22 1229     (approximate)   History   Chest Pain   HPI  Cindy Mcclure is a 56 y.o. female with past medical history of anxiety, arthritis, GERD, panic attacks, chronic incontinence, and seizures who presents for evaluation of some right-sided chest pain.  She states it started 3 days ago the day after she was doing some sweeping.  She is not sure if she strained a muscle or not.  She took some Tylenol and omeprazole but this did not help much.  It is worse with deep breathing.  No specific back pain, fevers, cough, shortness of breath, left-sided chest pain, abdominal pain, nausea, vomiting, diarrhea or rash.  He denies tobacco abuse illicit drug use or EtOH use.  No other acute concerns at this time.  No prior similar episodes.  She cannot identify any other clear alleviating or aggravating factors.    Past Medical History:  Diagnosis Date   Allergic rhinitis    Anginal pain [facility])    sees dr Clayborn Bigness   Anxiety    Arthritis    all over...knees, ankles, back   Back pain    Cervical radiculitis    no surgery, just cortisone shots.     Constipation    Constipation    DDD (degenerative disc disease), cervical    Depression    Dermatophytosis of nail 2019   both feet   GERD (gastroesophageal reflux disease)    History of kidney stones    h/o   Nonrheumatic mitral valve regurgitation    Osteoarthritis    Panic attacks    Plantar fasciitis    bilaterally   Seizures ([place]) 1972   unsure of when she had last seizure due to petit mal type. does not know what brings on a seizure.   Stress bladder incontinence, female    Urinary incontinence      Physical Exam  Triage Vital Signs: ED Triage Vitals  Enc Vitals Group     BP 02/19/22 0904 102/65     Pulse Rate 02/19/22 0904 73     Resp 02/19/22 0904 18     Temp 02/19/22 0904 97.8 F (36.6 C)     Temp  Source 02/19/22 0904 Oral     SpO2 02/19/22 0904 100 %     Weight --      Height --      Head Circumference --      Peak Flow --      Pain Score 02/19/22 0905 5     Pain Loc --      Pain Edu? --      Excl. in [place]? --     Most recent vital signs: Vitals:   02/19/22 1330 02/19/22 1430  BP: 105/72 106/67  Pulse: 60 64  Resp: 14 14  Temp:    SpO2: 100% 97%    General: Awake, no distress.  CV:  Good peripheral perfusion.  2+ radial pulses. Resp:  Normal effort.  Clear bilaterally. Abd:  No distention.  Soft. Other:  No rash over the right chest.   ED Results / Procedures / Treatments  Labs (all labs ordered are listed, but only abnormal results are displayed) Labs Reviewed  BASIC METABOLIC PANEL - Abnormal; Notable for the following components:      Result Value   Creatinine, Ser 1.13 (*)    Calcium 8.8 (*)  GFR, Estimated 57 (*)    All other components within normal limits  D-DIMER, QUANTITATIVE - Abnormal; Notable for the following components:   D-Dimer, Quant 1.36 (*)    All other components within normal limits  CBC  HEPATIC FUNCTION PANEL  POC URINE PREG, ED  TROPONIN I (HIGH SENSITIVITY)  TROPONIN I (HIGH SENSITIVITY)     EKG  EKG is remarkable sinus rhythm with a ventricular rate of 70, normal axis, unremarkable intervals without clear evidence of acute ischemia or significant arrhythmia.   RADIOLOGY Chest reviewed by myself shows no focal consoidation, effusion, edema, pneumothorax or other clear acute thoracic process. I also reviewed radiology interpretation and agree with findings described.  CTA chest on my interpretation without evidence of large PE, pneumonia, infarct, effusion, edema or other clear acute process.  I also reviewed radiology interpretation and agree their findings of stable small pulmonary nodules they note do not require specific follow-up without other acute process.    PROCEDURES:  Critical Care performed:  No  Procedures    MEDICATIONS ORDERED IN ED: Medications  naproxen (NAPROSYN) tablet 500 mg (500 mg Oral Given 02/19/22 1258)  iohexol (OMNIPAQUE) 350 MG/ML injection 75 mL (75 mLs Intravenous Contrast Given 02/19/22 1451)     IMPRESSION / MDM / ASSESSMENT AND PLAN / ED COURSE  I reviewed the triage vital signs and the nursing notes. Patient's presentation is most consistent with acute presentation with potential threat to life or bodily function.                               Differential diagnosis includes, but is not limited to ACS, PE, pneumonia, bronchitis, pleurisy and MSK.  Lower suspicion at this time based on her history and exam for dissection or cellulitis or shingles.  EKG is remarkable sinus rhythm with a ventricular rate of 70, normal axis, unremarkable intervals without clear evidence of acute ischemia or significant arrhythmia.  Given nonelevated troponin x2 I have low suspicion for ACS or myocarditis.  Chest reviewed by myself shows no focal consoidation, effusion, edema, pneumothorax or other clear acute thoracic process. I also reviewed radiology interpretation and agree with findings described.  CTA chest obtained due to elevated D-dimer on my interpretation without evidence of large PE, pneumonia, infarct, effusion, edema or other clear acute process.  I also reviewed radiology interpretation and agree their findings of stable small pulmonary nodules they note do not require specific follow-up without other acute process.  CBC without leukocytosis or acute anemia.  BMP without significant electrolyte or metabolic derangements.  Active function panel is unremarkable and there is no right upper quadrant tenderness to suggest referred pain from cholecystitis or choledocholithiasis.  I suspect MSK versus pleurisy versus other not immediately life-threatening process.  I think at this point I considered admission patient is appropriate for discharge he will continue  outpatient evaluation.  Discussed returning to emergency room for any new or worsening of symptoms.  Discharged in stable condition.  Strict turn precautions advised and discussed.      FINAL CLINICAL IMPRESSION(S) / ED DIAGNOSES   Final diagnoses:  Chest pain, unspecified type  Positive D dimer  Lung nodule     Rx / DC Orders   ED Discharge Orders     None        Note:  This document was prepared using Dragon voice recognition software and may include unintentional dictation errors.   Hulan Saas  P, MD 02/19/22 1525

## 2022-02-19 NOTE — ED Triage Notes (Signed)
Pt comes via POV from home with c/o CP that started on Sunday. Pt states it has continued the last few days and gotten worse. Pt states it started at rib cage right side and has now moved to mid sternal and back. Pt denies any SOB. Pt states some nausea.

## 2022-02-19 NOTE — Discharge Instructions (Addendum)
Your CT today showed: FINDINGS: Cardiovascular: Aorta normal caliber without aneurysm or dissection. Heart unremarkable. No pericardial effusion. Pulmonary arteries well opacified and patent. No evidence of pulmonary embolism.   Mediastinum/Nodes: Esophagus normal appearance. Base of cervical region normal appearance. No thoracic adenopathy.   Lungs/Pleura: Minimal dependent atelectasis. Subpleural nodule LEFT lower lobe 5 mm image 55 unchanged. 3 mm RIGHT upper lobe nodule image 53 unchanged. 3 mm RIGHT upper lobe nodule image 46 unchanged. No pulmonary infiltrate, pleural effusion, pneumothorax or new mass/nodule.   Upper Abdomen: Post cholecystectomy.  Otherwise unremarkable.   Musculoskeletal: Old healed sternal fracture.   Review of the MIP images confirms the above findings.   IMPRESSION: No evidence of pulmonary embolism.   Stable small pulmonary nodules; no follow-up imaging recommended.   No acute intrathoracic abnormalities.

## 2022-02-20 ENCOUNTER — Telehealth: Payer: Self-pay | Admitting: Emergency Medicine

## 2022-04-07 ENCOUNTER — Ambulatory Visit (INDEPENDENT_AMBULATORY_CARE_PROVIDER_SITE_OTHER): Payer: Medicare Other | Admitting: Licensed Clinical Social Worker

## 2022-04-07 DIAGNOSIS — F411 Generalized anxiety disorder: Secondary | ICD-10-CM

## 2022-04-07 DIAGNOSIS — F3341 Major depressive disorder, recurrent, in partial remission: Secondary | ICD-10-CM | POA: Diagnosis not present

## 2022-04-07 NOTE — Progress Notes (Signed)
Virtual Visit via Video Note  I connected with [name] on 04/07/22 at  1:00 PM EDT by a video enabled telemedicine application and verified that I am speaking with the correct person using two identifiers.  Location: Patient: home Provider: remote office [place], [place])   I discussed the limitations of evaluation and management by telemedicine and the availability of in person appointments. The patient expressed understanding and agreed to proceed.   I discussed the assessment and treatment plan with the patient. The patient was provided an opportunity to ask questions and all were answered. The patient agreed with the plan and demonstrated an understanding of the instructions.   The patient was advised to call back or seek an in-person evaluation if the symptoms worsen or if the condition fails to improve as anticipated.  I provided 60 minutes of non-face-to-face time during this encounter.   Khaliyah Northrop R Zaelyn Noack, [name]   THERAPIST PROGRESS NOTE  Session Time: 1-2p  Participation Level: Active  Behavioral Response: Neat and Well GroomedAlertDepressed  Type of Therapy: Individual Therapy  Treatment Goals addressed: Problem: Decrease depressive symptoms and improve levels of effective functioning Goal: LTG: Reduce frequency, intensity, and duration of depression symptoms as evidenced by: pt self report Description: Pt reports mood stable--feels less depression symptoms than at last session Outcome: Progressing Goal: STG: Jamelah WILL PARTICIPATE IN AT LEAST 80% OF SCHEDULED INDIVIDUAL PSYCHOTHERAPY SESSIONS Description: Pt on time for appt Outcome: Progressing Intervention: Monitor coping skills and behavior Note: Reviewed  Intervention: REVIEW PLEASE SKILLS (TREAT PHYSICAL ILLNESS, BALANCE EATING, AVOID MOOD-ALTERING SUBSTANCES, BALANCE SLEEP AND GET EXERCISE) WITH [name] Note: Reviewed  Intervention: Encourage family support Note: Reviewed       ProgressTowards Goals: Progressing  Interventions: CBT  Summary: ROZINA POINTER is a 56 y.o. female who presents with improving symptoms related to depression.. Pt reports mood stable and feels good about exercising on a regular basis.  Allowed pt to explore and express thoughts and feelings associated with recent life situations and external stressors. Discussed pts ongoing family-based conflicts/concerns. Pt still feels that she is not as "close" with her daughter as she wants to be. Allowed pt to explore her own expectations about what she wants the relationship with her daughter and examined what the reality of that relationship is. Discussed steps that could be taken to make changes.  Pt is happy with current relationship w/ mother. "She is listening to me more and including me more". Pt states that on one hand she gets upset when her mother treats her like a child, and on the other hand pt gets upset when she is not included in conversations, outings, dinners, etc.   Pt reports that she is continuing to focus on her overall wellness and is compliant with medication, exercising on a regular basis, and is making healthy nutritional choices.   Continued recommendations are as follows: self care behaviors, positive social engagements, focusing on overall work/home/life balance, and focusing on positive physical and emotional wellness.   Suicidal/Homicidal: No  Therapist Response: Pt is continuing to apply interventions learned in session into daily life situations. Pt is currently on track to meet goals utilizing interventions mentioned above. Personal growth and progress noted. Treatment to continue as indicated.    Plan: Return again in 4 weeks.  Diagnosis:  Encounter Diagnoses  Name Primary?   MDD (major depressive disorder), recurrent, in partial remission ([place]) Yes   GAD (generalized anxiety disorder)     Collaboration of Care: Other pt encouraged to  continue care with  psychiatrist of record, Dr.Eappen  Patient/Guardian was advised Release of Information must be obtained prior to any record release in order to collaborate their care with an outside provider. Patient/Guardian was advised if they have not already done so to contact the registration department to sign all necessary forms in order for [place] to release information regarding their care.   Consent: Patient/Guardian gives verbal consent for treatment and assignment of benefits for services provided during this visit. Patient/Guardian expressed understanding and agreed to proceed.   [place], [name] 04/07/2022

## 2022-04-08 NOTE — Plan of Care (Signed)
  Problem: Decrease depressive symptoms and improve levels of effective functioning Goal: LTG: Reduce frequency, intensity, and duration of depression symptoms as evidenced by: pt self report Description: Pt reports mood stable--feels less depression symptoms than at last session Outcome: Progressing Goal: STG: Wai WILL PARTICIPATE IN AT LEAST 80% OF SCHEDULED INDIVIDUAL PSYCHOTHERAPY SESSIONS Description: Pt on time for appt Outcome: Progressing Intervention: Monitor coping skills and behavior Note: Reviewed  Intervention: REVIEW PLEASE SKILLS (TREAT PHYSICAL ILLNESS, BALANCE EATING, AVOID MOOD-ALTERING SUBSTANCES, BALANCE SLEEP AND GET EXERCISE) WITH [name] Note: Reviewed  Intervention: Encourage family support Note: Reviewed

## 2022-05-20 ENCOUNTER — Ambulatory Visit (INDEPENDENT_AMBULATORY_CARE_PROVIDER_SITE_OTHER): Payer: Medicare Other | Admitting: Licensed Clinical Social Worker

## 2022-05-20 DIAGNOSIS — F411 Generalized anxiety disorder: Secondary | ICD-10-CM

## 2022-05-20 DIAGNOSIS — F3341 Major depressive disorder, recurrent, in partial remission: Secondary | ICD-10-CM | POA: Diagnosis not present

## 2022-05-20 NOTE — Plan of Care (Signed)
  Problem: Decrease depressive symptoms and improve levels of effective functioning Goal: LTG: Reduce frequency, intensity, and duration of depression symptoms as evidenced by: pt self report Description: Pt reports mood stable--feels less depression symptoms than at last session Outcome: Progressing Goal: STG: Sande WILL PARTICIPATE IN AT LEAST 80% OF SCHEDULED INDIVIDUAL PSYCHOTHERAPY SESSIONS Description: Pt on time for appt Outcome: Progressing

## 2022-05-20 NOTE — Progress Notes (Signed)
Virtual Visit via Video Note  I connected with Cindy Mcclure on 05/20/22 at  2:00 PM EDT by a video enabled telemedicine application and verified that I am speaking with the correct person using two identifiers.  Location: Patient: home Provider: remote office [place], [place])   I discussed the limitations of evaluation and management by telemedicine and the availability of in person appointments. The patient expressed understanding and agreed to proceed.   I discussed the assessment and treatment plan with the patient. The patient was provided an opportunity to ask questions and all were answered. The patient agreed with the plan and demonstrated an understanding of the instructions.   The patient was advised to call back or seek an in-person evaluation if the symptoms worsen or if the condition fails to improve as anticipated.  I provided 60 minutes of non-face-to-face time during this encounter.   Cindy [place] R Camya Haydon, [name]   THERAPIST PROGRESS NOTE  Session Time: 1-2p  Participation Level: Active  Behavioral Response: Neat and Well GroomedAlertDepressed  Type of Therapy: Individual Therapy  Treatment Goals addressed:   Problem: Decrease depressive symptoms and improve levels of effective functioning Goal: LTG: Reduce frequency, intensity, and duration of depression symptoms as evidenced by: pt self report Description: Pt reports mood stable--feels less depression symptoms than at last session Outcome: Progressing Goal: STG: [name] WILL PARTICIPATE IN AT LEAST 80% OF SCHEDULED INDIVIDUAL PSYCHOTHERAPY SESSIONS Description: Pt on time for appt Outcome: Progressing  ProgressTowards Goals: Progressing  Interventions: CBT  Summary: Cindy Mcclure is a 56 y.o. female who presents with improving symptoms related to depression.. Pt reports mood stable and feels good about exercising on a regular basis. Pt feels like she is managing depression and anxiety symptoms well.    Explored relationships with daughters and mother. Pt feels that her relationship with daughters are strained--pts relationship with son going well.   Reviewed coping skills for managing depression symptoms and anxiety symptoms. Pt reflects understanding.   Pt reports that she is continuing to focus on her overall wellness and is compliant with medication, exercising on a regular basis, and is making healthy nutritional choices.   Continued recommendations are as follows: self care behaviors, positive social engagements, focusing on overall work/home/life balance, and focusing on positive physical and emotional wellness.   Suicidal/Homicidal: No  Therapist Response: Pt is continuing to apply interventions learned in session into daily life situations. Pt is currently on track to meet goals utilizing interventions mentioned above. Personal growth and progress noted. Treatment to continue as indicated.   Plan: Return again PRN.  Diagnosis:  Encounter Diagnoses  Name Primary?   MDD (major depressive disorder), recurrent, in partial remission ([place]) Yes   GAD (generalized anxiety disorder)    Collaboration of Care: Other pt encouraged to continue care with psychiatrist of record, Dr.Eappen  Patient/Guardian was advised Release of Information must be obtained prior to any record release in order to collaborate their care with an outside provider. Patient/Guardian was advised if they have not already done so to contact the registration department to sign all necessary forms in order for [place] to release information regarding their care.   Consent: Patient/Guardian gives verbal consent for treatment and assignment of benefits for services provided during this visit. Patient/Guardian expressed understanding and agreed to proceed.   [place], [name] 05/20/2022

## 2023-02-03 ENCOUNTER — Ambulatory Visit: Payer: 59 | Admitting: Cardiovascular Disease

## 2023-02-24 ENCOUNTER — Other Ambulatory Visit: Payer: Self-pay | Admitting: Family Medicine

## 2023-02-24 DIAGNOSIS — Z1231 Encounter for screening mammogram for malignant neoplasm of breast: Secondary | ICD-10-CM

## 2023-03-31 ENCOUNTER — Ambulatory Visit
Admission: RE | Admit: 2023-03-31 | Discharge: 2023-03-31 | Disposition: A | Discharge status: Home / Self Care | Payer: 59 | Source: Ambulatory Visit | Attending: Family Medicine | Admitting: Family Medicine

## 2023-03-31 DIAGNOSIS — Z1231 Encounter for screening mammogram for malignant neoplasm of breast: Secondary | ICD-10-CM | POA: Diagnosis not present

## 2023-04-09 ENCOUNTER — Ambulatory Visit: Payer: 59 | Attending: Cardiovascular Disease | Admitting: Cardiology

## 2023-04-09 ENCOUNTER — Encounter: Payer: Self-pay | Admitting: Cardiology

## 2023-04-09 VITALS — BP 98/68 | HR 71 | Ht 59.0 in | Wt 138.4 lb

## 2023-04-09 DIAGNOSIS — I340 Nonrheumatic mitral (valve) insufficiency: Secondary | ICD-10-CM | POA: Diagnosis not present

## 2023-04-09 DIAGNOSIS — R072 Precordial pain: Secondary | ICD-10-CM

## 2023-04-09 NOTE — Progress Notes (Signed)
Cardiology Office Note:    Date:  04/09/2023   ID:  Cindy Mcclure, DOB 11/07/1965, MRN [id number]  PCP:  [facility]   [facility] Providers Cardiologist:  Debbe Odea, MD     Referring MD: Debbe Odea, MD   Chief Complaint  Patient presents with   New Patient (Initial Visit)    Referral for cardiac evaluation for mid chest pain.  Reports having smilimar chest pains since 1999 but become more severe lately.  Cardiac history with [facility] cardiology.      History of Present Illness:    Cindy Mcclure is a 57 y.o. female with a hx of mitral regurgitation, anxiety, GERD, panic attack, presenting with chest pain.  States having chest pain located in the epigastrium ongoing over the past 20 years.  Symptoms are not associated with exertion.  Also reports having reflux, currently takes Protonix for this, does not seem to be helping.  Previously seen by [facility] cardiology from a cardiac perspective.  Evaluated previously by [facility] cardiology with symptoms of right-sided chest pain deemed musculoskeletal in etiology.  Outside echo 2019 EF over 55%, mild to moderate MR.  Past Medical History:  Diagnosis Date   Allergic rhinitis    Anginal pain [facility])    sees dr Juliann Pares   Anxiety    Arthritis    all over...knees, ankles, back   Back pain    Cervical radiculitis    no surgery, just cortisone shots.     Constipation    Constipation    DDD (degenerative disc disease), cervical    Depression    Dermatophytosis of nail 2019   both feet   GERD (gastroesophageal reflux disease)    History of kidney stones    h/o   Nonrheumatic mitral valve regurgitation    Osteoarthritis    Panic attacks    Plantar fasciitis    bilaterally   Seizures (HCC) 1972   unsure of when she had last seizure due to petit mal type. does not know what brings on a seizure.   Stress bladder incontinence, female    Urinary incontinence     Past Surgical History:   Procedure Laterality Date   APPENDECTOMY     CESAREAN SECTION  [phone]   CHOLECYSTECTOMY N/A 12/06/2015   Procedure: LAPAROSCOPIC CHOLECYSTECTOMY WITH INTRAOPERATIVE CHOLANGIOGRAM;  Surgeon: Tiney Rouge III, MD;  Location: [facility] ORS;  Service: General;  Laterality: N/A;   COLONOSCOPY WITH PROPOFOL N/A 09/29/2016   Procedure: COLONOSCOPY WITH PROPOFOL;  Surgeon: Scot Jun, MD;  Location: [facility] ENDOSCOPY;  Service: Endoscopy;  Laterality: N/A;   EXTERNAL FIXATION LEG Left 12/22/2019   Procedure: External fixation left spanning knee. CPT 20690 uniplane;  Surgeon: Tarry Kos, MD;  Location: [facility] OR;  Service: Orthopedics;  Laterality: Left;   EXTERNAL FIXATION REMOVAL Left 12/26/2019   Procedure: Removal External Fixation Leg;  Surgeon: Roby Lofts, MD;  Location: [facility] OR;  Service: Orthopedics;  Laterality: Left;   HARDWARE REMOVAL Left 05/21/2020   Procedure: HARDWARE REMOVAL PROXIMAL TIBIA;  Surgeon: Roby Lofts, MD;  Location: [facility] OR;  Service: Orthopedics;  Laterality: Left;   KNEE ARTHROSCOPY WITH EXCISION PLICA Left 11/27/2016   Procedure: KNEE ARTHROSCOPY WITH EXCISION PLICA, PARTIAL SYNOVECTOMY;  Surgeon: Kennedy Bucker, MD;  Location: [facility] ORS;  Service: Orthopedics;  Laterality: Left;   KNEE ARTHROSCOPY WITH MEDIAL MENISECTOMY Right 09/03/2017   Procedure: KNEE ARTHROSCOPY WITH MEDIAL MENISECTOMY, PARTIAL SYNOVECTOMY;  Surgeon: Kennedy Bucker, MD;  Location:  [facility] ORS;  Service: Orthopedics;  Laterality: Right;   ORIF CALCANEOUS FRACTURE Right 12/26/2019   Procedure: OPEN REDUCTION INTERNAL FIXATION (ORIF) CALCANEOUS FRACTURE;  Surgeon: Roby Lofts, MD;  Location: [facility] OR;  Service: Orthopedics;  Laterality: Right;   ORIF TIBIA PLATEAU Left 12/26/2019   Procedure: OPEN REDUCTION INTERNAL FIXATION (ORIF) TIBIAL PLATEAU;  Surgeon: Roby Lofts, MD;  Location: [facility] OR;  Service: Orthopedics;  Laterality: Left;   SHOULDER ARTHROSCOPY WITH DEBRIDEMENT AND BICEP TENDON REPAIR Left 03/22/2019    Procedure: SHOULDER ARTHROSCOPY WITH DEBRIDEMENT, ROTATOR CUFF REPAIR, AND BICEP TENDON REPAIR;  Surgeon: Christena Flake, MD;  Location: [facility] ORS;  Service: Orthopedics;  Laterality: Left;   SHOULDER ARTHROSCOPY WITH SUBACROMIAL DECOMPRESSION AND OPEN ROTATOR C Right 11/13/2020   Procedure: SHOULDER ARTHROSCOPY WITH DEBRIDEMENT, DECOMPRESSION, POSSIBLE ROTATOR CUFF REPAIR, AND POSSIBLE  BICEP TENODESIS.;  Surgeon: Christena Flake, MD;  Location: [facility] ORS;  Service: Orthopedics;  Laterality: Right;   SUBACROMIAL DECOMPRESSION  03/22/2019   Procedure: SUBACROMIAL DECOMPRESSION;  Surgeon: Christena Flake, MD;  Location: [facility] ORS;  Service: Orthopedics;;   SURGERY FOR SEIZURES     nothing implanted in head. she was 57 years old. done at [facility]   TONSILLECTOMY     TUBAL LIGATION  2003    Current Medications: Current Meds  Medication Sig   Calcium Carb-Cholecalciferol (CALCIUM 600 + D PO) Take 1 tablet by mouth daily.   cloBAZam (ONFI) 10 MG tablet Take 30 mg by mouth daily with supper.   lamoTRIgine (LAMICTAL) 150 MG tablet Take 150 mg by mouth 2 (two) times daily.    Levetiracetam 750 MG TB24 Take 750 mg by mouth 2 (two) times daily.   Multiple Vitamins-Minerals (CENTRUM ADULTS PO) Take 1 tablet by mouth daily.    pantoprazole (PROTONIX) 40 MG tablet Take 40 mg by mouth daily.   pyridoxine (B-6) 100 MG tablet Take 100 mg by mouth daily.   Vitamin A 2400 MCG (8000 UT) CAPS Take 8,000 Units by mouth daily.    vitamin B-12 (CYANOCOBALAMIN) 500 MCG tablet Take 500 mcg by mouth daily.    vitamin C (ASCORBIC ACID) 500 MG tablet Take 500 mg by mouth daily.   Vitamin E 180 MG (400 UNIT) CAPS Take 400 Units by mouth daily.   zonisamide (ZONEGRAN) 100 MG capsule Take 400 mg by mouth daily with supper.     Allergies:   Patient has no known allergies.   Social History   Socioeconomic History   Marital status: Divorced    Spouse name: Not on file   Number of children: Not on file   Years of education: Not  on file   Highest education level: Not on file  Occupational History   Not on file  Tobacco Use   Smoking status: Former    Current packs/day: 0.00    Types: Cigarettes    Quit date: 08/17/2012    Years since quitting: 10.6   Smokeless tobacco: Never   Tobacco comments:    pt states she smoked intermittently-smokers in home  Vaping Use   Vaping status: Never Used  Substance and Sexual Activity   Alcohol use: Yes    Comment: only on special occasions    Drug use: No    Comment: denies   Sexual activity: Not Currently  Other Topics Concern   Not on file  Social History Narrative   Not on file   Social Determinants of Health   Financial Resource Strain: Not on file  Food Insecurity: Not on file  Transportation Needs: Not on file  Physical Activity: Not on file  Stress: Not on file  Social Connections: Not on file     Family History: The patient's family history includes Anxiety disorder in her daughter; Heart attack in her father. There is no history of Breast cancer.  ROS:   Please see the history of present illness.     All other systems reviewed and are negative.  EKGs/Labs/Other Studies Reviewed:    The following studies were reviewed today:  EKG Interpretation Date/Time:  Thursday April 09 2023 11:57:28 EDT Ventricular Rate:  71 PR Interval:  146 QRS Duration:  68 QT Interval:  386 QTC Calculation: 419 R Axis:   37  Text Interpretation: Normal sinus rhythm Normal ECG Confirmed by Debbe Odea ([id number]) on 04/09/2023 11:59:40 AM    Recent Labs: No results found for requested labs within last 365 days.  Recent Lipid Panel No results found for: "CHOL", "TRIG", "HDL", "CHOLHDL", "VLDL", "LDLCALC", "LDLDIRECT"   Risk Assessment/Calculations:             Physical Exam:    VS:  BP 98/68 (BP Location: Left Arm, Patient Position: Sitting, Cuff Size: Normal)   Pulse 71   Ht 4\' 11"  (1.499 m)   Wt 138 lb 6.4 oz (62.8 kg)   SpO2 99%   BMI 27.95  kg/m     Wt Readings from Last 3 Encounters:  04/09/23 138 lb 6.4 oz (62.8 kg)  11/06/20 179 lb (81.2 kg)  05/21/20 169 lb (76.7 kg)     GEN:  Well nourished, well developed in no acute distress HEENT: Normal NECK: No JVD; No carotid bruits CARDIAC: RRR, no murmurs, rubs, gallops RESPIRATORY:  Clear to auscultation without rales, wheezing or rhonchi  ABDOMEN: Soft, non-tender, non-distended MUSCULOSKELETAL:  No edema; No deformity  SKIN: Warm and dry NEUROLOGIC:  Alert and oriented x 3 PSYCHIATRIC:  Normal affect   ASSESSMENT:    1. Precordial pain   2. Nonrheumatic mitral valve regurgitation    PLAN:    In order of problems listed above:  Chest pain,  Obtain Lexiscan Myoview to rule out ischemia, obtain echo. BP low normal, avoiding coronary CTA.  If cardiac workup unrevealing, recommend GI workup per PCP due to history of GERD. History of mild MR, echo as above to evaluate any valvular disease progression.  Follow-up after echocardiogram and Lexiscan Myoview     Informed Consent   Shared Decision Making/Informed Consent The risks [chest pain, shortness of breath, cardiac arrhythmias, dizziness, blood pressure fluctuations, myocardial infarction, stroke/transient ischemic attack, nausea, vomiting, allergic reaction, radiation exposure, metallic taste sensation and life-threatening complications (estimated to be 1 in 10,000)], benefits (risk stratification, diagnosing coronary artery disease, treatment guidance) and alternatives of a nuclear stress test were discussed in detail with Ms. Labriola and she agrees to proceed.       Medication Adjustments/Labs and Tests Ordered: Current medicines are reviewed at length with the patient today.  Concerns regarding medicines are outlined above.  Orders Placed This Encounter  Procedures   NM Myocar Multi W/Spect W/Wall Motion / EF   EKG 12-Lead   ECHOCARDIOGRAM COMPLETE   No orders of the defined types were placed in this  encounter.   Patient Instructions  Medication Instructions:   Your physician recommends that you continue on your current medications as directed. Please refer to the Current Medication list given to you today.  *If you need a refill on your cardiac  medications before your next appointment, please call your pharmacy*   Lab Work:  None Ordered  If you have labs (blood work) drawn today and your tests are completely normal, you will receive your results only by: MyChart Message (if you have MyChart) OR A paper copy in the mail If you have any lab test that is abnormal or we need to change your treatment, we will call you to review the results.   Testing/Procedures:  Your physician has requested that you have an echocardiogram. Echocardiography is a painless test that uses sound waves to create images of your heart. It provides your doctor with information about the size and shape of your heart and how well your heart's chambers and valves are working. This procedure takes approximately one hour. There are no restrictions for this procedure. Please do NOT wear cologne, perfume, aftershave, or lotions (deodorant is allowed). Please arrive 15 minutes prior to your appointment time.  [facility] MYOVIEW  Your Provider has ordered a Stress Test with nuclear imaging. The purpose of this test is to evaluate the blood supply to your heart muscle. This procedure is referred to as a "Non-Invasive Stress Test." This is because other than having an IV started in your vein, nothing is inserted or "invades" your body. Cardiac stress tests are done to find areas of poor blood flow to the heart by determining the extent of coronary artery disease (CAD). Some patients exercise on a treadmill, which naturally increases the blood flow to your heart, while others who are unable to walk on a treadmill due to physical limitations have a pharmacologic/chemical stress agent called Lexiscan. This medicine will mimic  walking on a treadmill by temporarily increasing your coronary blood flow.     REPORT TO [facility] MEDICAL MALL ENTRANCE  **Proceed to the 1st desk on the right, REGISTRATION, to check in**  Please note: this test may take anywhere between 2-4 hours to complete    Instructions regarding medication:   _XXXXX__:   You may take all of your regular morning medications the day of your test unless listed below.  =  How to prepare for your Myoview test:  Do not eat or drink for 6 hours prior to the test No caffeine for 24 hours prior to the test No smoking 24 hours prior to the test. Ladies, please do not wear dresses.  Skirts or pants are appropriate. Please wear a short sleeve shirt. No perfume, cologne or lotion. Wear comfortable walking shoes. No heels!   PLEASE NOTIFY THE OFFICE AT LEAST 24 HOURS IN ADVANCE IF YOU ARE UNABLE TO KEEP YOUR APPOINTMENT.  [phone] AND  PLEASE NOTIFY NUCLEAR MEDICINE AT [facility] AT LEAST 24 HOURS IN ADVANCE IF YOU ARE UNABLE TO KEEP YOUR APPOINTMENT. [phone]      Follow-Up: At [facility], you and your health needs are our priority.  As part of our continuing mission to provide you with exceptional heart care, we have created designated Provider Care Teams.  These Care Teams include your primary Cardiologist (physician) and Advanced Practice Providers (APPs -  Physician Assistants and Nurse Practitioners) who all work together to provide you with the care you need, when you need it.  We recommend signing up for the patient portal called "MyChart".  Sign up information is provided on this After Visit Summary.  MyChart is used to connect with patients for Virtual Visits (Telemedicine).  Patients are able to view lab/test results, encounter notes, upcoming appointments, etc.  Non-urgent messages can  be sent to your provider as well.   To learn more about what you can do with MyChart, go to [email or web address].    Your next appointment:     After Testing  Provider:   You may see Debbe Odea, MD or one of the following Advanced Practice Providers on your designated Care Team:   Nicolasa Ducking, NP Eula Listen, PA-C Cadence Fransico Michael, PA-C Charlsie Quest, NP   Signed, Debbe Odea, MD  04/09/2023 12:33 PM    [facility]

## 2023-04-09 NOTE — Patient Instructions (Signed)
Medication Instructions:   Your physician recommends that you continue on your current medications as directed. Please refer to the Current Medication list given to you today.  *If you need a refill on your cardiac medications before your next appointment, please call your pharmacy*   Lab Work:  None Ordered  If you have labs (blood work) drawn today and your tests are completely normal, you will receive your results only by: MyChart Message (if you have MyChart) OR A paper copy in the mail If you have any lab test that is abnormal or we need to change your treatment, we will call you to review the results.   Testing/Procedures:  Your physician has requested that you have an echocardiogram. Echocardiography is a painless test that uses sound waves to create images of your heart. It provides your doctor with information about the size and shape of your heart and how well your heart's chambers and valves are working. This procedure takes approximately one hour. There are no restrictions for this procedure. Please do NOT wear cologne, perfume, aftershave, or lotions (deodorant is allowed). Please arrive 15 minutes prior to your appointment time.  [facility] MYOVIEW  Your Provider has ordered a Stress Test with nuclear imaging. The purpose of this test is to evaluate the blood supply to your heart muscle. This procedure is referred to as a "Non-Invasive Stress Test." This is because other than having an IV started in your vein, nothing is inserted or "invades" your body. Cardiac stress tests are done to find areas of poor blood flow to the heart by determining the extent of coronary artery disease (CAD). Some patients exercise on a treadmill, which naturally increases the blood flow to your heart, while others who are unable to walk on a treadmill due to physical limitations have a pharmacologic/chemical stress agent called Lexiscan. This medicine will mimic walking on a treadmill by temporarily  increasing your coronary blood flow.     REPORT TO [facility] MEDICAL MALL ENTRANCE  **Proceed to the 1st desk on the right, REGISTRATION, to check in**  Please note: this test may take anywhere between 2-4 hours to complete    Instructions regarding medication:   _XXXXX__:   You may take all of your regular morning medications the day of your test unless listed below.  =  How to prepare for your Myoview test:  Do not eat or drink for 6 hours prior to the test No caffeine for 24 hours prior to the test No smoking 24 hours prior to the test. Ladies, please do not wear dresses.  Skirts or pants are appropriate. Please wear a short sleeve shirt. No perfume, cologne or lotion. Wear comfortable walking shoes. No heels!   PLEASE NOTIFY THE OFFICE AT LEAST 24 HOURS IN ADVANCE IF YOU ARE UNABLE TO KEEP YOUR APPOINTMENT.  [phone] AND  PLEASE NOTIFY NUCLEAR MEDICINE AT [facility] AT LEAST 24 HOURS IN ADVANCE IF YOU ARE UNABLE TO KEEP YOUR APPOINTMENT. [phone]      Follow-Up: At [facility], you and your health needs are our priority.  As part of our continuing mission to provide you with exceptional heart care, we have created designated Provider Care Teams.  These Care Teams include your primary Cardiologist (physician) and Advanced Practice Providers (APPs -  Physician Assistants and Nurse Practitioners) who all work together to provide you with the care you need, when you need it.  We recommend signing up for the patient portal called "MyChart".  Sign up information  is provided on this After Visit Summary.  MyChart is used to connect with patients for Virtual Visits (Telemedicine).  Patients are able to view lab/test results, encounter notes, upcoming appointments, etc.  Non-urgent messages can be sent to your provider as well.   To learn more about what you can do with MyChart, go to [email or web address].    Your next appointment:    After Testing  Provider:   You may  see Debbe Odea, MD or one of the following Advanced Practice Providers on your designated Care Team:   Nicolasa Ducking, NP Eula Listen, PA-C Cadence Fransico Michael, PA-C Charlsie Quest, NP

## 2023-04-14 ENCOUNTER — Encounter
Admission: RE | Admit: 2023-04-14 | Discharge: 2023-04-14 | Disposition: A | Discharge status: Home / Self Care | Payer: 59 | Source: Ambulatory Visit | Attending: Cardiology | Admitting: Cardiology

## 2023-04-14 DIAGNOSIS — R072 Precordial pain: Secondary | ICD-10-CM | POA: Diagnosis present

## 2023-04-14 LAB — NM MYOCAR MULTI W/SPECT W/WALL MOTION / EF
LV dias vol: 41 mL (ref 46–106)
LV sys vol: 13 mL
Nuc Stress EF: 68 %
Peak HR: 109 {beats}/min
Percent HR: 67 %
Rest HR: 67 {beats}/min
Rest Nuclear Isotope Dose: 10 mCi
SDS: 0
SRS: 4
SSS: 3
ST Depression (mm): 0 mm
Stress Nuclear Isotope Dose: 30.1 mCi
TID: 0.81

## 2023-04-14 MED ORDER — TECHNETIUM TC 99M TETROFOSMIN IV KIT
30.0000 | PACK | Freq: Once | INTRAVENOUS | Status: AC
  Administered 2023-04-14: 30.08 via INTRAVENOUS

## 2023-04-14 MED ORDER — TECHNETIUM TC 99M TETROFOSMIN IV KIT
10.0000 | PACK | Freq: Once | INTRAVENOUS | Status: AC
  Administered 2023-04-14: 9.96 via INTRAVENOUS

## 2023-04-14 MED ORDER — REGADENOSON 0.4 MG/5ML IV SOLN
0.4000 mg | Freq: Once | INTRAVENOUS | Status: AC
  Administered 2023-04-14: 0.4 mg via INTRAVENOUS

## 2023-05-01 ENCOUNTER — Ambulatory Visit: Payer: 59 | Attending: Cardiology

## 2023-05-07 ENCOUNTER — Ambulatory Visit: Payer: 59 | Admitting: Cardiology

## 2023-05-25 ENCOUNTER — Ambulatory Visit: Payer: 59 | Attending: Cardiology

## 2023-05-25 DIAGNOSIS — R072 Precordial pain: Secondary | ICD-10-CM | POA: Diagnosis not present

## 2023-05-25 LAB — ECHOCARDIOGRAM COMPLETE
Area-P 1/2: 4.89 cm2
S' Lateral: 2.5 cm

## 2023-06-03 ENCOUNTER — Encounter: Payer: Self-pay | Admitting: Nurse Practitioner

## 2023-06-03 ENCOUNTER — Ambulatory Visit: Payer: 59 | Attending: Cardiology | Admitting: Nurse Practitioner

## 2023-06-03 VITALS — BP 112/68 | HR 72 | Ht 59.0 in | Wt 141.2 lb

## 2023-06-03 DIAGNOSIS — R072 Precordial pain: Secondary | ICD-10-CM | POA: Diagnosis not present

## 2023-06-03 DIAGNOSIS — I340 Nonrheumatic mitral (valve) insufficiency: Secondary | ICD-10-CM

## 2023-06-03 NOTE — Patient Instructions (Signed)
Medication Instructions:  None ordered *If you need a refill on your cardiac medications before your next appointment, please call your pharmacy*   Lab Work: None ordered If you have labs (blood work) drawn today and your tests are completely normal, you will receive your results only by: MyChart Message (if you have MyChart) OR A paper copy in the mail If you have any lab test that is abnormal or we need to change your treatment, we will call you to review the results.   Testing/Procedures: None ordered   Follow-Up: At [facility], you and your health needs are our priority.  As part of our continuing mission to provide you with exceptional heart care, we have created designated Provider Care Teams.  These Care Teams include your primary Cardiologist (physician) and Advanced Practice Providers (APPs -  Physician Assistants and Nurse Practitioners) who all work together to provide you with the care you need, when you need it.  We recommend signing up for the patient portal called "MyChart".  Sign up information is provided on this After Visit Summary.  MyChart is used to connect with patients for Virtual Visits (Telemedicine).  Patients are able to view lab/test results, encounter notes, upcoming appointments, etc.  Non-urgent messages can be sent to your provider as well.   To learn more about what you can do with MyChart, go to [email or web address].    Your next appointment:   6 month(s)  Provider:   You may see Debbe Odea, MD or one of the following Advanced Practice Providers on your designated Care Team:   Nicolasa Ducking, NP

## 2023-06-03 NOTE — Progress Notes (Signed)
Office Visit    Patient Name: Cindy Mcclure Date of Encounter: 06/03/2023  Primary Care Provider:  [facility] Primary Cardiologist:  Debbe Odea, MD  Chief Complaint    57 y.o. female with a history of precordial chest pain, mitral regurgitation, anxiety, depression, panic attacks, seizure disorder, and GERD, who presents for follow-up after recent testing related to precordial chest pain.  Past Medical History  Subjective   Past Medical History:  Diagnosis Date   Allergic rhinitis    Anginal pain [facility])    sees dr Juliann Pares   Anxiety    Arthritis    all over...knees, ankles, back   Back pain    Cervical radiculitis    no surgery, just cortisone shots.     Constipation    Constipation    DDD (degenerative disc disease), cervical    Depression    Dermatophytosis of nail 2019   both feet   GERD (gastroesophageal reflux disease)    History of kidney stones    h/o   Mitral regurgitation    a. 03/2018 Echo: EF>55%, mild-mod MR, mild TR; b. 05/2023 Echo: EF 55-60%, no rwma, nl RV size/fxn, mild MR, triv AI.   Nonrheumatic mitral valve regurgitation    Osteoarthritis    Panic attacks    Plantar fasciitis    bilaterally   Precordial chest pain    a. 03/2018 MV: No isch/infarct, EF 76%; b. 03/2023 MV: EF >65%, no isch/scar. No signif cor Ca2+.   Seizures (HCC) 1972   unsure of when she had last seizure due to petit mal type. does not know what brings on a seizure.   Stress bladder incontinence, female    Urinary incontinence    Past Surgical History:  Procedure Laterality Date   APPENDECTOMY     CESAREAN SECTION  [phone]   CHOLECYSTECTOMY N/A 12/06/2015   Procedure: LAPAROSCOPIC CHOLECYSTECTOMY WITH INTRAOPERATIVE CHOLANGIOGRAM;  Surgeon: Tiney Rouge III, MD;  Location: [facility] ORS;  Service: General;  Laterality: N/A;   COLONOSCOPY WITH PROPOFOL N/A 09/29/2016   Procedure: COLONOSCOPY WITH PROPOFOL;  Surgeon: Scot Jun, MD;   Location: [facility] ENDOSCOPY;  Service: Endoscopy;  Laterality: N/A;   EXTERNAL FIXATION LEG Left 12/22/2019   Procedure: External fixation left spanning knee. CPT 20690 uniplane;  Surgeon: Tarry Kos, MD;  Location: [facility] OR;  Service: Orthopedics;  Laterality: Left;   EXTERNAL FIXATION REMOVAL Left 12/26/2019   Procedure: Removal External Fixation Leg;  Surgeon: Roby Lofts, MD;  [facility]: [facility] OR;  Service: Orthopedics;  Laterality: Left;   HARDWARE REMOVAL Left 05/21/2020   Procedure: HARDWARE REMOVAL PROXIMAL TIBIA;  Surgeon: Roby Lofts, MD;  Location: [facility] OR;  Service: Orthopedics;  Laterality: Left;   KNEE ARTHROSCOPY WITH EXCISION PLICA Left 11/27/2016   Procedure: KNEE ARTHROSCOPY WITH EXCISION PLICA, PARTIAL SYNOVECTOMY;  Surgeon: Kennedy Bucker, MD;  Location: [facility] ORS;  Service: Orthopedics;  Laterality: Left;   KNEE ARTHROSCOPY WITH MEDIAL MENISECTOMY Right 09/03/2017   Procedure: KNEE ARTHROSCOPY WITH MEDIAL MENISECTOMY, PARTIAL SYNOVECTOMY;  Surgeon: Kennedy Bucker, MD;  Location: [facility] ORS;  Service: Orthopedics;  Laterality: Right;   ORIF CALCANEOUS FRACTURE Right 12/26/2019   Procedure: OPEN REDUCTION INTERNAL FIXATION (ORIF) CALCANEOUS FRACTURE;  Surgeon: Roby Lofts, MD;  [facility];  Service: Orthopedics;  Laterality: Right;   ORIF TIBIA PLATEAU Left 12/26/2019   Procedure: OPEN REDUCTION INTERNAL FIXATION (ORIF) TIBIAL PLATEAU;  Surgeon: Roby Lofts, MD;  [facility] OR;  Service:  Orthopedics;  Laterality: Left;   SHOULDER ARTHROSCOPY WITH DEBRIDEMENT AND BICEP TENDON REPAIR Left 03/22/2019   Procedure: SHOULDER ARTHROSCOPY WITH DEBRIDEMENT, ROTATOR CUFF REPAIR, AND BICEP TENDON REPAIR;  Surgeon: Christena Flake, MD;  Location: [facility] ORS;  Service: Orthopedics;  Laterality: Left;   SHOULDER ARTHROSCOPY WITH SUBACROMIAL DECOMPRESSION AND OPEN ROTATOR C Right 11/13/2020   Procedure: SHOULDER ARTHROSCOPY WITH DEBRIDEMENT, DECOMPRESSION, POSSIBLE ROTATOR CUFF REPAIR, AND POSSIBLE   BICEP TENODESIS.;  Surgeon: Christena Flake, MD;  Location: [facility] ORS;  Service: Orthopedics;  Laterality: Right;   SUBACROMIAL DECOMPRESSION  03/22/2019   Procedure: SUBACROMIAL DECOMPRESSION;  Surgeon: Christena Flake, MD;  Location: [facility] ORS;  Service: Orthopedics;;   SURGERY FOR SEIZURES     nothing implanted in head. she was 57 years old. done at [facility]   TONSILLECTOMY     TUBAL LIGATION  2003    Allergies  No Known Allergies    History of Present Illness      57 y.o. y/o female with a history of precordial chest pain, mitral regurgitation, anxiety, depression, panic attacks, seizure disorder, and GERD.  She was previously followed by [facility] cardiology and underwent stress testing in August 2019, which showed no evidence of ischemia or infarct with normal LV function.  Echocardiogram at that time showed normal LV function with mild to moderate MR and mild TR.  She established with Dr. Azucena Cecil in August 2024, secondary to precordial chest and epigastric pain unrelated to exertion, which had been occurring intermittently over a 20-year period.  She underwent repeat stress testing, which again showed normal LV function without evidence of ischemia or scar.  Notably, no significant coronary calcium was noted either.  Echocardiogram showed normal LV and RV function with mild MR, and trivial AI.    Since her last visit, Cindy Mcclure has done reasonably well from a cardiac standpoint.  She notes that she is not routinely exercising though, she sometimes walks her dog.  She has not been experiencing any chest pain or dyspnea and says today, that she has not had any chest pain since at least May of this year.  We discussed her stress test and echocardiogram results in detail today.  We also discussed her diet, which is quite high in ultra processed foods.  She denies palpitations, PND, orthopnea, dizziness, syncope, or early satiety.  She sometimes notes swelling in her ankles and feet if she is standing for  long periods of time. Objective  Home Medications    Current Outpatient Medications  Medication Sig Dispense Refill   Calcium Carb-Cholecalciferol (CALCIUM 600 + D PO) Take 1 tablet by mouth daily.     cloBAZam (ONFI) 10 MG tablet Take 30 mg by mouth daily with supper.     lamoTRIgine (LAMICTAL) 150 MG tablet Take 150 mg by mouth 2 (two) times daily.      Levetiracetam 750 MG TB24 Take 750 mg by mouth 2 (two) times daily.     Multiple Vitamins-Minerals (CENTRUM ADULTS PO) Take 1 tablet by mouth daily.      pantoprazole (PROTONIX) 40 MG tablet Take 40 mg by mouth daily.     pyridoxine (B-6) 100 MG tablet Take 100 mg by mouth daily.     Vitamin A 2400 MCG (8000 UT) CAPS Take 8,000 Units by mouth daily.      vitamin B-12 (CYANOCOBALAMIN) 500 MCG tablet Take 500 mcg by mouth daily.      vitamin C (ASCORBIC ACID) 500 MG tablet Take 500 mg by  mouth daily.     Vitamin E 180 MG (400 UNIT) CAPS Take 400 Units by mouth daily.     zonisamide (ZONEGRAN) 100 MG capsule Take 400 mg by mouth daily with supper.     No current facility-administered medications for this visit.     Physical Exam    VS:  BP 112/68   Pulse 72   Ht 4\' 11"  (1.499 m)   Wt 141 lb 3.2 oz (64 kg)   SpO2 99%   BMI 28.52 kg/m  , BMI Body mass index is 28.52 kg/m.       GEN: Well nourished, well developed, in no acute distress. HEENT: normal. Neck: Supple, no JVD, carotid bruits, or masses. Cardiac: RRR, no murmurs, rubs, or gallops. No clubbing, cyanosis, edema.  Radials 2+/PT 2+ and equal bilaterally.  Respiratory:  Respirations regular and unlabored, clear to auscultation bilaterally. GI: Soft, nontender, nondistended, BS + x 4. MS: no deformity or atrophy. Skin: warm and dry, no rash. Neuro:  Strength and sensation are intact. Psych: Flat affect.  Accessory Clinical Findings    ECG personally reviewed by me today - EKG Interpretation Date/Time:  Wednesday June 03 2023 11:40:04 EDT Ventricular Rate:  72 PR  Interval:  152 QRS Duration:  74 QT Interval:  374 QTC Calculation: 409 R Axis:   61  Text Interpretation: Normal sinus rhythm Normal ECG Confirmed by Nicolasa Ducking [phone]) on 06/03/2023 11:51:59 AM  - no acute changes.  Lab Results  Component Value Date   WBC 8.2 02/19/2022   HGB 12.3 02/19/2022   HCT 38.2 02/19/2022   MCV 95.3 02/19/2022   PLT 275 02/19/2022   Lab Results  Component Value Date   CREATININE 1.13 (H) 02/19/2022   BUN 13 02/19/2022   NA 140 02/19/2022   K 3.7 02/19/2022   CL 110 02/19/2022   CO2 24 02/19/2022   Lab Results  Component Value Date   ALT 17 02/19/2022   AST 27 02/19/2022   ALKPHOS 73 02/19/2022   BILITOT 0.5 02/19/2022   Lab Results  Component Value Date   TSH 1.930 11/04/2018       Assessment & Plan    1.  Precordial chest pain: Patient complained of history of chest pain in August underwent stress testing, which was nonischemic.  No significant coronary calcium noted.  Discussed with patient in detail today.  She says today, that she has not been having chest pain since May 2024.  She remains on PPI therapy.  No further ischemic evaluation is warranted at this time.  2.  Mitral regurgitation: Prior echo in August 2019 reportedly showed mild to moderate mitral regurgitation.  Repeat echo earlier this month, which showed mild MR and trivial AI.  Discussed echocardiogram results in detail as well as indication for surveillance echocardiogram for mitral regurgitation in 3 to 5 years, or with symptom changes.  3.  Disposition: Follow-up in 6 months or sooner if necessary.  Nicolasa Ducking, NP 06/03/2023, 11:52 AM

## 2023-10-14 ENCOUNTER — Other Ambulatory Visit: Admission: RE | Admit: 2023-10-14 | Payer: 59 | Source: Ambulatory Visit

## 2023-10-14 ENCOUNTER — Ambulatory Visit
Admission: RE | Admit: 2023-10-14 | Discharge: 2023-10-14 | Disposition: A | Discharge status: Home / Self Care | Payer: 59 | Source: Ambulatory Visit | Attending: Family Medicine | Admitting: Family Medicine

## 2023-10-14 ENCOUNTER — Other Ambulatory Visit: Payer: Self-pay | Admitting: Family Medicine

## 2023-10-14 DIAGNOSIS — R0789 Other chest pain: Secondary | ICD-10-CM | POA: Diagnosis present

## 2023-12-02 ENCOUNTER — Ambulatory Visit: Payer: 59 | Admitting: Nurse Practitioner

## 2023-12-03 ENCOUNTER — Ambulatory Visit: Attending: Cardiology | Admitting: Cardiology

## 2023-12-03 VITALS — BP 110/60 | HR 70 | Ht 59.0 in | Wt 150.4 lb

## 2023-12-03 DIAGNOSIS — R079 Chest pain, unspecified: Secondary | ICD-10-CM

## 2023-12-03 DIAGNOSIS — I340 Nonrheumatic mitral (valve) insufficiency: Secondary | ICD-10-CM | POA: Diagnosis not present

## 2023-12-03 NOTE — Progress Notes (Signed)
 Cardiology Office Note:    Date:  12/03/2023   ID:  Cindy Mcclure, DOB [birth year], MRN [id number]  PCP:  [facility] Providers Cardiologist:  Constancia Delton, MD     Referring MD: [facility]   Chief Complaint  Patient presents with   6 month follow up    Patient c/o chest pain with difficulty breathing; symptoms are off & on with being at rest mostly.     History of Present Illness:    Cindy Mcclure is a 58 y.o. female with a hx of mild mitral regurgitation, anxiety, GERD, panic attack, presenting with for follow-up.  Previously seen with symptoms of chest pain, echo and Lexiscan Myoview was obtained to evaluate cardiac function.  Echo showed normal EF, mild mitral regurgitation.  Lexiscan Myoview showed no significant ischemia.  She states having occasional chest discomfort, last episode a month ago lasting 5 to 10 minutes.  Was previously on Protonix, stopped taking due to feeling nauseous.  Currently does not take any medicine for reflux.  Prior notes/testing Echo 10/24 EF 55 to 60%. Lexiscan Myoview 03/2023 no significant ischemia.,  Low risk study Outside echo 2019 EF over 55%, mild to moderate MR.  Past Medical History:  Diagnosis Date   Allergic rhinitis    Anginal pain [facility])    sees dr Beau Bound   Anxiety    Arthritis    all over...knees, ankles, back   Back pain    Cervical radiculitis    no surgery, just cortisone shots.     Constipation    Constipation    DDD (degenerative disc disease), cervical    Depression    Dermatophytosis of nail 2019   both feet   GERD (gastroesophageal reflux disease)    History of kidney stones    h/o   Mitral regurgitation    a. 03/2018 Echo: EF>55%, mild-mod MR, mild TR; b. 05/2023 Echo: EF 55-60%, no rwma, nl RV size/fxn, mild MR, triv AI.   Nonrheumatic mitral valve regurgitation    Osteoarthritis    Panic attacks    Plantar fasciitis    bilaterally    Precordial chest pain    a. 03/2018 MV: No isch/infarct, EF 76%; b. 03/2023 MV: EF >65%, no isch/scar. No signif cor Ca2+.   Seizures (HCC) 1972   unsure of when she had last seizure due to petit mal type. does not know what brings on a seizure.   Stress bladder incontinence, female    Urinary incontinence     Past Surgical History:  Procedure Laterality Date   APPENDECTOMY     CESAREAN SECTION  [id number]   CHOLECYSTECTOMY N/A 12/06/2015   Procedure: LAPAROSCOPIC CHOLECYSTECTOMY WITH INTRAOPERATIVE CHOLANGIOGRAM;  Surgeon: Rhina Center III, MD;  Location: [facility] ORS;  Service: General;  Laterality: N/A;   COLONOSCOPY WITH PROPOFOL N/A 09/29/2016   Procedure: COLONOSCOPY WITH PROPOFOL;  Surgeon: Cassie Click, MD;  Location: [facility] ENDOSCOPY;  Service: Endoscopy;  Laterality: N/A;   EXTERNAL FIXATION LEG Left 12/22/2019   Procedure: External fixation left spanning knee. CPT 20690 uniplane;  Surgeon: Wes Hamman, MD;  Location: [facility] OR;  Service: Orthopedics;  Laterality: Left;   EXTERNAL FIXATION REMOVAL Left 12/26/2019   Procedure: Removal External Fixation Leg;  Surgeon: Laneta Pintos, MD;  [facility] OR;  Service: Orthopedics;  Laterality: Left;   HARDWARE REMOVAL Left 05/21/2020   Procedure: HARDWARE REMOVAL PROXIMAL TIBIA;  Surgeon: Laneta Pintos, MD;  Location: [facility] OR;  Service: Orthopedics;  Laterality: Left;   KNEE ARTHROSCOPY WITH EXCISION PLICA Left 11/27/2016   Procedure: KNEE ARTHROSCOPY WITH EXCISION PLICA, PARTIAL SYNOVECTOMY;  Surgeon: Kennedy Bucker, MD;  Location: [facility] ORS;  Service: Orthopedics;  Laterality: Left;   KNEE ARTHROSCOPY WITH MEDIAL MENISECTOMY Right 09/03/2017   Procedure: KNEE ARTHROSCOPY WITH MEDIAL MENISECTOMY, PARTIAL SYNOVECTOMY;  Surgeon: Kennedy Bucker, MD;  Location: [facility] ORS;  Service: Orthopedics;  Laterality: Right;   ORIF CALCANEOUS FRACTURE Right 12/26/2019   Procedure: OPEN REDUCTION INTERNAL FIXATION (ORIF) CALCANEOUS FRACTURE;  Surgeon: Roby Lofts, MD;   Location: [facility] OR;  Service: Orthopedics;  Laterality: Right;   ORIF TIBIA PLATEAU Left 12/26/2019   Procedure: OPEN REDUCTION INTERNAL FIXATION (ORIF) TIBIAL PLATEAU;  Surgeon: Roby Lofts, MD;  Location: [facility] OR;  Service: Orthopedics;  Laterality: Left;   SHOULDER ARTHROSCOPY WITH DEBRIDEMENT AND BICEP TENDON REPAIR Left 03/22/2019   Procedure: SHOULDER ARTHROSCOPY WITH DEBRIDEMENT, ROTATOR CUFF REPAIR, AND BICEP TENDON REPAIR;  Surgeon: Christena Flake, MD;  Location: [facility] ORS;  Service: Orthopedics;  Laterality: Left;   SHOULDER ARTHROSCOPY WITH SUBACROMIAL DECOMPRESSION AND OPEN ROTATOR C Right 11/13/2020   Procedure: SHOULDER ARTHROSCOPY WITH DEBRIDEMENT, DECOMPRESSION, POSSIBLE ROTATOR CUFF REPAIR, AND POSSIBLE  BICEP TENODESIS.;  Surgeon: Christena Flake, MD;  Location: [facility] ORS;  Service: Orthopedics;  Laterality: Right;   SUBACROMIAL DECOMPRESSION  03/22/2019   Procedure: SUBACROMIAL DECOMPRESSION;  Surgeon: Christena Flake, MD;  Location: [facility] ORS;  Service: Orthopedics;;   SURGERY FOR SEIZURES     nothing implanted in head. she was 58 years old. done at [facility]   TONSILLECTOMY     TUBAL LIGATION  2003    Current Medications: Current Meds  Medication Sig   Calcium Carb-Cholecalciferol (CALCIUM 600 + D PO) Take 1 tablet by mouth daily.   cloBAZam (ONFI) 10 MG tablet Take 30 mg by mouth daily with supper.   lamoTRIgine (LAMICTAL) 150 MG tablet Take 150 mg by mouth 2 (two) times daily.    Levetiracetam 750 MG TB24 Take 750 mg by mouth 2 (two) times daily.   Multiple Vitamins-Minerals (CENTRUM ADULTS PO) Take 1 tablet by mouth daily.    pyridoxine (B-6) 100 MG tablet Take 100 mg by mouth daily.   Vitamin A 2400 MCG (8000 UT) CAPS Take 8,000 Units by mouth daily.    vitamin B-12 (CYANOCOBALAMIN) 500 MCG tablet Take 500 mcg by mouth daily.    vitamin C (ASCORBIC ACID) 500 MG tablet Take 500 mg by mouth daily.   Vitamin E 180 MG (400 UNIT) CAPS Take 400 Units by mouth daily.   zonisamide  (ZONEGRAN) 100 MG capsule Take 400 mg by mouth daily with supper.     Allergies:   Patient has no known allergies.   Social History   Socioeconomic History   Marital status: Divorced    Spouse name: Not on file   Number of children: Not on file   Years of education: Not on file   Highest education level: Not on file  Occupational History   Not on file  Tobacco Use   Smoking status: Former    Current packs/day: 0.00    Types: Cigarettes    Quit date: 08/17/2012    Years since quitting: 11.3   Smokeless tobacco: Never   Tobacco comments:    pt states she smoked intermittently-smokers in home  Vaping Use   Vaping status: Never Used  Substance and Sexual Activity   Alcohol use: Yes  Comment: only on special occasions    Drug use: No    Comment: denies   Sexual activity: Not Currently  Other Topics Concern   Not on file  Social History Narrative   Not on file   Social Drivers of Health   Financial Resource Strain: Low Risk  (07/27/2023)   Received from [facility]   Overall Financial Resource Strain (CARDIA)    Difficulty of Paying Living Expenses: Not hard at all  Food Insecurity: No Food Insecurity (07/27/2023)   Received from [facility]   Hunger Vital Sign    Worried About Running Out of Food in the Last Year: Never true    Ran Out of Food in the Last Year: Never true  Transportation Needs: Unknown (07/27/2023)   Received from [facility] - [facility]    In the past 12 months, has lack of transportation kept you from medical appointments or from getting medications?: No    Lack of Transportation (Non-Medical): Not on file  Physical Activity: Not on file  Stress: Not on file  Social Connections: Not on file     Family History: The patient's family history includes Anxiety disorder in her daughter; Heart attack in her father. There is no history of Breast cancer.  ROS:   Please see the  history of present illness.     All other systems reviewed and are negative.  EKGs/Labs/Other Studies Reviewed:    The following studies were reviewed today:  EKG Interpretation Date/Time:  Thursday December 03 2023 11:49:42 EDT Ventricular Rate:  70 PR Interval:  142 QRS Duration:  72 QT Interval:  376 QTC Calculation: 406 R Axis:   6  Text Interpretation: Normal sinus rhythm Normal ECG Confirmed by Debbe Odea ([id number]) on 12/03/2023 11:52:12 AM    Recent Labs: No results found for requested labs within last 365 days.  Recent Lipid Panel No results found for: "CHOL", "TRIG", "HDL", "CHOLHDL", "VLDL", "LDLCALC", "LDLDIRECT"   Risk Assessment/Calculations:             Physical Exam:    VS:  BP 110/60 (BP Location: Left Arm, Patient Position: Sitting, Cuff Size: Normal)   Pulse 70   Ht 4\' 11"  (1.499 m)   Wt 150 lb 6 oz (68.2 kg)   SpO2 98%   BMI 30.37 kg/m     Wt Readings from Last 3 Encounters:  12/03/23 150 lb 6 oz (68.2 kg)  06/03/23 141 lb 3.2 oz (64 kg)  04/09/23 138 lb 6.4 oz (62.8 kg)     GEN:  Well nourished, well developed in no acute distress HEENT: Normal NECK: No JVD; No carotid bruits CARDIAC: RRR, no murmurs, rubs, gallops RESPIRATORY:  Clear to auscultation without rales, wheezing or rhonchi  ABDOMEN: Soft, non-tender, non-distended MUSCULOSKELETAL:  No edema; No deformity  SKIN: Warm and dry NEUROLOGIC:  Alert and oriented x 3 PSYCHIATRIC:  Normal affect   ASSESSMENT:    1. Nonrheumatic mitral valve regurgitation   2. Chest pain, unspecified type    PLAN:    In order of problems listed above:  Mild mitral agitation, echo 05/2023.  EF 55 to 60% plan serial monitoring with echocardiograms, repeat echo in about 3 years. Chest pain, GERD.  Was previously on Protonix, stopped taking due to feeling nauseous.  Can try OTC omeprazole.  Recommend following up with primary care physician and or GI for additional input.  Follow-up in 1  year.  Medication Adjustments/Labs and Tests Ordered: Current medicines are reviewed at length with the patient today.  Concerns regarding medicines are outlined above.  Orders Placed This Encounter  Procedures   EKG 12-Lead   No orders of the defined types were placed in this encounter.   Patient Instructions  Medication Instructions:  No changes at this time.  *If you need a refill on your cardiac medications before your next appointment, please call your pharmacy*  Lab Work: None  If you have labs (blood work) drawn today and your tests are completely normal, you will receive your results only by: MyChart Message (if you have MyChart) OR A paper copy in the mail If you have any lab test that is abnormal or we need to change your treatment, we will call you to review the results.  Testing/Procedures: None  Follow-Up: At [facility], you and your health needs are our priority.  As part of our continuing mission to provide you with exceptional heart care, our providers are all part of one team.  This team includes your primary Cardiologist (physician) and Advanced Practice Providers or APPs (Physician Assistants and Nurse Practitioners) who all work together to provide you with the care you need, when you need it.  Your next appointment:   1 year(s)  Provider:   You may see Constancia Delton, MD or one of the following Advanced Practice Providers on your designated Care Team:   Laneta Pintos, NP Gildardo Labrador, PA-C Varney Gentleman, PA-C Cadence Gennaro Khat, PA-C Ronald Cockayne, NP Morey Ar, NP    Signed, Constancia Delton, MD  12/03/2023 12:20 PM    [facility]

## 2023-12-03 NOTE — Patient Instructions (Signed)
 Medication Instructions:  No changes at this time.   *If you need a refill on your cardiac medications before your next appointment, please call your pharmacy*  Lab Work: None  If you have labs (blood work) drawn today and your tests are completely normal, you will receive your results only by: MyChart Message (if you have MyChart) OR A paper copy in the mail If you have any lab test that is abnormal or we need to change your treatment, we will call you to review the results.  Testing/Procedures: None  Follow-Up: At [facility], you and your health needs are our priority.  As part of our continuing mission to provide you with exceptional heart care, our providers are all part of one team.  This team includes your primary Cardiologist (physician) and Advanced Practice Providers or APPs (Physician Assistants and Nurse Practitioners) who all work together to provide you with the care you need, when you need it.  Your next appointment:   1 year(s)  Provider:   You may see Debbe Odea, MD or one of the following Advanced Practice Providers on your designated Care Team:   Nicolasa Ducking, NP Ames Dura, PA-C Eula Listen, PA-C [facility], PA-C Charlsie Quest, NP Carlos Levering, NP

## 2024-05-18 ENCOUNTER — Other Ambulatory Visit: Payer: Self-pay | Admitting: Family Medicine

## 2024-05-18 DIAGNOSIS — Z1231 Encounter for screening mammogram for malignant neoplasm of breast: Secondary | ICD-10-CM

## 2024-06-08 ENCOUNTER — Ambulatory Visit
Admission: RE | Admit: 2024-06-08 | Discharge: 2024-06-08 | Disposition: A | Discharge status: Home / Self Care | Source: Ambulatory Visit | Attending: Family Medicine | Admitting: Family Medicine

## 2024-06-08 DIAGNOSIS — Z1231 Encounter for screening mammogram for malignant neoplasm of breast: Secondary | ICD-10-CM | POA: Diagnosis present

## 2024-08-03 ENCOUNTER — Other Ambulatory Visit: Payer: Self-pay | Admitting: Family Medicine

## 2024-08-03 DIAGNOSIS — M5416 Radiculopathy, lumbar region: Secondary | ICD-10-CM

## 2024-08-13 ENCOUNTER — Ambulatory Visit
Admission: RE | Admit: 2024-08-13 | Discharge: 2024-08-13 | Disposition: A | Source: Ambulatory Visit | Attending: Family Medicine | Admitting: Family Medicine

## 2024-08-13 DIAGNOSIS — M5416 Radiculopathy, lumbar region: Secondary | ICD-10-CM
